# Patient Record
Sex: Female | Born: 1935 | Race: White | Hispanic: No | Smoking: Never smoker
Health system: Southern US, Community
[De-identification: ages and names within clinical notes are randomized; demographics above are authoritative.]

## PROBLEM LIST (undated history)

## (undated) DIAGNOSIS — Z972 Presence of dental prosthetic device (complete) (partial): Secondary | ICD-10-CM

## (undated) DIAGNOSIS — C801 Malignant (primary) neoplasm, unspecified: Secondary | ICD-10-CM

## (undated) DIAGNOSIS — K219 Gastro-esophageal reflux disease without esophagitis: Secondary | ICD-10-CM

## (undated) DIAGNOSIS — I639 Cerebral infarction, unspecified: Secondary | ICD-10-CM

## (undated) DIAGNOSIS — E119 Type 2 diabetes mellitus without complications: Secondary | ICD-10-CM

---

## 2014-08-29 LAB — COMPREHENSIVE METABOLIC PANEL
ALK PHOS: 150 U/L — AB
Albumin: 3.4 g/dL (ref 3.4–5.0)
Anion Gap: 9 (ref 7–16)
BUN: 20 mg/dL — ABNORMAL HIGH (ref 7–18)
Bilirubin,Total: 0.4 mg/dL (ref 0.2–1.0)
CALCIUM: 9 mg/dL (ref 8.5–10.1)
Chloride: 96 mmol/L — ABNORMAL LOW (ref 98–107)
Co2: 29 mmol/L (ref 21–32)
Creatinine: 1.22 mg/dL (ref 0.60–1.30)
EGFR (Non-African Amer.): 45 — ABNORMAL LOW
GFR CALC AF AMER: 55 — AB
Glucose: 281 mg/dL — ABNORMAL HIGH (ref 65–99)
Osmolality: 281 (ref 275–301)
Potassium: 3.5 mmol/L (ref 3.5–5.1)
SGOT(AST): 21 U/L (ref 15–37)
SGPT (ALT): 22 U/L
SODIUM: 134 mmol/L — AB (ref 136–145)
Total Protein: 7.8 g/dL (ref 6.4–8.2)

## 2014-08-29 LAB — CBC WITH DIFFERENTIAL/PLATELET
Basophil #: 0 10*3/uL (ref 0.0–0.1)
Basophil %: 0.4 %
EOS ABS: 0 10*3/uL (ref 0.0–0.7)
Eosinophil %: 0.5 %
HCT: 44.8 % (ref 35.0–47.0)
HGB: 14.7 g/dL (ref 12.0–16.0)
LYMPHS PCT: 11.7 %
Lymphocyte #: 0.9 10*3/uL — ABNORMAL LOW (ref 1.0–3.6)
MCH: 29.1 pg (ref 26.0–34.0)
MCHC: 32.8 g/dL (ref 32.0–36.0)
MCV: 89 fL (ref 80–100)
MONOS PCT: 3.9 %
Monocyte #: 0.3 x10 3/mm (ref 0.2–0.9)
NEUTROS ABS: 6.8 10*3/uL — AB (ref 1.4–6.5)
NEUTROS PCT: 83.5 %
PLATELETS: 230 10*3/uL (ref 150–440)
RBC: 5.05 10*6/uL (ref 3.80–5.20)
RDW: 14.8 % — AB (ref 11.5–14.5)
WBC: 8.1 10*3/uL (ref 3.6–11.0)

## 2014-08-29 LAB — URINALYSIS, COMPLETE
BACTERIA: NONE SEEN
Bilirubin,UR: NEGATIVE
Blood: NEGATIVE
Leukocyte Esterase: NEGATIVE
Nitrite: NEGATIVE
Ph: 7 (ref 4.5–8.0)
RBC,UR: 3 /HPF (ref 0–5)
SQUAMOUS EPITHELIAL: NONE SEEN
Specific Gravity: 1.012 (ref 1.003–1.030)

## 2014-08-29 LAB — LIPASE, BLOOD: Lipase: 202 U/L (ref 73–393)

## 2014-08-29 LAB — TROPONIN I

## 2014-08-30 ENCOUNTER — Inpatient Hospital Stay: Payer: Self-pay | Admitting: Internal Medicine

## 2014-08-30 LAB — CBC WITH DIFFERENTIAL/PLATELET
Basophil #: 0 10*3/uL (ref 0.0–0.1)
Basophil %: 0.7 %
Eosinophil #: 0.1 10*3/uL (ref 0.0–0.7)
Eosinophil %: 0.9 %
HCT: 41.2 % (ref 35.0–47.0)
HGB: 13.7 g/dL (ref 12.0–16.0)
LYMPHS ABS: 1.7 10*3/uL (ref 1.0–3.6)
Lymphocyte %: 23.5 %
MCH: 29.3 pg (ref 26.0–34.0)
MCHC: 33.3 g/dL (ref 32.0–36.0)
MCV: 88 fL (ref 80–100)
Monocyte #: 0.6 x10 3/mm (ref 0.2–0.9)
Monocyte %: 8.7 %
NEUTROS ABS: 4.9 10*3/uL (ref 1.4–6.5)
Neutrophil %: 66.2 %
PLATELETS: 270 10*3/uL (ref 150–440)
RBC: 4.67 10*6/uL (ref 3.80–5.20)
RDW: 15 % — AB (ref 11.5–14.5)
WBC: 7.5 10*3/uL (ref 3.6–11.0)

## 2014-08-30 LAB — BASIC METABOLIC PANEL
Anion Gap: 10 (ref 7–16)
BUN: 19 mg/dL — AB (ref 7–18)
CALCIUM: 8.3 mg/dL — AB (ref 8.5–10.1)
CO2: 27 mmol/L (ref 21–32)
Chloride: 101 mmol/L (ref 98–107)
Creatinine: 1.25 mg/dL (ref 0.60–1.30)
EGFR (African American): 53 — ABNORMAL LOW
EGFR (Non-African Amer.): 44 — ABNORMAL LOW
Glucose: 232 mg/dL — ABNORMAL HIGH (ref 65–99)
OSMOLALITY: 285 (ref 275–301)
POTASSIUM: 3.5 mmol/L (ref 3.5–5.1)
SODIUM: 138 mmol/L (ref 136–145)

## 2014-09-04 LAB — CULTURE, BLOOD (SINGLE)

## 2015-03-13 NOTE — H&P (Signed)
PATIENT NAME:  Carrie Craig, Carrie Craig MR#:  [id number] DATE OF BIRTH:  [birth year]  DATE OF ADMISSION:  08/29/2014  REFERRING PHYSICIAN: Dr. Jacqualine Code.   FAMILY PHYSICIAN: Unknown.   REASON FOR ADMISSION: Altered mental status.   HISTORY OF PRESENT ILLNESS: The patient is an 81 year old female who presents after being dropped off by family for acute altered mental status. No family is available at this time and the patient is confused and unable to give a history. She apparently has a history of hypertension and diabetes, and was in her usual state of health until this morning. She was brought for nausea, vomiting, and confusion. In the Emergency Room, the patient is acutely confused, but in no acute distress. No vomiting noted. Initial head CT and labs were nondiagnostic. She is now admitted for further evaluation.   PAST MEDICAL HISTORY: 1.  Benign hypertension.  2.  Type 2 diabetes.   MEDICATIONS: Unknown.   ALLERGIES: Unknown.   SOCIAL HISTORY: Unknown.   FAMILY HISTORY: Unknown.   REVIEW OF SYSTEMS: Unable to obtain.   PHYSICAL EXAMINATION: GENERAL: The patient is elderly, confused, but in no acute distress.  VITAL SIGNS: Currently remarkable for a blood pressure of 179/88, heart rate 92, respiratory rate of 24, temperature of 98, saturations 96% on room air.  HEENT: Normocephalic, atraumatic. Pupils equally round and reactive to light and accommodation. Extraocular movements are intact. Sclerae are anicteric. Conjunctivae are clear. Oropharynx is clear.  NECK: Supple without JVD. No adenopathy or thyromegaly is noted.  LUNGS: Clear to auscultation and percussion without wheezes, rales or rhonchi. No dullness. Respiratory effort is normal.  CARDIAC: Regular rate and rhythm with normal S1, S2. No significant rubs, murmurs or gallops. PMI is nondisplaced. Chest wall is nontender.  ABDOMEN: Soft, nontender, with normoactive bowel sounds. No organomegaly or masses were appreciated. No hernias or  bruits were noted.  EXTREMITIES: Without clubbing, cyanosis or edema. Pulses were 2+ bilaterally.  SKIN: Warm and dry without rash or lesions.  NEUROLOGIC: Cranial nerves II through XII grossly intact. Deep tendon reflexes were symmetric. Motor and sensory exam nonfocal.  PSYCHIATRIC: The patient was alert, but disoriented to person, place, and time.   LABORATORY DATA: Glucose 281 with a BUN of 20, creatinine 1.22 with a sodium of 134, potassium 3.5 and a lipase of 202. White count 8.1 with a hemoglobin of 14.7. Plain films of the abdomen were unremarkable. CT scan of the head revealed no acute abnormality.   ASSESSMENT: 1.  Acute mental status changes, worrisome for transient ischemic attack versus stroke.  2.  Benign hypertension.  3.  Type 2 diabetes.  4.  Dehydration.   PLAN: The patient will be observed on telemetry with aspirin and Lovenox. Will obtain neurology consult and MRI of the brain. Neuro checks q. 4 hours. We will follow her sugars closely. We will send off a urine and a urine culture. Begin empiric IV antibiotics with IV fluids. Follow up routine labs in the morning. Further treatment and evaluation will depend upon the patient's progress.   TOTAL TIME SPENT ON THIS PATIENT: 45 minutes.    ____________________________ Leonie Douglas Doy Hutching, MD jds:at D: 08/29/2014 18:43:08 ET T: 08/29/2014 19:02:17 ET JOB#: [id number]  cc: Leonie Douglas. Doy Hutching, MD, <Dictator> Nigel Ericsson Lennice Sites MD ELECTRONICALLY SIGNED 08/30/2014 10:28

## 2015-03-13 NOTE — Discharge Summary (Signed)
PATIENT NAME:  Carrie Craig, Carrie Craig MR#:  [id number] DATE OF BIRTH:  Nov 05, 1932  DATE OF ADMISSION:  08/30/2014 DATE OF DISCHARGE:  08/31/2014  PRIMARY CARE PHYSICIAN: None local.  DISCHARGE DIAGNOSES: 1. Altered mental status possibly medication induced.  2. Dehydration.  3. Hypertension.  4. Diabetes 2.   CONDITION: Stable.   CODE STATUS: Full Code.   HOME MEDICATIONS: Please refer to the medication reconciliation list. Tramadol was discontinued.   DIET: Low-sodium, low-fat, low-cholesterol, ADA diet.   ACTIVITY: As tolerated.   FOLLOW-UP CARE: Follow up with PCP within 1 to 2 weeks.   REASON FOR ADMISSION: Altered mental status.   HOSPITAL COURSE: The patient is an 81 year old female with a history of hypertension, diabetes 2, who was sent to the ED due to confusion and altered mental status. The patient was in her usual state of health. In addition, the patient had nausea and vomiting. The patient's CAT scan of the head was nondiagnostic. For detailed history and physical examination, please refer to the admission note dictated by Dr. Doy Hutching.  On admission date, the patient's glucose was 281, BUN 20, creatinine 1.22, sodium 134, potassium 3.5. WBC 8.1, hemoglobin 14.7. CAT scan of head: No acute abnormality. The patient was admitted for altered mental status worrisome for TIA versus a stroke. After admission, the patient has been treated with aspirin and a statin. We repeated an MRI, which was negative for CVA.   The patient's mental status has much improved. Today she is alert, awake, oriented, in no acute distress. Physical examinations did not show any intracranial deficit. The patient said she is taking Tramadol. I checked the patient's medication list. In addition to tramadol, the patient also is taking citalopram. The combination of the 2 medications can cause altered mental status, confusion, nausea and vomiting. The patient's altered mental status is highly possibly due to  medication, so I advised the patient to avoid tramadol.   Diabetes. The patient's diabetes has been treated with sliding scale and Levemir. The patient needs to resume glipizide that after discharge. She needs to follow up with PCP to adjust the diabetes medication to control the blood sugar.   The patient has no complaints. Vital signs are stable. She is clinically stable and will be discharged to home today.   I discussed the patient's discharge plan with the patient, nurse and case manager.   TIME SPENT: About 35 minutes.    ____________________________ Demetrios Loll, MD qc:JT D: 08/31/2014 11:01:49 ET T: 08/31/2014 11:46:43 ET JOB#: [id number]  cc: Demetrios Loll, MD, <Dictator> Demetrios Loll MD ELECTRONICALLY SIGNED 08/31/2014 13:18

## 2015-07-22 DIAGNOSIS — E1165 Type 2 diabetes mellitus with hyperglycemia: Secondary | ICD-10-CM | POA: Diagnosis not present

## 2015-07-28 DIAGNOSIS — E876 Hypokalemia: Secondary | ICD-10-CM | POA: Diagnosis not present

## 2015-07-28 DIAGNOSIS — R410 Disorientation, unspecified: Secondary | ICD-10-CM | POA: Diagnosis not present

## 2015-07-28 DIAGNOSIS — R404 Transient alteration of awareness: Secondary | ICD-10-CM | POA: Diagnosis not present

## 2015-07-28 DIAGNOSIS — R402361 Coma scale, best motor response, obeys commands, in the field [EMT or ambulance]: Secondary | ICD-10-CM | POA: Diagnosis not present

## 2015-07-28 DIAGNOSIS — R402141 Coma scale, eyes open, spontaneous, in the field [EMT or ambulance]: Secondary | ICD-10-CM | POA: Diagnosis not present

## 2015-07-28 DIAGNOSIS — G9340 Encephalopathy, unspecified: Secondary | ICD-10-CM | POA: Diagnosis not present

## 2015-07-28 DIAGNOSIS — R739 Hyperglycemia, unspecified: Secondary | ICD-10-CM | POA: Diagnosis not present

## 2015-07-28 DIAGNOSIS — R402241 Coma scale, best verbal response, confused conversation, in the field [EMT or ambulance]: Secondary | ICD-10-CM | POA: Diagnosis not present

## 2015-07-28 DIAGNOSIS — E1165 Type 2 diabetes mellitus with hyperglycemia: Secondary | ICD-10-CM | POA: Diagnosis not present

## 2015-07-29 DIAGNOSIS — N184 Chronic kidney disease, stage 4 (severe): Secondary | ICD-10-CM | POA: Diagnosis not present

## 2015-07-29 DIAGNOSIS — F0390 Unspecified dementia without behavioral disturbance: Secondary | ICD-10-CM | POA: Diagnosis not present

## 2015-07-29 DIAGNOSIS — E1165 Type 2 diabetes mellitus with hyperglycemia: Secondary | ICD-10-CM | POA: Diagnosis not present

## 2015-07-29 DIAGNOSIS — M1990 Unspecified osteoarthritis, unspecified site: Secondary | ICD-10-CM | POA: Diagnosis not present

## 2015-07-29 DIAGNOSIS — I340 Nonrheumatic mitral (valve) insufficiency: Secondary | ICD-10-CM | POA: Diagnosis not present

## 2015-07-29 DIAGNOSIS — R509 Fever, unspecified: Secondary | ICD-10-CM | POA: Diagnosis not present

## 2015-07-29 DIAGNOSIS — R404 Transient alteration of awareness: Secondary | ICD-10-CM | POA: Diagnosis not present

## 2015-07-29 DIAGNOSIS — I679 Cerebrovascular disease, unspecified: Secondary | ICD-10-CM | POA: Diagnosis not present

## 2015-07-29 DIAGNOSIS — R54 Age-related physical debility: Secondary | ICD-10-CM | POA: Diagnosis not present

## 2015-07-29 DIAGNOSIS — R7989 Other specified abnormal findings of blood chemistry: Secondary | ICD-10-CM | POA: Diagnosis not present

## 2015-07-29 DIAGNOSIS — I6990 Unspecified sequelae of unspecified cerebrovascular disease: Secondary | ICD-10-CM | POA: Diagnosis not present

## 2015-07-29 DIAGNOSIS — R1310 Dysphagia, unspecified: Secondary | ICD-10-CM | POA: Diagnosis not present

## 2015-07-29 DIAGNOSIS — R4182 Altered mental status, unspecified: Secondary | ICD-10-CM | POA: Diagnosis not present

## 2015-07-29 DIAGNOSIS — R279 Unspecified lack of coordination: Secondary | ICD-10-CM | POA: Diagnosis not present

## 2015-07-29 DIAGNOSIS — E782 Mixed hyperlipidemia: Secondary | ICD-10-CM | POA: Diagnosis not present

## 2015-07-29 DIAGNOSIS — R0602 Shortness of breath: Secondary | ICD-10-CM | POA: Diagnosis not present

## 2015-07-29 DIAGNOSIS — M62838 Other muscle spasm: Secondary | ICD-10-CM | POA: Diagnosis not present

## 2015-07-29 DIAGNOSIS — R410 Disorientation, unspecified: Secondary | ICD-10-CM | POA: Diagnosis not present

## 2015-07-29 DIAGNOSIS — R1312 Dysphagia, oropharyngeal phase: Secondary | ICD-10-CM | POA: Diagnosis not present

## 2015-07-29 DIAGNOSIS — K219 Gastro-esophageal reflux disease without esophagitis: Secondary | ICD-10-CM | POA: Diagnosis not present

## 2015-07-29 DIAGNOSIS — Z8673 Personal history of transient ischemic attack (TIA), and cerebral infarction without residual deficits: Secondary | ICD-10-CM | POA: Diagnosis not present

## 2015-07-29 DIAGNOSIS — G9340 Encephalopathy, unspecified: Secondary | ICD-10-CM | POA: Diagnosis not present

## 2015-07-29 DIAGNOSIS — E876 Hypokalemia: Secondary | ICD-10-CM | POA: Diagnosis not present

## 2015-07-29 DIAGNOSIS — F339 Major depressive disorder, recurrent, unspecified: Secondary | ICD-10-CM | POA: Diagnosis not present

## 2015-07-29 DIAGNOSIS — I639 Cerebral infarction, unspecified: Secondary | ICD-10-CM | POA: Diagnosis not present

## 2015-07-29 DIAGNOSIS — I10 Essential (primary) hypertension: Secondary | ICD-10-CM | POA: Diagnosis not present

## 2015-07-29 DIAGNOSIS — N179 Acute kidney failure, unspecified: Secondary | ICD-10-CM | POA: Diagnosis not present

## 2015-07-29 DIAGNOSIS — M6281 Muscle weakness (generalized): Secondary | ICD-10-CM | POA: Diagnosis not present

## 2015-07-29 DIAGNOSIS — Z7401 Bed confinement status: Secondary | ICD-10-CM | POA: Diagnosis not present

## 2015-07-29 DIAGNOSIS — H547 Unspecified visual loss: Secondary | ICD-10-CM | POA: Diagnosis not present

## 2015-07-29 DIAGNOSIS — R41841 Cognitive communication deficit: Secondary | ICD-10-CM | POA: Diagnosis not present

## 2015-07-29 DIAGNOSIS — F418 Other specified anxiety disorders: Secondary | ICD-10-CM | POA: Diagnosis not present

## 2015-07-29 DIAGNOSIS — G459 Transient cerebral ischemic attack, unspecified: Secondary | ICD-10-CM | POA: Diagnosis not present

## 2015-07-29 DIAGNOSIS — E119 Type 2 diabetes mellitus without complications: Secondary | ICD-10-CM | POA: Diagnosis not present

## 2015-07-29 DIAGNOSIS — R739 Hyperglycemia, unspecified: Secondary | ICD-10-CM | POA: Diagnosis not present

## 2015-07-30 DIAGNOSIS — Z8673 Personal history of transient ischemic attack (TIA), and cerebral infarction without residual deficits: Secondary | ICD-10-CM | POA: Diagnosis not present

## 2015-07-30 DIAGNOSIS — G9340 Encephalopathy, unspecified: Secondary | ICD-10-CM | POA: Diagnosis not present

## 2015-07-30 DIAGNOSIS — R739 Hyperglycemia, unspecified: Secondary | ICD-10-CM | POA: Diagnosis not present

## 2015-07-30 DIAGNOSIS — R4182 Altered mental status, unspecified: Secondary | ICD-10-CM | POA: Diagnosis not present

## 2015-07-30 DIAGNOSIS — E1165 Type 2 diabetes mellitus with hyperglycemia: Secondary | ICD-10-CM | POA: Diagnosis not present

## 2015-07-30 DIAGNOSIS — R410 Disorientation, unspecified: Secondary | ICD-10-CM | POA: Diagnosis not present

## 2015-07-31 DIAGNOSIS — R410 Disorientation, unspecified: Secondary | ICD-10-CM | POA: Diagnosis not present

## 2015-07-31 DIAGNOSIS — G9340 Encephalopathy, unspecified: Secondary | ICD-10-CM | POA: Diagnosis not present

## 2015-07-31 DIAGNOSIS — R739 Hyperglycemia, unspecified: Secondary | ICD-10-CM | POA: Diagnosis not present

## 2015-07-31 DIAGNOSIS — E1165 Type 2 diabetes mellitus with hyperglycemia: Secondary | ICD-10-CM | POA: Diagnosis not present

## 2015-07-31 DIAGNOSIS — Z8673 Personal history of transient ischemic attack (TIA), and cerebral infarction without residual deficits: Secondary | ICD-10-CM | POA: Diagnosis not present

## 2015-08-01 DIAGNOSIS — Z8673 Personal history of transient ischemic attack (TIA), and cerebral infarction without residual deficits: Secondary | ICD-10-CM | POA: Diagnosis not present

## 2015-08-01 DIAGNOSIS — G9340 Encephalopathy, unspecified: Secondary | ICD-10-CM | POA: Diagnosis not present

## 2015-08-01 DIAGNOSIS — E1165 Type 2 diabetes mellitus with hyperglycemia: Secondary | ICD-10-CM | POA: Diagnosis not present

## 2015-08-01 DIAGNOSIS — R739 Hyperglycemia, unspecified: Secondary | ICD-10-CM | POA: Diagnosis not present

## 2015-08-01 DIAGNOSIS — R410 Disorientation, unspecified: Secondary | ICD-10-CM | POA: Diagnosis not present

## 2015-08-02 DIAGNOSIS — G9340 Encephalopathy, unspecified: Secondary | ICD-10-CM | POA: Diagnosis not present

## 2015-08-02 DIAGNOSIS — R4182 Altered mental status, unspecified: Secondary | ICD-10-CM | POA: Diagnosis not present

## 2015-08-02 DIAGNOSIS — G459 Transient cerebral ischemic attack, unspecified: Secondary | ICD-10-CM | POA: Diagnosis not present

## 2015-08-02 DIAGNOSIS — R410 Disorientation, unspecified: Secondary | ICD-10-CM | POA: Diagnosis not present

## 2015-08-03 DIAGNOSIS — I340 Nonrheumatic mitral (valve) insufficiency: Secondary | ICD-10-CM | POA: Diagnosis not present

## 2015-08-03 DIAGNOSIS — R1310 Dysphagia, unspecified: Secondary | ICD-10-CM | POA: Diagnosis not present

## 2015-08-05 DIAGNOSIS — R0602 Shortness of breath: Secondary | ICD-10-CM | POA: Diagnosis not present

## 2015-08-06 DIAGNOSIS — F0390 Unspecified dementia without behavioral disturbance: Secondary | ICD-10-CM | POA: Diagnosis not present

## 2015-08-06 DIAGNOSIS — E1165 Type 2 diabetes mellitus with hyperglycemia: Secondary | ICD-10-CM | POA: Diagnosis not present

## 2015-08-06 DIAGNOSIS — R630 Anorexia: Secondary | ICD-10-CM | POA: Diagnosis not present

## 2015-08-06 DIAGNOSIS — E782 Mixed hyperlipidemia: Secondary | ICD-10-CM | POA: Diagnosis not present

## 2015-08-06 DIAGNOSIS — R1312 Dysphagia, oropharyngeal phase: Secondary | ICD-10-CM | POA: Diagnosis not present

## 2015-08-06 DIAGNOSIS — I6990 Unspecified sequelae of unspecified cerebrovascular disease: Secondary | ICD-10-CM | POA: Diagnosis not present

## 2015-08-06 DIAGNOSIS — R279 Unspecified lack of coordination: Secondary | ICD-10-CM | POA: Diagnosis not present

## 2015-08-06 DIAGNOSIS — E119 Type 2 diabetes mellitus without complications: Secondary | ICD-10-CM | POA: Diagnosis not present

## 2015-08-06 DIAGNOSIS — E876 Hypokalemia: Secondary | ICD-10-CM | POA: Diagnosis not present

## 2015-08-06 DIAGNOSIS — I10 Essential (primary) hypertension: Secondary | ICD-10-CM | POA: Diagnosis not present

## 2015-08-06 DIAGNOSIS — R7989 Other specified abnormal findings of blood chemistry: Secondary | ICD-10-CM | POA: Diagnosis not present

## 2015-08-06 DIAGNOSIS — N184 Chronic kidney disease, stage 4 (severe): Secondary | ICD-10-CM | POA: Diagnosis not present

## 2015-08-06 DIAGNOSIS — M6281 Muscle weakness (generalized): Secondary | ICD-10-CM | POA: Diagnosis not present

## 2015-08-06 DIAGNOSIS — R54 Age-related physical debility: Secondary | ICD-10-CM | POA: Diagnosis not present

## 2015-08-06 DIAGNOSIS — F39 Unspecified mood [affective] disorder: Secondary | ICD-10-CM | POA: Diagnosis not present

## 2015-08-06 DIAGNOSIS — Z7401 Bed confinement status: Secondary | ICD-10-CM | POA: Diagnosis not present

## 2015-08-06 DIAGNOSIS — Z5181 Encounter for therapeutic drug level monitoring: Secondary | ICD-10-CM | POA: Diagnosis not present

## 2015-08-06 DIAGNOSIS — F339 Major depressive disorder, recurrent, unspecified: Secondary | ICD-10-CM | POA: Diagnosis not present

## 2015-08-06 DIAGNOSIS — K219 Gastro-esophageal reflux disease without esophagitis: Secondary | ICD-10-CM | POA: Diagnosis not present

## 2015-08-06 DIAGNOSIS — G9340 Encephalopathy, unspecified: Secondary | ICD-10-CM | POA: Diagnosis not present

## 2015-08-06 DIAGNOSIS — M62838 Other muscle spasm: Secondary | ICD-10-CM | POA: Diagnosis not present

## 2015-08-06 DIAGNOSIS — N179 Acute kidney failure, unspecified: Secondary | ICD-10-CM | POA: Diagnosis not present

## 2015-08-06 DIAGNOSIS — F419 Anxiety disorder, unspecified: Secondary | ICD-10-CM | POA: Diagnosis not present

## 2015-08-06 DIAGNOSIS — R41841 Cognitive communication deficit: Secondary | ICD-10-CM | POA: Diagnosis not present

## 2015-08-06 DIAGNOSIS — F418 Other specified anxiety disorders: Secondary | ICD-10-CM | POA: Diagnosis not present

## 2015-08-06 DIAGNOSIS — I679 Cerebrovascular disease, unspecified: Secondary | ICD-10-CM | POA: Diagnosis not present

## 2015-08-06 DIAGNOSIS — F320 Major depressive disorder, single episode, mild: Secondary | ICD-10-CM | POA: Diagnosis not present

## 2015-08-20 DIAGNOSIS — N184 Chronic kidney disease, stage 4 (severe): Secondary | ICD-10-CM | POA: Diagnosis not present

## 2015-08-20 DIAGNOSIS — F418 Other specified anxiety disorders: Secondary | ICD-10-CM | POA: Diagnosis not present

## 2015-08-20 DIAGNOSIS — K219 Gastro-esophageal reflux disease without esophagitis: Secondary | ICD-10-CM | POA: Diagnosis not present

## 2015-08-20 DIAGNOSIS — I6990 Unspecified sequelae of unspecified cerebrovascular disease: Secondary | ICD-10-CM | POA: Diagnosis not present

## 2015-08-20 DIAGNOSIS — I10 Essential (primary) hypertension: Secondary | ICD-10-CM | POA: Diagnosis not present

## 2015-08-20 DIAGNOSIS — E119 Type 2 diabetes mellitus without complications: Secondary | ICD-10-CM | POA: Diagnosis not present

## 2015-08-27 DIAGNOSIS — N179 Acute kidney failure, unspecified: Secondary | ICD-10-CM | POA: Diagnosis not present

## 2015-08-27 DIAGNOSIS — F0390 Unspecified dementia without behavioral disturbance: Secondary | ICD-10-CM | POA: Diagnosis not present

## 2015-08-27 DIAGNOSIS — R630 Anorexia: Secondary | ICD-10-CM | POA: Diagnosis not present

## 2015-09-03 DIAGNOSIS — I10 Essential (primary) hypertension: Secondary | ICD-10-CM | POA: Diagnosis not present

## 2015-09-03 DIAGNOSIS — I6990 Unspecified sequelae of unspecified cerebrovascular disease: Secondary | ICD-10-CM | POA: Diagnosis not present

## 2015-09-03 DIAGNOSIS — F418 Other specified anxiety disorders: Secondary | ICD-10-CM | POA: Diagnosis not present

## 2015-09-11 DIAGNOSIS — Z9181 History of falling: Secondary | ICD-10-CM | POA: Diagnosis not present

## 2015-09-11 DIAGNOSIS — I6930 Unspecified sequelae of cerebral infarction: Secondary | ICD-10-CM | POA: Diagnosis not present

## 2015-09-11 DIAGNOSIS — Z7901 Long term (current) use of anticoagulants: Secondary | ICD-10-CM | POA: Diagnosis not present

## 2015-09-11 DIAGNOSIS — M6281 Muscle weakness (generalized): Secondary | ICD-10-CM | POA: Diagnosis not present

## 2015-09-11 DIAGNOSIS — E1165 Type 2 diabetes mellitus with hyperglycemia: Secondary | ICD-10-CM | POA: Diagnosis not present

## 2015-09-11 DIAGNOSIS — I10 Essential (primary) hypertension: Secondary | ICD-10-CM | POA: Diagnosis not present

## 2015-09-11 DIAGNOSIS — R2689 Other abnormalities of gait and mobility: Secondary | ICD-10-CM | POA: Diagnosis not present

## 2015-09-11 DIAGNOSIS — Z794 Long term (current) use of insulin: Secondary | ICD-10-CM | POA: Diagnosis not present

## 2015-09-11 DIAGNOSIS — F339 Major depressive disorder, recurrent, unspecified: Secondary | ICD-10-CM | POA: Diagnosis not present

## 2015-09-13 DIAGNOSIS — F339 Major depressive disorder, recurrent, unspecified: Secondary | ICD-10-CM | POA: Diagnosis not present

## 2015-09-13 DIAGNOSIS — I10 Essential (primary) hypertension: Secondary | ICD-10-CM | POA: Diagnosis not present

## 2015-09-13 DIAGNOSIS — R2689 Other abnormalities of gait and mobility: Secondary | ICD-10-CM | POA: Diagnosis not present

## 2015-09-13 DIAGNOSIS — Z7901 Long term (current) use of anticoagulants: Secondary | ICD-10-CM | POA: Diagnosis not present

## 2015-09-13 DIAGNOSIS — M6281 Muscle weakness (generalized): Secondary | ICD-10-CM | POA: Diagnosis not present

## 2015-09-13 DIAGNOSIS — Z9181 History of falling: Secondary | ICD-10-CM | POA: Diagnosis not present

## 2015-09-13 DIAGNOSIS — E1165 Type 2 diabetes mellitus with hyperglycemia: Secondary | ICD-10-CM | POA: Diagnosis not present

## 2015-09-13 DIAGNOSIS — I6930 Unspecified sequelae of cerebral infarction: Secondary | ICD-10-CM | POA: Diagnosis not present

## 2015-09-13 DIAGNOSIS — Z794 Long term (current) use of insulin: Secondary | ICD-10-CM | POA: Diagnosis not present

## 2015-09-15 DIAGNOSIS — I6930 Unspecified sequelae of cerebral infarction: Secondary | ICD-10-CM | POA: Diagnosis not present

## 2015-09-15 DIAGNOSIS — Z7901 Long term (current) use of anticoagulants: Secondary | ICD-10-CM | POA: Diagnosis not present

## 2015-09-15 DIAGNOSIS — Z9181 History of falling: Secondary | ICD-10-CM | POA: Diagnosis not present

## 2015-09-15 DIAGNOSIS — I10 Essential (primary) hypertension: Secondary | ICD-10-CM | POA: Diagnosis not present

## 2015-09-15 DIAGNOSIS — R2689 Other abnormalities of gait and mobility: Secondary | ICD-10-CM | POA: Diagnosis not present

## 2015-09-15 DIAGNOSIS — M6281 Muscle weakness (generalized): Secondary | ICD-10-CM | POA: Diagnosis not present

## 2015-09-15 DIAGNOSIS — E1165 Type 2 diabetes mellitus with hyperglycemia: Secondary | ICD-10-CM | POA: Diagnosis not present

## 2015-09-15 DIAGNOSIS — F339 Major depressive disorder, recurrent, unspecified: Secondary | ICD-10-CM | POA: Diagnosis not present

## 2015-09-15 DIAGNOSIS — Z794 Long term (current) use of insulin: Secondary | ICD-10-CM | POA: Diagnosis not present

## 2015-09-16 DIAGNOSIS — R2689 Other abnormalities of gait and mobility: Secondary | ICD-10-CM | POA: Diagnosis not present

## 2015-09-16 DIAGNOSIS — Z7901 Long term (current) use of anticoagulants: Secondary | ICD-10-CM | POA: Diagnosis not present

## 2015-09-16 DIAGNOSIS — M6281 Muscle weakness (generalized): Secondary | ICD-10-CM | POA: Diagnosis not present

## 2015-09-16 DIAGNOSIS — E1165 Type 2 diabetes mellitus with hyperglycemia: Secondary | ICD-10-CM | POA: Diagnosis not present

## 2015-09-16 DIAGNOSIS — I10 Essential (primary) hypertension: Secondary | ICD-10-CM | POA: Diagnosis not present

## 2015-09-16 DIAGNOSIS — Z794 Long term (current) use of insulin: Secondary | ICD-10-CM | POA: Diagnosis not present

## 2015-09-16 DIAGNOSIS — I6930 Unspecified sequelae of cerebral infarction: Secondary | ICD-10-CM | POA: Diagnosis not present

## 2015-09-16 DIAGNOSIS — Z9181 History of falling: Secondary | ICD-10-CM | POA: Diagnosis not present

## 2015-09-16 DIAGNOSIS — F339 Major depressive disorder, recurrent, unspecified: Secondary | ICD-10-CM | POA: Diagnosis not present

## 2015-09-20 DIAGNOSIS — R2689 Other abnormalities of gait and mobility: Secondary | ICD-10-CM | POA: Diagnosis not present

## 2015-09-20 DIAGNOSIS — M6281 Muscle weakness (generalized): Secondary | ICD-10-CM | POA: Diagnosis not present

## 2015-09-20 DIAGNOSIS — Z9181 History of falling: Secondary | ICD-10-CM | POA: Diagnosis not present

## 2015-09-20 DIAGNOSIS — Z7901 Long term (current) use of anticoagulants: Secondary | ICD-10-CM | POA: Diagnosis not present

## 2015-09-20 DIAGNOSIS — E1165 Type 2 diabetes mellitus with hyperglycemia: Secondary | ICD-10-CM | POA: Diagnosis not present

## 2015-09-20 DIAGNOSIS — I10 Essential (primary) hypertension: Secondary | ICD-10-CM | POA: Diagnosis not present

## 2015-09-20 DIAGNOSIS — I6930 Unspecified sequelae of cerebral infarction: Secondary | ICD-10-CM | POA: Diagnosis not present

## 2015-09-20 DIAGNOSIS — Z794 Long term (current) use of insulin: Secondary | ICD-10-CM | POA: Diagnosis not present

## 2015-09-20 DIAGNOSIS — F339 Major depressive disorder, recurrent, unspecified: Secondary | ICD-10-CM | POA: Diagnosis not present

## 2015-09-21 DIAGNOSIS — Z7901 Long term (current) use of anticoagulants: Secondary | ICD-10-CM | POA: Diagnosis not present

## 2015-09-21 DIAGNOSIS — Z794 Long term (current) use of insulin: Secondary | ICD-10-CM | POA: Diagnosis not present

## 2015-09-21 DIAGNOSIS — Z9181 History of falling: Secondary | ICD-10-CM | POA: Diagnosis not present

## 2015-09-21 DIAGNOSIS — R2689 Other abnormalities of gait and mobility: Secondary | ICD-10-CM | POA: Diagnosis not present

## 2015-09-21 DIAGNOSIS — M6281 Muscle weakness (generalized): Secondary | ICD-10-CM | POA: Diagnosis not present

## 2015-09-21 DIAGNOSIS — E1165 Type 2 diabetes mellitus with hyperglycemia: Secondary | ICD-10-CM | POA: Diagnosis not present

## 2015-09-21 DIAGNOSIS — I10 Essential (primary) hypertension: Secondary | ICD-10-CM | POA: Diagnosis not present

## 2015-09-21 DIAGNOSIS — I6930 Unspecified sequelae of cerebral infarction: Secondary | ICD-10-CM | POA: Diagnosis not present

## 2015-09-21 DIAGNOSIS — F339 Major depressive disorder, recurrent, unspecified: Secondary | ICD-10-CM | POA: Diagnosis not present

## 2015-09-22 ENCOUNTER — Encounter: Payer: Self-pay | Admitting: Emergency Medicine

## 2015-09-22 ENCOUNTER — Emergency Department: Payer: Commercial Managed Care - HMO

## 2015-09-22 ENCOUNTER — Emergency Department
Admission: EM | Admit: 2015-09-22 | Discharge: 2015-09-22 | Disposition: A | Discharge status: Home / Self Care | Payer: Commercial Managed Care - HMO | Attending: Emergency Medicine | Admitting: Emergency Medicine

## 2015-09-22 DIAGNOSIS — W01198A Fall on same level from slipping, tripping and stumbling with subsequent striking against other object, initial encounter: Secondary | ICD-10-CM | POA: Insufficient documentation

## 2015-09-22 DIAGNOSIS — S42492A Other displaced fracture of lower end of left humerus, initial encounter for closed fracture: Secondary | ICD-10-CM | POA: Diagnosis not present

## 2015-09-22 DIAGNOSIS — Y9289 Other specified places as the place of occurrence of the external cause: Secondary | ICD-10-CM | POA: Insufficient documentation

## 2015-09-22 DIAGNOSIS — S42402A Unspecified fracture of lower end of left humerus, initial encounter for closed fracture: Secondary | ICD-10-CM

## 2015-09-22 DIAGNOSIS — Y998 Other external cause status: Secondary | ICD-10-CM | POA: Diagnosis not present

## 2015-09-22 DIAGNOSIS — S62606A Fracture of unspecified phalanx of right little finger, initial encounter for closed fracture: Secondary | ICD-10-CM | POA: Diagnosis not present

## 2015-09-22 DIAGNOSIS — S5002XA Contusion of left elbow, initial encounter: Secondary | ICD-10-CM | POA: Insufficient documentation

## 2015-09-22 DIAGNOSIS — S60051A Contusion of right little finger without damage to nail, initial encounter: Secondary | ICD-10-CM | POA: Insufficient documentation

## 2015-09-22 DIAGNOSIS — Y9389 Activity, other specified: Secondary | ICD-10-CM | POA: Insufficient documentation

## 2015-09-22 DIAGNOSIS — E119 Type 2 diabetes mellitus without complications: Secondary | ICD-10-CM | POA: Insufficient documentation

## 2015-09-22 DIAGNOSIS — S42441A Displaced fracture (avulsion) of medial epicondyle of right humerus, initial encounter for closed fracture: Secondary | ICD-10-CM | POA: Diagnosis not present

## 2015-09-22 DIAGNOSIS — S62616A Displaced fracture of proximal phalanx of right little finger, initial encounter for closed fracture: Secondary | ICD-10-CM | POA: Diagnosis not present

## 2015-09-22 DIAGNOSIS — S42422A Displaced comminuted supracondylar fracture without intercondylar fracture of left humerus, initial encounter for closed fracture: Secondary | ICD-10-CM | POA: Diagnosis not present

## 2015-09-22 DIAGNOSIS — S59902A Unspecified injury of left elbow, initial encounter: Secondary | ICD-10-CM | POA: Diagnosis present

## 2015-09-22 HISTORY — DX: Type 2 diabetes mellitus without complications: E11.9

## 2015-09-22 HISTORY — DX: Cerebral infarction, unspecified: I63.9

## 2015-09-22 LAB — BASIC METABOLIC PANEL
ANION GAP: 12 (ref 5–15)
BUN: 16 mg/dL (ref 6–20)
CALCIUM: 8.2 mg/dL — AB (ref 8.9–10.3)
CO2: 24 mmol/L (ref 22–32)
Chloride: 102 mmol/L (ref 101–111)
Creatinine, Ser: 1.27 mg/dL — ABNORMAL HIGH (ref 0.44–1.00)
GFR calc Af Amer: 45 mL/min — ABNORMAL LOW (ref >60)
GFR, EST NON AFRICAN AMERICAN: 38 mL/min — AB (ref >60)
GLUCOSE: 224 mg/dL — AB (ref 65–99)
POTASSIUM: 3.6 mmol/L (ref 3.5–5.1)
SODIUM: 138 mmol/L (ref 135–145)

## 2015-09-22 LAB — CBC WITH DIFFERENTIAL/PLATELET
BASOS ABS: 0.1 10*3/uL (ref 0–0.1)
Basophils Relative: 0 %
EOS ABS: 0.1 10*3/uL (ref 0–0.7)
EOS PCT: 1 %
HCT: 30.7 % — ABNORMAL LOW (ref 35.0–47.0)
Hemoglobin: 10.6 g/dL — ABNORMAL LOW (ref 12.0–16.0)
Lymphocytes Relative: 6 %
Lymphs Abs: 0.9 10*3/uL — ABNORMAL LOW (ref 1.0–3.6)
MCH: 29.2 pg (ref 26.0–34.0)
MCHC: 34.6 g/dL (ref 32.0–36.0)
MCV: 84.2 fL (ref 80.0–100.0)
MONO ABS: 0.5 10*3/uL (ref 0.2–0.9)
Monocytes Relative: 4 %
Neutro Abs: 12.3 10*3/uL — ABNORMAL HIGH (ref 1.4–6.5)
Neutrophils Relative %: 89 %
PLATELETS: 450 10*3/uL — AB (ref 150–440)
RBC: 3.65 MIL/uL — AB (ref 3.80–5.20)
RDW: 15.6 % — AB (ref 11.5–14.5)
WBC: 13.9 10*3/uL — AB (ref 3.6–11.0)

## 2015-09-22 MED ORDER — HYDROMORPHONE HCL 1 MG/ML IJ SOLN
0.5000 mg | Freq: Once | INTRAMUSCULAR | Status: AC
  Administered 2015-09-22: 0.5 mg via INTRAVENOUS
  Filled 2015-09-22: qty 1

## 2015-09-22 MED ORDER — OXYCODONE-ACETAMINOPHEN 5-325 MG PO TABS: 1.0000 | ORAL_TABLET | Freq: Four times a day (QID) | ORAL | Status: DC | PRN

## 2015-09-22 MED ORDER — ONDANSETRON HCL 4 MG/2ML IJ SOLN
4.0000 mg | Freq: Once | INTRAMUSCULAR | Status: AC
  Administered 2015-09-22: 4 mg via INTRAVENOUS
  Filled 2015-09-22: qty 2
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
## 2015-09-22 NOTE — Progress Notes (Signed)
Discussed case with Randel Pigg, PA in ER.   Patient sustained fall onto left upper extremity sustaining injuries to the left small finger and elbow.  Mr. Tamala Julian reports the injuries are closed and the patient is neurovascularly intact.  I have reviewed the plain radiographs and the CT scan.  Patient has a comminuted fracture of the distal humerus involving the medial condyle. The elbow is not dislocated.  I instructed Mr. Tamala Julian to place a posterior splint on the left elbow with a sling and splint the small finger which has a displaced proximal phalanx fracture.  Patient should follow up with me in the office within 1 week.
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
## 2015-09-23 DIAGNOSIS — F339 Major depressive disorder, recurrent, unspecified: Secondary | ICD-10-CM | POA: Diagnosis not present

## 2015-09-23 DIAGNOSIS — Z9181 History of falling: Secondary | ICD-10-CM | POA: Diagnosis not present

## 2015-09-23 DIAGNOSIS — M6281 Muscle weakness (generalized): Secondary | ICD-10-CM | POA: Diagnosis not present

## 2015-09-23 DIAGNOSIS — Z7901 Long term (current) use of anticoagulants: Secondary | ICD-10-CM | POA: Diagnosis not present

## 2015-09-23 DIAGNOSIS — I6930 Unspecified sequelae of cerebral infarction: Secondary | ICD-10-CM | POA: Diagnosis not present

## 2015-09-23 DIAGNOSIS — E1165 Type 2 diabetes mellitus with hyperglycemia: Secondary | ICD-10-CM | POA: Diagnosis not present

## 2015-09-23 DIAGNOSIS — S62616A Displaced fracture of proximal phalanx of right little finger, initial encounter for closed fracture: Secondary | ICD-10-CM | POA: Diagnosis not present

## 2015-09-23 DIAGNOSIS — S42402A Unspecified fracture of lower end of left humerus, initial encounter for closed fracture: Secondary | ICD-10-CM | POA: Diagnosis not present

## 2015-09-23 DIAGNOSIS — I10 Essential (primary) hypertension: Secondary | ICD-10-CM | POA: Diagnosis not present

## 2015-09-23 DIAGNOSIS — Z794 Long term (current) use of insulin: Secondary | ICD-10-CM | POA: Diagnosis not present

## 2015-09-23 DIAGNOSIS — R2689 Other abnormalities of gait and mobility: Secondary | ICD-10-CM | POA: Diagnosis not present

## 2015-09-24 DIAGNOSIS — E1165 Type 2 diabetes mellitus with hyperglycemia: Secondary | ICD-10-CM | POA: Diagnosis not present

## 2015-09-24 DIAGNOSIS — R2689 Other abnormalities of gait and mobility: Secondary | ICD-10-CM | POA: Diagnosis not present

## 2015-09-24 DIAGNOSIS — Z9181 History of falling: Secondary | ICD-10-CM | POA: Diagnosis not present

## 2015-09-24 DIAGNOSIS — M6281 Muscle weakness (generalized): Secondary | ICD-10-CM | POA: Diagnosis not present

## 2015-09-24 DIAGNOSIS — I6930 Unspecified sequelae of cerebral infarction: Secondary | ICD-10-CM | POA: Diagnosis not present

## 2015-09-24 DIAGNOSIS — Z7901 Long term (current) use of anticoagulants: Secondary | ICD-10-CM | POA: Diagnosis not present

## 2015-09-24 DIAGNOSIS — Z794 Long term (current) use of insulin: Secondary | ICD-10-CM | POA: Diagnosis not present

## 2015-09-24 DIAGNOSIS — I10 Essential (primary) hypertension: Secondary | ICD-10-CM | POA: Diagnosis not present

## 2015-09-24 DIAGNOSIS — F339 Major depressive disorder, recurrent, unspecified: Secondary | ICD-10-CM | POA: Diagnosis not present

## 2015-09-28 DIAGNOSIS — R2689 Other abnormalities of gait and mobility: Secondary | ICD-10-CM | POA: Diagnosis not present

## 2015-09-28 DIAGNOSIS — E1165 Type 2 diabetes mellitus with hyperglycemia: Secondary | ICD-10-CM | POA: Diagnosis not present

## 2015-09-28 DIAGNOSIS — Z794 Long term (current) use of insulin: Secondary | ICD-10-CM | POA: Diagnosis not present

## 2015-09-28 DIAGNOSIS — Z9181 History of falling: Secondary | ICD-10-CM | POA: Diagnosis not present

## 2015-09-28 DIAGNOSIS — Z7901 Long term (current) use of anticoagulants: Secondary | ICD-10-CM | POA: Diagnosis not present

## 2015-09-28 DIAGNOSIS — I6930 Unspecified sequelae of cerebral infarction: Secondary | ICD-10-CM | POA: Diagnosis not present

## 2015-09-28 DIAGNOSIS — M6281 Muscle weakness (generalized): Secondary | ICD-10-CM | POA: Diagnosis not present

## 2015-09-28 DIAGNOSIS — I10 Essential (primary) hypertension: Secondary | ICD-10-CM | POA: Diagnosis not present

## 2015-09-28 DIAGNOSIS — F339 Major depressive disorder, recurrent, unspecified: Secondary | ICD-10-CM | POA: Diagnosis not present

## 2015-09-29 DIAGNOSIS — E1165 Type 2 diabetes mellitus with hyperglycemia: Secondary | ICD-10-CM | POA: Diagnosis not present

## 2015-09-29 DIAGNOSIS — I6930 Unspecified sequelae of cerebral infarction: Secondary | ICD-10-CM | POA: Diagnosis not present

## 2015-09-29 DIAGNOSIS — Z7901 Long term (current) use of anticoagulants: Secondary | ICD-10-CM | POA: Diagnosis not present

## 2015-09-29 DIAGNOSIS — Z794 Long term (current) use of insulin: Secondary | ICD-10-CM | POA: Diagnosis not present

## 2015-09-29 DIAGNOSIS — I10 Essential (primary) hypertension: Secondary | ICD-10-CM | POA: Diagnosis not present

## 2015-09-29 DIAGNOSIS — Z9181 History of falling: Secondary | ICD-10-CM | POA: Diagnosis not present

## 2015-09-29 DIAGNOSIS — R2689 Other abnormalities of gait and mobility: Secondary | ICD-10-CM | POA: Diagnosis not present

## 2015-09-29 DIAGNOSIS — F339 Major depressive disorder, recurrent, unspecified: Secondary | ICD-10-CM | POA: Diagnosis not present

## 2015-09-29 DIAGNOSIS — M6281 Muscle weakness (generalized): Secondary | ICD-10-CM | POA: Diagnosis not present

## 2015-09-30 DIAGNOSIS — I10 Essential (primary) hypertension: Secondary | ICD-10-CM | POA: Diagnosis not present

## 2015-09-30 DIAGNOSIS — Z794 Long term (current) use of insulin: Secondary | ICD-10-CM | POA: Diagnosis not present

## 2015-09-30 DIAGNOSIS — F339 Major depressive disorder, recurrent, unspecified: Secondary | ICD-10-CM | POA: Diagnosis not present

## 2015-09-30 DIAGNOSIS — Z7901 Long term (current) use of anticoagulants: Secondary | ICD-10-CM | POA: Diagnosis not present

## 2015-09-30 DIAGNOSIS — I6930 Unspecified sequelae of cerebral infarction: Secondary | ICD-10-CM | POA: Diagnosis not present

## 2015-09-30 DIAGNOSIS — R2689 Other abnormalities of gait and mobility: Secondary | ICD-10-CM | POA: Diagnosis not present

## 2015-09-30 DIAGNOSIS — E1165 Type 2 diabetes mellitus with hyperglycemia: Secondary | ICD-10-CM | POA: Diagnosis not present

## 2015-09-30 DIAGNOSIS — Z9181 History of falling: Secondary | ICD-10-CM | POA: Diagnosis not present

## 2015-09-30 DIAGNOSIS — M6281 Muscle weakness (generalized): Secondary | ICD-10-CM | POA: Diagnosis not present

## 2015-10-01 DIAGNOSIS — E1165 Type 2 diabetes mellitus with hyperglycemia: Secondary | ICD-10-CM | POA: Diagnosis not present

## 2015-10-01 DIAGNOSIS — Z794 Long term (current) use of insulin: Secondary | ICD-10-CM | POA: Diagnosis not present

## 2015-10-01 DIAGNOSIS — M6281 Muscle weakness (generalized): Secondary | ICD-10-CM | POA: Diagnosis not present

## 2015-10-01 DIAGNOSIS — R2689 Other abnormalities of gait and mobility: Secondary | ICD-10-CM | POA: Diagnosis not present

## 2015-10-01 DIAGNOSIS — I10 Essential (primary) hypertension: Secondary | ICD-10-CM | POA: Diagnosis not present

## 2015-10-01 DIAGNOSIS — F339 Major depressive disorder, recurrent, unspecified: Secondary | ICD-10-CM | POA: Diagnosis not present

## 2015-10-01 DIAGNOSIS — Z9181 History of falling: Secondary | ICD-10-CM | POA: Diagnosis not present

## 2015-10-01 DIAGNOSIS — I6930 Unspecified sequelae of cerebral infarction: Secondary | ICD-10-CM | POA: Diagnosis not present

## 2015-10-01 DIAGNOSIS — Z7901 Long term (current) use of anticoagulants: Secondary | ICD-10-CM | POA: Diagnosis not present

## 2015-10-04 DIAGNOSIS — Z9181 History of falling: Secondary | ICD-10-CM | POA: Diagnosis not present

## 2015-10-04 DIAGNOSIS — I10 Essential (primary) hypertension: Secondary | ICD-10-CM | POA: Diagnosis not present

## 2015-10-04 DIAGNOSIS — Z7901 Long term (current) use of anticoagulants: Secondary | ICD-10-CM | POA: Diagnosis not present

## 2015-10-04 DIAGNOSIS — Z794 Long term (current) use of insulin: Secondary | ICD-10-CM | POA: Diagnosis not present

## 2015-10-04 DIAGNOSIS — R2689 Other abnormalities of gait and mobility: Secondary | ICD-10-CM | POA: Diagnosis not present

## 2015-10-04 DIAGNOSIS — F339 Major depressive disorder, recurrent, unspecified: Secondary | ICD-10-CM | POA: Diagnosis not present

## 2015-10-04 DIAGNOSIS — I6930 Unspecified sequelae of cerebral infarction: Secondary | ICD-10-CM | POA: Diagnosis not present

## 2015-10-04 DIAGNOSIS — M6281 Muscle weakness (generalized): Secondary | ICD-10-CM | POA: Diagnosis not present

## 2015-10-04 DIAGNOSIS — E1165 Type 2 diabetes mellitus with hyperglycemia: Secondary | ICD-10-CM | POA: Diagnosis not present

## 2015-10-06 DIAGNOSIS — F339 Major depressive disorder, recurrent, unspecified: Secondary | ICD-10-CM | POA: Diagnosis not present

## 2015-10-06 DIAGNOSIS — I6930 Unspecified sequelae of cerebral infarction: Secondary | ICD-10-CM | POA: Diagnosis not present

## 2015-10-06 DIAGNOSIS — E1165 Type 2 diabetes mellitus with hyperglycemia: Secondary | ICD-10-CM | POA: Diagnosis not present

## 2015-10-06 DIAGNOSIS — Z9181 History of falling: Secondary | ICD-10-CM | POA: Diagnosis not present

## 2015-10-06 DIAGNOSIS — Z7901 Long term (current) use of anticoagulants: Secondary | ICD-10-CM | POA: Diagnosis not present

## 2015-10-06 DIAGNOSIS — R2689 Other abnormalities of gait and mobility: Secondary | ICD-10-CM | POA: Diagnosis not present

## 2015-10-06 DIAGNOSIS — Z794 Long term (current) use of insulin: Secondary | ICD-10-CM | POA: Diagnosis not present

## 2015-10-06 DIAGNOSIS — I10 Essential (primary) hypertension: Secondary | ICD-10-CM | POA: Diagnosis not present

## 2015-10-06 DIAGNOSIS — M6281 Muscle weakness (generalized): Secondary | ICD-10-CM | POA: Diagnosis not present

## 2015-10-08 DIAGNOSIS — Z794 Long term (current) use of insulin: Secondary | ICD-10-CM | POA: Diagnosis not present

## 2015-10-08 DIAGNOSIS — M6281 Muscle weakness (generalized): Secondary | ICD-10-CM | POA: Diagnosis not present

## 2015-10-08 DIAGNOSIS — I10 Essential (primary) hypertension: Secondary | ICD-10-CM | POA: Diagnosis not present

## 2015-10-08 DIAGNOSIS — R2689 Other abnormalities of gait and mobility: Secondary | ICD-10-CM | POA: Diagnosis not present

## 2015-10-08 DIAGNOSIS — Z9181 History of falling: Secondary | ICD-10-CM | POA: Diagnosis not present

## 2015-10-08 DIAGNOSIS — E1165 Type 2 diabetes mellitus with hyperglycemia: Secondary | ICD-10-CM | POA: Diagnosis not present

## 2015-10-08 DIAGNOSIS — I6930 Unspecified sequelae of cerebral infarction: Secondary | ICD-10-CM | POA: Diagnosis not present

## 2015-10-08 DIAGNOSIS — Z7901 Long term (current) use of anticoagulants: Secondary | ICD-10-CM | POA: Diagnosis not present

## 2015-10-08 DIAGNOSIS — F339 Major depressive disorder, recurrent, unspecified: Secondary | ICD-10-CM | POA: Diagnosis not present

## 2015-10-11 DIAGNOSIS — Z794 Long term (current) use of insulin: Secondary | ICD-10-CM | POA: Diagnosis not present

## 2015-10-11 DIAGNOSIS — F339 Major depressive disorder, recurrent, unspecified: Secondary | ICD-10-CM | POA: Diagnosis not present

## 2015-10-11 DIAGNOSIS — Z9181 History of falling: Secondary | ICD-10-CM | POA: Diagnosis not present

## 2015-10-11 DIAGNOSIS — M6281 Muscle weakness (generalized): Secondary | ICD-10-CM | POA: Diagnosis not present

## 2015-10-11 DIAGNOSIS — I6930 Unspecified sequelae of cerebral infarction: Secondary | ICD-10-CM | POA: Diagnosis not present

## 2015-10-11 DIAGNOSIS — I10 Essential (primary) hypertension: Secondary | ICD-10-CM | POA: Diagnosis not present

## 2015-10-11 DIAGNOSIS — Z7901 Long term (current) use of anticoagulants: Secondary | ICD-10-CM | POA: Diagnosis not present

## 2015-10-11 DIAGNOSIS — E1165 Type 2 diabetes mellitus with hyperglycemia: Secondary | ICD-10-CM | POA: Diagnosis not present

## 2015-10-11 DIAGNOSIS — R2689 Other abnormalities of gait and mobility: Secondary | ICD-10-CM | POA: Diagnosis not present

## 2015-10-12 DIAGNOSIS — R2689 Other abnormalities of gait and mobility: Secondary | ICD-10-CM | POA: Diagnosis not present

## 2015-10-12 DIAGNOSIS — I6930 Unspecified sequelae of cerebral infarction: Secondary | ICD-10-CM | POA: Diagnosis not present

## 2015-10-12 DIAGNOSIS — Z794 Long term (current) use of insulin: Secondary | ICD-10-CM | POA: Diagnosis not present

## 2015-10-12 DIAGNOSIS — I10 Essential (primary) hypertension: Secondary | ICD-10-CM | POA: Diagnosis not present

## 2015-10-12 DIAGNOSIS — Z9181 History of falling: Secondary | ICD-10-CM | POA: Diagnosis not present

## 2015-10-12 DIAGNOSIS — E1165 Type 2 diabetes mellitus with hyperglycemia: Secondary | ICD-10-CM | POA: Diagnosis not present

## 2015-10-12 DIAGNOSIS — Z7901 Long term (current) use of anticoagulants: Secondary | ICD-10-CM | POA: Diagnosis not present

## 2015-10-12 DIAGNOSIS — M6281 Muscle weakness (generalized): Secondary | ICD-10-CM | POA: Diagnosis not present

## 2015-10-12 DIAGNOSIS — F339 Major depressive disorder, recurrent, unspecified: Secondary | ICD-10-CM | POA: Diagnosis not present

## 2015-10-20 ENCOUNTER — Emergency Department
Admission: EM | Admit: 2015-10-20 | Discharge: 2015-10-20 | Disposition: A | Discharge status: Home / Self Care | Payer: Commercial Managed Care - HMO | Attending: Emergency Medicine | Admitting: Emergency Medicine

## 2015-10-20 ENCOUNTER — Emergency Department: Payer: Commercial Managed Care - HMO

## 2015-10-20 ENCOUNTER — Encounter: Payer: Self-pay | Admitting: Emergency Medicine

## 2015-10-20 DIAGNOSIS — I10 Essential (primary) hypertension: Secondary | ICD-10-CM | POA: Diagnosis not present

## 2015-10-20 DIAGNOSIS — F339 Major depressive disorder, recurrent, unspecified: Secondary | ICD-10-CM | POA: Diagnosis not present

## 2015-10-20 DIAGNOSIS — S0990XA Unspecified injury of head, initial encounter: Secondary | ICD-10-CM | POA: Diagnosis not present

## 2015-10-20 DIAGNOSIS — R2689 Other abnormalities of gait and mobility: Secondary | ICD-10-CM | POA: Diagnosis not present

## 2015-10-20 DIAGNOSIS — S0081XA Abrasion of other part of head, initial encounter: Secondary | ICD-10-CM | POA: Diagnosis not present

## 2015-10-20 DIAGNOSIS — Z7901 Long term (current) use of anticoagulants: Secondary | ICD-10-CM | POA: Diagnosis not present

## 2015-10-20 DIAGNOSIS — Y998 Other external cause status: Secondary | ICD-10-CM | POA: Insufficient documentation

## 2015-10-20 DIAGNOSIS — M6281 Muscle weakness (generalized): Secondary | ICD-10-CM | POA: Diagnosis not present

## 2015-10-20 DIAGNOSIS — Y92481 Parking lot as the place of occurrence of the external cause: Secondary | ICD-10-CM | POA: Insufficient documentation

## 2015-10-20 DIAGNOSIS — W01198A Fall on same level from slipping, tripping and stumbling with subsequent striking against other object, initial encounter: Secondary | ICD-10-CM | POA: Insufficient documentation

## 2015-10-20 DIAGNOSIS — Z794 Long term (current) use of insulin: Secondary | ICD-10-CM | POA: Diagnosis not present

## 2015-10-20 DIAGNOSIS — S0003XA Contusion of scalp, initial encounter: Secondary | ICD-10-CM | POA: Insufficient documentation

## 2015-10-20 DIAGNOSIS — I6930 Unspecified sequelae of cerebral infarction: Secondary | ICD-10-CM | POA: Diagnosis not present

## 2015-10-20 DIAGNOSIS — E119 Type 2 diabetes mellitus without complications: Secondary | ICD-10-CM | POA: Diagnosis not present

## 2015-10-20 DIAGNOSIS — E1165 Type 2 diabetes mellitus with hyperglycemia: Secondary | ICD-10-CM | POA: Diagnosis not present

## 2015-10-20 DIAGNOSIS — W19XXXA Unspecified fall, initial encounter: Secondary | ICD-10-CM | POA: Diagnosis not present

## 2015-10-20 DIAGNOSIS — Z9181 History of falling: Secondary | ICD-10-CM | POA: Diagnosis not present

## 2015-10-20 DIAGNOSIS — Y9389 Activity, other specified: Secondary | ICD-10-CM | POA: Insufficient documentation

## 2015-10-20 MED ORDER — ACETAMINOPHEN 325 MG PO TABS
ORAL_TABLET | ORAL | Status: AC
  Administered 2015-10-20: 650 mg via ORAL
  Filled 2015-10-20: qty 2

## 2015-10-20 MED ORDER — ACETAMINOPHEN 325 MG PO TABS
650.0000 mg | ORAL_TABLET | Freq: Once | ORAL | Status: AC
  Administered 2015-10-20: 650 mg via ORAL
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
## 2015-10-20 NOTE — ED Notes (Signed)
Patient transported to CT 
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
## 2015-10-20 NOTE — Discharge Instructions (Signed)
Head Injury, Adult You have a head injury. Headaches and throwing up (vomiting) are common after a head injury. It should be easy to wake up from sleeping. Sometimes you must stay in the hospital. Most problems happen within the first 24 hours. Side effects may occur up to 7-10 days after the injury.  WHAT ARE THE TYPES OF HEAD INJURIES? Head injuries can be as minor as a bump. Some head injuries can be more severe. More severe head injuries include:  A jarring injury to the brain (concussion).  A bruise of the brain (contusion). This mean there is bleeding in the brain that can cause swelling.  A cracked skull (skull fracture).  Bleeding in the brain that collects, clots, and forms a bump (hematoma). WHEN SHOULD I GET HELP RIGHT AWAY?   You are confused or sleepy.  You cannot be woken up.  You feel sick to your stomach (nauseous) or keep throwing up (vomiting).  Your dizziness or unsteadiness is getting worse.  You have very bad, lasting headaches that are not helped by medicine. Take medicines only as told by your doctor.  You cannot use your arms or legs like normal.  You cannot walk.  You notice changes in the black spots in the center of the colored part of your eye (pupil).  You have clear or bloody fluid coming from your nose or ears.  You have trouble seeing. During the next 24 hours after the injury, you must stay with someone who can watch you. This person should get help right away (call 911 in the [place] if you start to shake and are not able to control it (have seizures), you pass out, or you are unable to wake up. HOW CAN I PREVENT A HEAD INJURY IN THE FUTURE?  Wear seat belts.  Wear a helmet while bike riding and playing sports like football.  Stay away from dangerous activities around the house. WHEN CAN I RETURN TO NORMAL ACTIVITIES AND ATHLETICS? See your doctor before doing these activities. You should not do normal activities or play contact sports until 1  week after the following symptoms have stopped:  Headache that does not go away.  Dizziness.  Poor attention.  Confusion.  Memory problems.  Sickness to your stomach or throwing up.  Tiredness.  Fussiness.  Bothered by bright lights or loud noises.  Anxiousness or depression.  Restless sleep. MAKE SURE YOU:   Understand these instructions.  Will watch your condition.  Will get help right away if you are not doing well or get worse.   This information is not intended to replace advice given to you by your health care provider. Make sure you discuss any questions you have with your health care provider.   Document Released: 10/19/2008 Document Revised: 11/27/2014 Document Reviewed: 07/14/2013 Elsevier Interactive Patient Education 2016 [place].  Facial or Scalp Contusion A facial or scalp contusion is a deep bruise on the face or head. Injuries to the face and head generally cause a lot of swelling, especially around the eyes. Contusions are the result of an injury that caused bleeding under the skin. The contusion may turn blue, purple, or yellow. Minor injuries will give you a painless contusion, but more severe contusions may stay painful and swollen for a few weeks.  CAUSES  A facial or scalp contusion is caused by a blunt injury or trauma to the face or head area.  SIGNS AND SYMPTOMS   Swelling of the injured area.   Discoloration  of the injured area.   Tenderness, soreness, or pain in the injured area.  DIAGNOSIS  The diagnosis can be made by taking a medical history and doing a physical exam. An X-ray exam, CT scan, or MRI may be needed to determine if there are any associated injuries, such as broken bones (fractures). TREATMENT  Often, the best treatment for a facial or scalp contusion is applying cold compresses to the injured area. Over-the-counter medicines may also be recommended for pain control.  HOME CARE INSTRUCTIONS   Only take  over-the-counter or prescription medicines as directed by your health care provider.   Apply ice to the injured area.   Put ice in a plastic bag.   Place a towel between your skin and the bag.   Leave the ice on for 20 minutes, 2-3 times a day.  SEEK MEDICAL CARE IF:  You have bite problems.   You have pain with chewing.   You are concerned about facial defects. SEEK IMMEDIATE MEDICAL CARE IF:  You have severe pain or a headache that is not relieved by medicine.   You have unusual sleepiness, confusion, or personality changes.   You throw up (vomit).   You have a persistent nosebleed.   You have double vision or blurred vision.   You have fluid drainage from your nose or ear.   You have difficulty walking or using your arms or legs.  MAKE SURE YOU:   Understand these instructions.  Will watch your condition.  Will get help right away if you are not doing well or get worse.   This information is not intended to replace advice given to you by your health care provider. Make sure you discuss any questions you have with your health care provider.   Document Released: 12/14/2004 Document Revised: 11/27/2014 Document Reviewed: 06/19/2013 Elsevier Interactive Patient Education [employer].

## 2015-10-21 DIAGNOSIS — I6930 Unspecified sequelae of cerebral infarction: Secondary | ICD-10-CM | POA: Diagnosis not present

## 2015-10-21 DIAGNOSIS — F339 Major depressive disorder, recurrent, unspecified: Secondary | ICD-10-CM | POA: Diagnosis not present

## 2015-10-21 DIAGNOSIS — Z9181 History of falling: Secondary | ICD-10-CM | POA: Diagnosis not present

## 2015-10-21 DIAGNOSIS — I10 Essential (primary) hypertension: Secondary | ICD-10-CM | POA: Diagnosis not present

## 2015-10-21 DIAGNOSIS — Z794 Long term (current) use of insulin: Secondary | ICD-10-CM | POA: Diagnosis not present

## 2015-10-21 DIAGNOSIS — E1165 Type 2 diabetes mellitus with hyperglycemia: Secondary | ICD-10-CM | POA: Diagnosis not present

## 2015-10-21 DIAGNOSIS — M6281 Muscle weakness (generalized): Secondary | ICD-10-CM | POA: Diagnosis not present

## 2015-10-21 DIAGNOSIS — Z7901 Long term (current) use of anticoagulants: Secondary | ICD-10-CM | POA: Diagnosis not present

## 2015-10-21 DIAGNOSIS — R2689 Other abnormalities of gait and mobility: Secondary | ICD-10-CM | POA: Diagnosis not present

## 2015-10-22 DIAGNOSIS — E1165 Type 2 diabetes mellitus with hyperglycemia: Secondary | ICD-10-CM | POA: Diagnosis not present

## 2015-10-22 DIAGNOSIS — I6930 Unspecified sequelae of cerebral infarction: Secondary | ICD-10-CM | POA: Diagnosis not present

## 2015-10-22 DIAGNOSIS — R2689 Other abnormalities of gait and mobility: Secondary | ICD-10-CM | POA: Diagnosis not present

## 2015-10-22 DIAGNOSIS — I10 Essential (primary) hypertension: Secondary | ICD-10-CM | POA: Diagnosis not present

## 2015-10-22 DIAGNOSIS — E119 Type 2 diabetes mellitus without complications: Secondary | ICD-10-CM | POA: Diagnosis not present

## 2015-10-22 DIAGNOSIS — M6281 Muscle weakness (generalized): Secondary | ICD-10-CM | POA: Diagnosis not present

## 2015-10-22 DIAGNOSIS — S42402S Unspecified fracture of lower end of left humerus, sequela: Secondary | ICD-10-CM | POA: Diagnosis not present

## 2015-10-22 DIAGNOSIS — Z9181 History of falling: Secondary | ICD-10-CM | POA: Diagnosis not present

## 2015-10-22 DIAGNOSIS — Z7901 Long term (current) use of anticoagulants: Secondary | ICD-10-CM | POA: Diagnosis not present

## 2015-10-22 DIAGNOSIS — Z794 Long term (current) use of insulin: Secondary | ICD-10-CM | POA: Diagnosis not present

## 2015-10-22 DIAGNOSIS — Z8673 Personal history of transient ischemic attack (TIA), and cerebral infarction without residual deficits: Secondary | ICD-10-CM | POA: Diagnosis not present

## 2015-10-22 DIAGNOSIS — F339 Major depressive disorder, recurrent, unspecified: Secondary | ICD-10-CM | POA: Diagnosis not present

## 2015-10-22 DIAGNOSIS — E7800 Pure hypercholesterolemia, unspecified: Secondary | ICD-10-CM | POA: Diagnosis not present

## 2015-10-25 DIAGNOSIS — I10 Essential (primary) hypertension: Secondary | ICD-10-CM | POA: Diagnosis not present

## 2015-10-25 DIAGNOSIS — Z8673 Personal history of transient ischemic attack (TIA), and cerebral infarction without residual deficits: Secondary | ICD-10-CM | POA: Diagnosis not present

## 2015-10-25 DIAGNOSIS — E119 Type 2 diabetes mellitus without complications: Secondary | ICD-10-CM | POA: Diagnosis not present

## 2015-10-25 DIAGNOSIS — E7800 Pure hypercholesterolemia, unspecified: Secondary | ICD-10-CM | POA: Diagnosis not present

## 2015-10-25 DIAGNOSIS — Z794 Long term (current) use of insulin: Secondary | ICD-10-CM | POA: Diagnosis not present

## 2015-10-26 DIAGNOSIS — E1165 Type 2 diabetes mellitus with hyperglycemia: Secondary | ICD-10-CM | POA: Diagnosis not present

## 2015-10-26 DIAGNOSIS — Z794 Long term (current) use of insulin: Secondary | ICD-10-CM | POA: Diagnosis not present

## 2015-10-26 DIAGNOSIS — M6281 Muscle weakness (generalized): Secondary | ICD-10-CM | POA: Diagnosis not present

## 2015-10-26 DIAGNOSIS — I10 Essential (primary) hypertension: Secondary | ICD-10-CM | POA: Diagnosis not present

## 2015-10-26 DIAGNOSIS — F339 Major depressive disorder, recurrent, unspecified: Secondary | ICD-10-CM | POA: Diagnosis not present

## 2015-10-26 DIAGNOSIS — Z7901 Long term (current) use of anticoagulants: Secondary | ICD-10-CM | POA: Diagnosis not present

## 2015-10-26 DIAGNOSIS — R2689 Other abnormalities of gait and mobility: Secondary | ICD-10-CM | POA: Diagnosis not present

## 2015-10-26 DIAGNOSIS — Z9181 History of falling: Secondary | ICD-10-CM | POA: Diagnosis not present

## 2015-10-26 DIAGNOSIS — I6930 Unspecified sequelae of cerebral infarction: Secondary | ICD-10-CM | POA: Diagnosis not present

## 2015-10-28 DIAGNOSIS — I10 Essential (primary) hypertension: Secondary | ICD-10-CM | POA: Diagnosis not present

## 2015-10-28 DIAGNOSIS — F339 Major depressive disorder, recurrent, unspecified: Secondary | ICD-10-CM | POA: Diagnosis not present

## 2015-10-28 DIAGNOSIS — M6281 Muscle weakness (generalized): Secondary | ICD-10-CM | POA: Diagnosis not present

## 2015-10-28 DIAGNOSIS — Z7901 Long term (current) use of anticoagulants: Secondary | ICD-10-CM | POA: Diagnosis not present

## 2015-10-28 DIAGNOSIS — Z9181 History of falling: Secondary | ICD-10-CM | POA: Diagnosis not present

## 2015-10-28 DIAGNOSIS — E1165 Type 2 diabetes mellitus with hyperglycemia: Secondary | ICD-10-CM | POA: Diagnosis not present

## 2015-10-28 DIAGNOSIS — I6930 Unspecified sequelae of cerebral infarction: Secondary | ICD-10-CM | POA: Diagnosis not present

## 2015-10-28 DIAGNOSIS — Z794 Long term (current) use of insulin: Secondary | ICD-10-CM | POA: Diagnosis not present

## 2015-10-28 DIAGNOSIS — R2689 Other abnormalities of gait and mobility: Secondary | ICD-10-CM | POA: Diagnosis not present

## 2015-11-03 DIAGNOSIS — I6930 Unspecified sequelae of cerebral infarction: Secondary | ICD-10-CM | POA: Diagnosis not present

## 2015-11-03 DIAGNOSIS — R2689 Other abnormalities of gait and mobility: Secondary | ICD-10-CM | POA: Diagnosis not present

## 2015-11-03 DIAGNOSIS — E119 Type 2 diabetes mellitus without complications: Secondary | ICD-10-CM | POA: Diagnosis not present

## 2015-11-03 DIAGNOSIS — S42402S Unspecified fracture of lower end of left humerus, sequela: Secondary | ICD-10-CM | POA: Diagnosis not present

## 2015-11-03 DIAGNOSIS — I10 Essential (primary) hypertension: Secondary | ICD-10-CM | POA: Diagnosis not present

## 2015-11-03 DIAGNOSIS — M6281 Muscle weakness (generalized): Secondary | ICD-10-CM | POA: Diagnosis not present

## 2015-11-03 DIAGNOSIS — Z9181 History of falling: Secondary | ICD-10-CM | POA: Diagnosis not present

## 2015-11-03 DIAGNOSIS — Z7901 Long term (current) use of anticoagulants: Secondary | ICD-10-CM | POA: Diagnosis not present

## 2015-11-03 DIAGNOSIS — Z794 Long term (current) use of insulin: Secondary | ICD-10-CM | POA: Diagnosis not present

## 2015-11-03 DIAGNOSIS — F339 Major depressive disorder, recurrent, unspecified: Secondary | ICD-10-CM | POA: Diagnosis not present

## 2015-11-03 DIAGNOSIS — E1165 Type 2 diabetes mellitus with hyperglycemia: Secondary | ICD-10-CM | POA: Diagnosis not present

## 2015-11-05 DIAGNOSIS — Z794 Long term (current) use of insulin: Secondary | ICD-10-CM | POA: Diagnosis not present

## 2015-11-05 DIAGNOSIS — Z9181 History of falling: Secondary | ICD-10-CM | POA: Diagnosis not present

## 2015-11-05 DIAGNOSIS — R2689 Other abnormalities of gait and mobility: Secondary | ICD-10-CM | POA: Diagnosis not present

## 2015-11-05 DIAGNOSIS — E1165 Type 2 diabetes mellitus with hyperglycemia: Secondary | ICD-10-CM | POA: Diagnosis not present

## 2015-11-05 DIAGNOSIS — M6281 Muscle weakness (generalized): Secondary | ICD-10-CM | POA: Diagnosis not present

## 2015-11-05 DIAGNOSIS — I6930 Unspecified sequelae of cerebral infarction: Secondary | ICD-10-CM | POA: Diagnosis not present

## 2015-11-05 DIAGNOSIS — F339 Major depressive disorder, recurrent, unspecified: Secondary | ICD-10-CM | POA: Diagnosis not present

## 2015-11-05 DIAGNOSIS — I10 Essential (primary) hypertension: Secondary | ICD-10-CM | POA: Diagnosis not present

## 2015-11-05 DIAGNOSIS — Z7901 Long term (current) use of anticoagulants: Secondary | ICD-10-CM | POA: Diagnosis not present

## 2015-11-08 DIAGNOSIS — Z9181 History of falling: Secondary | ICD-10-CM | POA: Diagnosis not present

## 2015-11-08 DIAGNOSIS — I6930 Unspecified sequelae of cerebral infarction: Secondary | ICD-10-CM | POA: Diagnosis not present

## 2015-11-08 DIAGNOSIS — Z794 Long term (current) use of insulin: Secondary | ICD-10-CM | POA: Diagnosis not present

## 2015-11-08 DIAGNOSIS — S62616D Displaced fracture of proximal phalanx of right little finger, subsequent encounter for fracture with routine healing: Secondary | ICD-10-CM | POA: Diagnosis not present

## 2015-11-08 DIAGNOSIS — I10 Essential (primary) hypertension: Secondary | ICD-10-CM | POA: Diagnosis not present

## 2015-11-08 DIAGNOSIS — E1165 Type 2 diabetes mellitus with hyperglycemia: Secondary | ICD-10-CM | POA: Diagnosis not present

## 2015-11-08 DIAGNOSIS — R2689 Other abnormalities of gait and mobility: Secondary | ICD-10-CM | POA: Diagnosis not present

## 2015-11-08 DIAGNOSIS — F339 Major depressive disorder, recurrent, unspecified: Secondary | ICD-10-CM | POA: Diagnosis not present

## 2015-11-08 DIAGNOSIS — Z7901 Long term (current) use of anticoagulants: Secondary | ICD-10-CM | POA: Diagnosis not present

## 2015-11-08 DIAGNOSIS — S42462D Displaced fracture of medial condyle of left humerus, subsequent encounter for fracture with routine healing: Secondary | ICD-10-CM | POA: Diagnosis not present

## 2015-11-08 DIAGNOSIS — M6281 Muscle weakness (generalized): Secondary | ICD-10-CM | POA: Diagnosis not present

## 2015-11-18 DIAGNOSIS — E876 Hypokalemia: Secondary | ICD-10-CM | POA: Diagnosis not present

## 2015-11-18 DIAGNOSIS — Z794 Long term (current) use of insulin: Secondary | ICD-10-CM | POA: Diagnosis not present

## 2015-11-18 DIAGNOSIS — Z8673 Personal history of transient ischemic attack (TIA), and cerebral infarction without residual deficits: Secondary | ICD-10-CM | POA: Diagnosis not present

## 2015-11-18 DIAGNOSIS — E7800 Pure hypercholesterolemia, unspecified: Secondary | ICD-10-CM | POA: Diagnosis not present

## 2015-11-18 DIAGNOSIS — I10 Essential (primary) hypertension: Secondary | ICD-10-CM | POA: Diagnosis not present

## 2015-11-18 DIAGNOSIS — E119 Type 2 diabetes mellitus without complications: Secondary | ICD-10-CM | POA: Diagnosis not present

## 2015-11-23 ENCOUNTER — Emergency Department: Payer: Commercial Managed Care - HMO

## 2015-11-23 ENCOUNTER — Inpatient Hospital Stay
Admission: EM | Admit: 2015-11-23 | Discharge: 2015-11-25 | DRG: 552 | Disposition: A | Discharge status: Skilled Nursing Facility | Payer: Commercial Managed Care - HMO | Attending: Internal Medicine | Admitting: Internal Medicine

## 2015-11-23 DIAGNOSIS — Z7902 Long term (current) use of antithrombotics/antiplatelets: Secondary | ICD-10-CM | POA: Diagnosis not present

## 2015-11-23 DIAGNOSIS — Y92009 Unspecified place in unspecified non-institutional (private) residence as the place of occurrence of the external cause: Secondary | ICD-10-CM | POA: Diagnosis not present

## 2015-11-23 DIAGNOSIS — S42401D Unspecified fracture of lower end of right humerus, subsequent encounter for fracture with routine healing: Secondary | ICD-10-CM

## 2015-11-23 DIAGNOSIS — R0789 Other chest pain: Secondary | ICD-10-CM | POA: Diagnosis not present

## 2015-11-23 DIAGNOSIS — F329 Major depressive disorder, single episode, unspecified: Secondary | ICD-10-CM | POA: Diagnosis present

## 2015-11-23 DIAGNOSIS — R296 Repeated falls: Secondary | ICD-10-CM | POA: Diagnosis present

## 2015-11-23 DIAGNOSIS — S62609D Fracture of unspecified phalanx of unspecified finger, subsequent encounter for fracture with routine healing: Secondary | ICD-10-CM | POA: Diagnosis not present

## 2015-11-23 DIAGNOSIS — I2510 Atherosclerotic heart disease of native coronary artery without angina pectoris: Secondary | ICD-10-CM | POA: Diagnosis present

## 2015-11-23 DIAGNOSIS — E1122 Type 2 diabetes mellitus with diabetic chronic kidney disease: Secondary | ICD-10-CM | POA: Diagnosis not present

## 2015-11-23 DIAGNOSIS — Z794 Long term (current) use of insulin: Secondary | ICD-10-CM

## 2015-11-23 DIAGNOSIS — N3000 Acute cystitis without hematuria: Secondary | ICD-10-CM | POA: Diagnosis present

## 2015-11-23 DIAGNOSIS — N183 Chronic kidney disease, stage 3 (moderate): Secondary | ICD-10-CM | POA: Diagnosis present

## 2015-11-23 DIAGNOSIS — E876 Hypokalemia: Secondary | ICD-10-CM | POA: Diagnosis not present

## 2015-11-23 DIAGNOSIS — S22079A Unspecified fracture of T9-T10 vertebra, initial encounter for closed fracture: Secondary | ICD-10-CM | POA: Diagnosis not present

## 2015-11-23 DIAGNOSIS — Z8673 Personal history of transient ischemic attack (TIA), and cerebral infarction without residual deficits: Secondary | ICD-10-CM

## 2015-11-23 DIAGNOSIS — Z9181 History of falling: Secondary | ICD-10-CM

## 2015-11-23 DIAGNOSIS — W1830XA Fall on same level, unspecified, initial encounter: Secondary | ICD-10-CM | POA: Diagnosis present

## 2015-11-23 DIAGNOSIS — S3991XA Unspecified injury of abdomen, initial encounter: Secondary | ICD-10-CM | POA: Diagnosis not present

## 2015-11-23 DIAGNOSIS — S22059A Unspecified fracture of T5-T6 vertebra, initial encounter for closed fracture: Secondary | ICD-10-CM | POA: Diagnosis not present

## 2015-11-23 DIAGNOSIS — R4182 Altered mental status, unspecified: Secondary | ICD-10-CM | POA: Diagnosis not present

## 2015-11-23 DIAGNOSIS — S299XXA Unspecified injury of thorax, initial encounter: Secondary | ICD-10-CM | POA: Diagnosis not present

## 2015-11-23 DIAGNOSIS — R52 Pain, unspecified: Secondary | ICD-10-CM

## 2015-11-23 DIAGNOSIS — S22000A Wedge compression fracture of unspecified thoracic vertebra, initial encounter for closed fracture: Secondary | ICD-10-CM

## 2015-11-23 DIAGNOSIS — M549 Dorsalgia, unspecified: Secondary | ICD-10-CM | POA: Diagnosis not present

## 2015-11-23 DIAGNOSIS — W19XXXA Unspecified fall, initial encounter: Secondary | ICD-10-CM | POA: Diagnosis not present

## 2015-11-23 DIAGNOSIS — I129 Hypertensive chronic kidney disease with stage 1 through stage 4 chronic kidney disease, or unspecified chronic kidney disease: Secondary | ICD-10-CM | POA: Diagnosis present

## 2015-11-23 DIAGNOSIS — S22000D Wedge compression fracture of unspecified thoracic vertebra, subsequent encounter for fracture with routine healing: Secondary | ICD-10-CM | POA: Diagnosis not present

## 2015-11-23 DIAGNOSIS — E119 Type 2 diabetes mellitus without complications: Secondary | ICD-10-CM | POA: Diagnosis not present

## 2015-11-23 DIAGNOSIS — Z7982 Long term (current) use of aspirin: Secondary | ICD-10-CM | POA: Diagnosis not present

## 2015-11-23 DIAGNOSIS — F0390 Unspecified dementia without behavioral disturbance: Secondary | ICD-10-CM | POA: Diagnosis not present

## 2015-11-23 DIAGNOSIS — S22070A Wedge compression fracture of T9-T10 vertebra, initial encounter for closed fracture: Secondary | ICD-10-CM | POA: Diagnosis not present

## 2015-11-23 DIAGNOSIS — N390 Urinary tract infection, site not specified: Secondary | ICD-10-CM

## 2015-11-23 DIAGNOSIS — S0990XA Unspecified injury of head, initial encounter: Secondary | ICD-10-CM | POA: Diagnosis not present

## 2015-11-23 DIAGNOSIS — S22069D Unspecified fracture of T7-T8 vertebra, subsequent encounter for fracture with routine healing: Secondary | ICD-10-CM

## 2015-11-23 DIAGNOSIS — M4850XA Collapsed vertebra, not elsewhere classified, site unspecified, initial encounter for fracture: Secondary | ICD-10-CM | POA: Diagnosis not present

## 2015-11-23 LAB — CBC WITH DIFFERENTIAL/PLATELET
Basophils Absolute: 0.1 10*3/uL (ref 0–0.1)
Basophils Relative: 1 %
EOS ABS: 0.2 10*3/uL (ref 0–0.7)
Eosinophils Relative: 3 %
HEMATOCRIT: 32.6 % — AB (ref 35.0–47.0)
HEMOGLOBIN: 10.8 g/dL — AB (ref 12.0–16.0)
LYMPHS ABS: 1.2 10*3/uL (ref 1.0–3.6)
LYMPHS PCT: 12 %
MCH: 25.5 pg — AB (ref 26.0–34.0)
MCHC: 33.3 g/dL (ref 32.0–36.0)
MCV: 76.8 fL — AB (ref 80.0–100.0)
MONOS PCT: 5 %
Monocytes Absolute: 0.5 10*3/uL (ref 0.2–0.9)
NEUTROS ABS: 7.7 10*3/uL — AB (ref 1.4–6.5)
NEUTROS PCT: 79 %
Platelets: 617 10*3/uL — ABNORMAL HIGH (ref 150–440)
RBC: 4.24 MIL/uL (ref 3.80–5.20)
RDW: 17 % — ABNORMAL HIGH (ref 11.5–14.5)
WBC: 9.7 10*3/uL (ref 3.6–11.0)

## 2015-11-23 LAB — COMPREHENSIVE METABOLIC PANEL
ALK PHOS: 105 U/L (ref 38–126)
ALT: 12 U/L — ABNORMAL LOW (ref 14–54)
ANION GAP: 13 (ref 5–15)
AST: 16 U/L (ref 15–41)
Albumin: 2.7 g/dL — ABNORMAL LOW (ref 3.5–5.0)
BUN: 16 mg/dL (ref 6–20)
CALCIUM: 8.2 mg/dL — AB (ref 8.9–10.3)
CO2: 22 mmol/L (ref 22–32)
Chloride: 102 mmol/L (ref 101–111)
Creatinine, Ser: 1.22 mg/dL — ABNORMAL HIGH (ref 0.44–1.00)
GFR calc non Af Amer: 40 mL/min — ABNORMAL LOW (ref >60)
GFR, EST AFRICAN AMERICAN: 47 mL/min — AB (ref >60)
Glucose, Bld: 133 mg/dL — ABNORMAL HIGH (ref 65–99)
Potassium: 2.6 mmol/L — CL (ref 3.5–5.1)
Sodium: 137 mmol/L (ref 135–145)
Total Bilirubin: 0.7 mg/dL (ref 0.3–1.2)
Total Protein: 6.7 g/dL (ref 6.5–8.1)

## 2015-11-23 LAB — TROPONIN I

## 2015-11-23 LAB — LACTIC ACID, PLASMA
LACTIC ACID, VENOUS: 0.8 mmol/L (ref 0.5–2.0)
Lactic Acid, Venous: 0.9 mmol/L (ref 0.5–2.0)

## 2015-11-23 LAB — BRAIN NATRIURETIC PEPTIDE: B Natriuretic Peptide: 561 pg/mL — ABNORMAL HIGH (ref 0.0–100.0)

## 2015-11-23 MED ORDER — MORPHINE SULFATE (PF) 4 MG/ML IV SOLN
4.0000 mg | Freq: Once | INTRAVENOUS | Status: AC
  Administered 2015-11-23: 4 mg via INTRAVENOUS
  Filled 2015-11-23: qty 1

## 2015-11-23 MED ORDER — MORPHINE SULFATE (PF) 4 MG/ML IV SOLN
4.0000 mg | Freq: Once | INTRAVENOUS | Status: AC
  Administered 2015-11-24: 4 mg via INTRAVENOUS
  Filled 2015-11-23: qty 1

## 2015-11-23 MED ORDER — ONDANSETRON HCL 4 MG/2ML IJ SOLN
4.0000 mg | Freq: Once | INTRAMUSCULAR | Status: AC
  Administered 2015-11-23: 4 mg via INTRAVENOUS
  Filled 2015-11-23: qty 2

## 2015-11-23 NOTE — ED Notes (Signed)
Patient transported to MRI 

## 2015-11-23 NOTE — ED Provider Notes (Signed)
[facility] Emergency Department Provider Note  ____________________________________________  Time seen: Approximately 4:30 PM  I have reviewed the triage vital signs and the nursing notes.   HISTORY  Chief Complaint Weakness and Back Pain    HPI Carrie Craig is a 82 y.o. female Patient has a history of frequent falls she fell once broke fingers on her left hand and she fell again and broke her right elbow since then she's fallen out of bed 3 times. Complaining of pain in her ribs and back. Most of the back pain is in the low back. It hurts to palpate her low back and also for her to try to sit. Patient reports her legs are too weak to really walk well. Patient denies any incontinence patient also denies any numbness in the perineum.back pain and weakness and been going on for a week. Patient's been in bed for last 2 days.   Past Medical History  Diagnosis Date  . Diabetes mellitus without complication ([place])   . Stroke [facility])     There are no active problems to display for this patient.   No past surgical history on file.  Current Outpatient Rx  Name  Route  Sig  Dispense  Refill  . aspirin EC 81 MG tablet   Oral   Take 81 mg by mouth daily.         Marland Kitchen atorvastatin (LIPITOR) 40 MG tablet   Oral   Take 40 mg by mouth at bedtime.         . citalopram (CELEXA) 20 MG tablet   Oral   Take 20 mg by mouth daily.         . clopidogrel (PLAVIX) 75 MG tablet   Oral   Take 75 mg by mouth daily.         . cyclobenzaprine (FLEXERIL) 5 MG tablet   Oral   Take 5 mg by mouth 3 (three) times daily as needed for muscle spasms.         Marland Kitchen docusate sodium (COLACE) 100 MG capsule   Oral   Take 100 mg by mouth 2 (two) times daily.         . insulin glargine (LANTUS) 100 UNIT/ML injection   Subcutaneous   Inject 10 Units into the skin at bedtime.         . insulin lispro (HUMALOG) 100 UNIT/ML injection   Subcutaneous   Inject 6 Units into the  skin 3 (three) times daily with meals.         . metoprolol tartrate (LOPRESSOR) 25 MG tablet   Oral   Take 25 mg by mouth 2 (two) times daily.         . Omega-3 Fatty Acids (FISH OIL) 1000 MG CAPS   Oral   Take 1,000 mg by mouth 2 (two) times daily.         . pantoprazole (PROTONIX) 40 MG tablet   Oral   Take 40 mg by mouth daily.         . traMADol (ULTRAM) 50 MG tablet   Oral   Take 50 mg by mouth every 6 (six) hours as needed for moderate pain.         Marland Kitchen oxyCODONE-acetaminophen (ROXICET) 5-325 MG tablet   Oral   Take 1 tablet by mouth every 6 (six) hours as needed. Patient not taking: Reported on 11/23/2015   20 tablet   0     Allergies Review of patient's allergies  indicates no known allergies.  No family history on file.  Social History Social History  Substance Use Topics  . Smoking status: Never Smoker   . Smokeless tobacco: Never Used  . Alcohol Use: No    Review of Systems Constitutional: No fever/chills Eyes: No visual changes. ENT: No sore throat. Cardiovascular: Denies chest pain. Respiratory: Denies shortness of breath. Gastrointestinal: No abdominal pain.  No nausea, no vomiting.  No diarrhea.  No constipation. Genitourinary: Negative for dysuria. Musculoskeletal:see history of present illness Skin: Negative for rash. Neurological: Negative for headaches, focal weakness or numbness.  10-point ROS otherwise negative.  ____________________________________________   PHYSICAL EXAM:  VITAL SIGNS: ED Triage Vitals  Enc Vitals Group     BP --      Pulse --      Resp --      Temp --      Temp src --      SpO2 --      Weight --      Height --      Head Cir --      Peak Flow --      Pain Score --      Pain Loc --      Pain Edu? --      Excl. in [place]? --     Constitutional: Alert and oriented. Well appearing and in no acute distress. Eyes: Conjunctivae are normal. PERRL. EOMI. Head: Atraumatic. Nose: No  congestion/rhinnorhea. Mouth/Throat: Mucous membranes are moist.  Oropharynx non-erythematous. Neck: No stridor.  No cervical spine tenderness to palpation. Cardiovascular: Normal rate, regular rhythm. Grossly normal heart sounds.  Good peripheral circulation. Respiratory: Normal respiratory effort.  No retractions. Lungs CTAB. Chest: There is some tenderness in the lower thoracic spine and also tenderness in the ribs bilaterally to palpation. Gastrointestinal: Soft and nontender. No distention. No abdominal bruits. No CVA tenderness. Musculoskeletal: No lower extremity tenderness nor edema.  No joint effusions. Back: Diffusely tender to palpation of the L-spine Neurologic:  Normal speech and language. No gross focal neurologic deficits are appreciated. No gait instability. Skin:  Skin is warm, dry and intact. No rash noted. Psychiatric: Mood and affect are normal. Speech and behavior are normal.  ____________________________________________   LABS (all labs ordered are listed, but only abnormal results are displayed)  Labs Reviewed  COMPREHENSIVE METABOLIC PANEL - Abnormal; Notable for the following:    Potassium 2.6 (*)    Glucose, Bld 133 (*)    Creatinine, Ser 1.22 (*)    Calcium 8.2 (*)    Albumin 2.7 (*)    ALT 12 (*)    GFR calc non Af Amer 40 (*)    GFR calc Af Amer 47 (*)    All other components within normal limits  BRAIN NATRIURETIC PEPTIDE - Abnormal; Notable for the following:    B Natriuretic Peptide 561.0 (*)    All other components within normal limits  CBC WITH DIFFERENTIAL/PLATELET - Abnormal; Notable for the following:    Hemoglobin 10.8 (*)    HCT 32.6 (*)    MCV 76.8 (*)    MCH 25.5 (*)    RDW 17.0 (*)    Platelets 617 (*)    Neutro Abs 7.7 (*)    All other components within normal limits  TROPONIN I  LACTIC ACID, PLASMA  LACTIC ACID, PLASMA  URINALYSIS COMPLETEWITH MICROSCOPIC ([facility] ONLY)   ____________________________________________  EKG  EKG  read and interpreted by me shows normal sinus rhythm at a  rate of 68 normal axis nonspecific ST-T wave changes diffusely computer is reading prolonged QT interval with a QTC of 588 ____________________________________________  RADIOLOGY  CT shows compression fractures multiple. 2 may be acute. Per radiology  MRI shows only 1 acute compression fracture with some nonsignificant retropulsion and edema. Her radiology ____________________________________________   PROCEDURES    ____________________________________________   INITIAL IMPRESSION / ASSESSMENT AND PLAN / ED COURSE  Pertinent labs & imaging results that were available during my care of the patient were reviewed by me and considered in my medical decision making (see chart for details).   ____________________________________________   FINAL CLINICAL IMPRESSION(S) / ED DIAGNOSES  Final diagnoses:  Pain  Thoracic compression fracture, closed, initial encounter [facility])      Nena Polio, MD 11/23/15 2355

## 2015-11-23 NOTE — ED Notes (Signed)
Pt here via EMS from home  Pt verbalizes 8/10 back and left side pain  Left elbow is in splint due to recent fx  Pt's brother states  "Ya'll better keep her until you find something wrong with her."

## 2015-11-24 DIAGNOSIS — Z7982 Long term (current) use of aspirin: Secondary | ICD-10-CM | POA: Diagnosis not present

## 2015-11-24 DIAGNOSIS — E1122 Type 2 diabetes mellitus with diabetic chronic kidney disease: Secondary | ICD-10-CM | POA: Diagnosis present

## 2015-11-24 DIAGNOSIS — N183 Chronic kidney disease, stage 3 (moderate): Secondary | ICD-10-CM | POA: Diagnosis present

## 2015-11-24 DIAGNOSIS — S62609D Fracture of unspecified phalanx of unspecified finger, subsequent encounter for fracture with routine healing: Secondary | ICD-10-CM | POA: Diagnosis not present

## 2015-11-24 DIAGNOSIS — Y92009 Unspecified place in unspecified non-institutional (private) residence as the place of occurrence of the external cause: Secondary | ICD-10-CM | POA: Diagnosis not present

## 2015-11-24 DIAGNOSIS — R52 Pain, unspecified: Secondary | ICD-10-CM | POA: Diagnosis present

## 2015-11-24 DIAGNOSIS — W1830XA Fall on same level, unspecified, initial encounter: Secondary | ICD-10-CM | POA: Diagnosis present

## 2015-11-24 DIAGNOSIS — I129 Hypertensive chronic kidney disease with stage 1 through stage 4 chronic kidney disease, or unspecified chronic kidney disease: Secondary | ICD-10-CM | POA: Diagnosis present

## 2015-11-24 DIAGNOSIS — N3000 Acute cystitis without hematuria: Secondary | ICD-10-CM | POA: Diagnosis present

## 2015-11-24 DIAGNOSIS — I2510 Atherosclerotic heart disease of native coronary artery without angina pectoris: Secondary | ICD-10-CM | POA: Diagnosis present

## 2015-11-24 DIAGNOSIS — S22069D Unspecified fracture of T7-T8 vertebra, subsequent encounter for fracture with routine healing: Secondary | ICD-10-CM | POA: Diagnosis not present

## 2015-11-24 DIAGNOSIS — F329 Major depressive disorder, single episode, unspecified: Secondary | ICD-10-CM | POA: Diagnosis present

## 2015-11-24 DIAGNOSIS — F0390 Unspecified dementia without behavioral disturbance: Secondary | ICD-10-CM | POA: Diagnosis present

## 2015-11-24 DIAGNOSIS — Z794 Long term (current) use of insulin: Secondary | ICD-10-CM | POA: Diagnosis not present

## 2015-11-24 DIAGNOSIS — R296 Repeated falls: Secondary | ICD-10-CM | POA: Diagnosis present

## 2015-11-24 DIAGNOSIS — Z8673 Personal history of transient ischemic attack (TIA), and cerebral infarction without residual deficits: Secondary | ICD-10-CM | POA: Diagnosis not present

## 2015-11-24 DIAGNOSIS — E876 Hypokalemia: Secondary | ICD-10-CM | POA: Diagnosis present

## 2015-11-24 DIAGNOSIS — S22079A Unspecified fracture of T9-T10 vertebra, initial encounter for closed fracture: Secondary | ICD-10-CM | POA: Diagnosis present

## 2015-11-24 DIAGNOSIS — Z9181 History of falling: Secondary | ICD-10-CM | POA: Diagnosis not present

## 2015-11-24 DIAGNOSIS — S42401D Unspecified fracture of lower end of right humerus, subsequent encounter for fracture with routine healing: Secondary | ICD-10-CM | POA: Diagnosis not present

## 2015-11-24 DIAGNOSIS — Z7902 Long term (current) use of antithrombotics/antiplatelets: Secondary | ICD-10-CM | POA: Diagnosis not present

## 2015-11-24 LAB — URINALYSIS COMPLETE WITH MICROSCOPIC (ARMC ONLY)
Bilirubin Urine: NEGATIVE
Glucose, UA: NEGATIVE mg/dL
Hgb urine dipstick: NEGATIVE
Leukocytes, UA: NEGATIVE
Nitrite: NEGATIVE
Protein, ur: >500 mg/dL — AB
Specific Gravity, Urine: 1.02 (ref 1.005–1.030)
pH: 5 (ref 5.0–8.0)

## 2015-11-24 LAB — GLUCOSE, CAPILLARY
Glucose-Capillary: 103 mg/dL — ABNORMAL HIGH (ref 65–99)
Glucose-Capillary: 103 mg/dL — ABNORMAL HIGH (ref 65–99)
Glucose-Capillary: 142 mg/dL — ABNORMAL HIGH (ref 65–99)
Glucose-Capillary: 143 mg/dL — ABNORMAL HIGH (ref 65–99)
Glucose-Capillary: 144 mg/dL — ABNORMAL HIGH (ref 65–99)

## 2015-11-24 LAB — BASIC METABOLIC PANEL
Anion gap: 6 (ref 5–15)
BUN: 19 mg/dL (ref 6–20)
CALCIUM: 8.3 mg/dL — AB (ref 8.9–10.3)
CO2: 25 mmol/L (ref 22–32)
CREATININE: 1.14 mg/dL — AB (ref 0.44–1.00)
Chloride: 108 mmol/L (ref 101–111)
GFR calc non Af Amer: 44 mL/min — ABNORMAL LOW (ref >60)
GFR, EST AFRICAN AMERICAN: 51 mL/min — AB (ref >60)
Glucose, Bld: 157 mg/dL — ABNORMAL HIGH (ref 65–99)
Potassium: 4.3 mmol/L (ref 3.5–5.1)
SODIUM: 139 mmol/L (ref 135–145)

## 2015-11-24 MED ORDER — DEXTROSE 5 % IV SOLN
1.0000 g | INTRAVENOUS | Status: DC
  Administered 2015-11-24 – 2015-11-25 (×2): 1 g via INTRAVENOUS
  Filled 2015-11-24 (×3): qty 10

## 2015-11-24 MED ORDER — ASPIRIN EC 81 MG PO TBEC
81.0000 mg | DELAYED_RELEASE_TABLET | Freq: Every day | ORAL | Status: DC
  Administered 2015-11-24 – 2015-11-25 (×2): 81 mg via ORAL
  Filled 2015-11-24 (×2): qty 1

## 2015-11-24 MED ORDER — POTASSIUM CHLORIDE IN NACL 40-0.9 MEQ/L-% IV SOLN
INTRAVENOUS | Status: DC
  Administered 2015-11-24 (×2): 125 mL/h via INTRAVENOUS
  Filled 2015-11-24 (×3): qty 1000

## 2015-11-24 MED ORDER — ONDANSETRON HCL 4 MG PO TABS: 4.0000 mg | ORAL_TABLET | Freq: Four times a day (QID) | ORAL | Status: DC | PRN

## 2015-11-24 MED ORDER — ENOXAPARIN SODIUM 40 MG/0.4ML ~~LOC~~ SOLN
40.0000 mg | SUBCUTANEOUS | Status: DC
  Administered 2015-11-24: 40 mg via SUBCUTANEOUS
  Filled 2015-11-24: qty 0.4

## 2015-11-24 MED ORDER — ATORVASTATIN CALCIUM 20 MG PO TABS
40.0000 mg | ORAL_TABLET | Freq: Every day | ORAL | Status: DC
  Administered 2015-11-24: 40 mg via ORAL
  Filled 2015-11-24: qty 2

## 2015-11-24 MED ORDER — SODIUM CHLORIDE 0.9 % IV SOLN
INTRAVENOUS | Status: DC
  Administered 2015-11-24 – 2015-11-25 (×2): via INTRAVENOUS

## 2015-11-24 MED ORDER — ACETAMINOPHEN 650 MG RE SUPP: 650.0000 mg | Freq: Four times a day (QID) | RECTAL | Status: DC | PRN

## 2015-11-24 MED ORDER — TRAMADOL HCL 50 MG PO TABS
50.0000 mg | ORAL_TABLET | Freq: Four times a day (QID) | ORAL | Status: DC | PRN
Start: 2015-11-24 — End: 2015-11-25
  Administered 2015-11-24 – 2015-11-25 (×3): 50 mg via ORAL
  Filled 2015-11-24 (×3): qty 1

## 2015-11-24 MED ORDER — CLOPIDOGREL BISULFATE 75 MG PO TABS
75.0000 mg | ORAL_TABLET | Freq: Every day | ORAL | Status: DC
  Administered 2015-11-24 – 2015-11-25 (×2): 75 mg via ORAL
  Filled 2015-11-24 (×2): qty 1

## 2015-11-24 MED ORDER — POTASSIUM CHLORIDE CRYS ER 20 MEQ PO TBCR
40.0000 meq | EXTENDED_RELEASE_TABLET | Freq: Once | ORAL | Status: AC
  Filled 2015-11-24: qty 2

## 2015-11-24 MED ORDER — DOCUSATE SODIUM 100 MG PO CAPS
100.0000 mg | ORAL_CAPSULE | Freq: Two times a day (BID) | ORAL | Status: DC
  Administered 2015-11-24 – 2015-11-25 (×4): 100 mg via ORAL
  Filled 2015-11-24 (×4): qty 1

## 2015-11-24 MED ORDER — MORPHINE SULFATE (PF) 2 MG/ML IV SOLN
2.0000 mg | INTRAVENOUS | Status: DC | PRN
  Administered 2015-11-25: 2 mg via INTRAVENOUS
  Filled 2015-11-24: qty 1

## 2015-11-24 MED ORDER — PANTOPRAZOLE SODIUM 40 MG PO TBEC
40.0000 mg | DELAYED_RELEASE_TABLET | Freq: Every day | ORAL | Status: DC
  Administered 2015-11-24 – 2015-11-25 (×2): 40 mg via ORAL
  Filled 2015-11-24 (×2): qty 1

## 2015-11-24 MED ORDER — ONDANSETRON HCL 4 MG/2ML IJ SOLN: 4.0000 mg | Freq: Four times a day (QID) | INTRAMUSCULAR | Status: DC | PRN

## 2015-11-24 MED ORDER — METOPROLOL TARTRATE 25 MG PO TABS
25.0000 mg | ORAL_TABLET | Freq: Two times a day (BID) | ORAL | Status: DC
  Administered 2015-11-24 – 2015-11-25 (×4): 25 mg via ORAL
  Filled 2015-11-24 (×5): qty 1

## 2015-11-24 MED ORDER — CYCLOBENZAPRINE HCL 10 MG PO TABS
5.0000 mg | ORAL_TABLET | Freq: Three times a day (TID) | ORAL | Status: DC | PRN
  Administered 2015-11-25: 5 mg via ORAL
  Filled 2015-11-24: qty 1

## 2015-11-24 MED ORDER — HEPARIN SODIUM (PORCINE) 5000 UNIT/ML IJ SOLN
5000.0000 [IU] | Freq: Three times a day (TID) | INTRAMUSCULAR | Status: DC
  Administered 2015-11-24 (×2): 5000 [IU] via SUBCUTANEOUS
  Filled 2015-11-24 (×2): qty 1

## 2015-11-24 MED ORDER — OMEGA-3-ACID ETHYL ESTERS 1 G PO CAPS
1.0000 g | ORAL_CAPSULE | Freq: Every day | ORAL | Status: DC
  Administered 2015-11-24 – 2015-11-25 (×2): 1 g via ORAL
  Filled 2015-11-24 (×2): qty 1

## 2015-11-24 MED ORDER — ENSURE ENLIVE PO LIQD
237.0000 mL | Freq: Three times a day (TID) | ORAL | Status: DC
  Administered 2015-11-24 – 2015-11-25 (×3): 237 mL via ORAL

## 2015-11-24 MED ORDER — INSULIN GLARGINE 100 UNIT/ML ~~LOC~~ SOLN
6.0000 [IU] | Freq: Every day | SUBCUTANEOUS | Status: DC
  Administered 2015-11-24: 6 [IU] via SUBCUTANEOUS
  Filled 2015-11-24 (×2): qty 0.06

## 2015-11-24 MED ORDER — MAGNESIUM SULFATE 2 GM/50ML IV SOLN
2.0000 g | Freq: Once | INTRAVENOUS | Status: AC
  Administered 2015-11-24: 2 g via INTRAVENOUS
  Filled 2015-11-24: qty 50

## 2015-11-24 MED ORDER — INFLUENZA VAC SPLIT QUAD 0.5 ML IM SUSY
0.5000 mL | PREFILLED_SYRINGE | INTRAMUSCULAR | Status: DC
  Filled 2015-11-24: qty 0.5

## 2015-11-24 MED ORDER — INSULIN ASPART 100 UNIT/ML ~~LOC~~ SOLN
0.0000 [IU] | Freq: Three times a day (TID) | SUBCUTANEOUS | Status: DC
  Administered 2015-11-24 – 2015-11-25 (×5): 1 [IU] via SUBCUTANEOUS
  Filled 2015-11-24 (×5): qty 1

## 2015-11-24 MED ORDER — ACETAMINOPHEN 325 MG PO TABS
650.0000 mg | ORAL_TABLET | Freq: Four times a day (QID) | ORAL | Status: DC | PRN
  Administered 2015-11-24 (×2): 650 mg via ORAL
  Filled 2015-11-24 (×2): qty 2

## 2015-11-24 MED ORDER — CITALOPRAM HYDROBROMIDE 20 MG PO TABS
20.0000 mg | ORAL_TABLET | Freq: Every day | ORAL | Status: DC
  Administered 2015-11-24 – 2015-11-25 (×2): 20 mg via ORAL
  Filled 2015-11-24 (×2): qty 1

## 2015-11-24 MED ORDER — POTASSIUM CHLORIDE CRYS ER 20 MEQ PO TBCR
40.0000 meq | EXTENDED_RELEASE_TABLET | Freq: Once | ORAL | Status: AC
  Administered 2015-11-24: 40 meq via ORAL
  Filled 2015-11-24: qty 2

## 2015-11-24 NOTE — Progress Notes (Signed)
Prime doc paged about pts. Urine results. Waiting on return call.

## 2015-11-24 NOTE — Progress Notes (Signed)
[place] [facility] [place] NAME: Carrie Craig    MR#:  [id number]  DATE OF BIRTH:  Jan 18, 1932  SUBJECTIVE:  CHIEF COMPLAINT:   Chief Complaint  Patient presents with  . Weakness  . Back Pain   - Continue admitted with falls and also back pain. Pleasantly confused due to her history of dementia. -Complains of generalized weakness  REVIEW OF SYSTEMS:  Review of Systems  Constitutional: Negative for fever and chills.  Respiratory: Negative for cough, shortness of breath and wheezing.   Cardiovascular: Negative for chest pain and palpitations.  Gastrointestinal: Negative for nausea, vomiting, abdominal pain, diarrhea and constipation.  Genitourinary: Negative for dysuria.  Musculoskeletal: Positive for back pain.  Neurological: Positive for weakness. Negative for dizziness, seizures and headaches.    DRUG ALLERGIES:  No Known Allergies  VITALS:  Blood pressure 148/54, pulse 70, temperature 97.9 F (36.6 C), temperature source Oral, resp. rate 18, height 5\' 3"  (1.6 m), weight 64.184 kg (141 lb 8 oz), SpO2 97 %.  PHYSICAL EXAMINATION:  Physical Exam  GENERAL:  82 y.o.-year-old patient lying in the bed with no acute distress.  EYES: Pupils equal, round, reactive to light and accommodation. No scleral icterus. Extraocular muscles intact.  HEENT: Head atraumatic, normocephalic. Oropharynx and nasopharynx clear.  NECK:  Supple, no jugular venous distention. No thyroid enlargement, no tenderness.  LUNGS: Normal breath sounds bilaterally, no wheezing, rales,rhonchi or crepitation. No use of accessory muscles of respiration.  CARDIOVASCULAR: S1, S2 normal. No murmurs, rubs, or gallops.  ABDOMEN: Soft, nontender, nondistended. Bowel sounds present. No organomegaly or mass.  EXTREMITIES: No pedal edema, cyanosis, or clubbing.  Tenderness mid back in the midline- on the spinal region NEUROLOGIC: Cranial nerves II through XII are intact. Muscle  strength 5/5 in all extremities. Sensation intact. Gait not checked.  PSYCHIATRIC: The patient is alert and oriented x 3.  SKIN: No obvious rash, lesion, or ulcer.    LABORATORY PANEL:   CBC  Recent Labs Lab 11/23/15 1617  WBC 9.7  HGB 10.8*  HCT 32.6*  PLT 617*   ------------------------------------------------------------------------------------------------------------------  Chemistries   Recent Labs Lab 11/23/15 1617 11/24/15 1506  NA 137 139  K 2.6* 4.3  CL 102 108  CO2 22 25  GLUCOSE 133* 157*  BUN 16 19  CREATININE 1.22* 1.14*  CALCIUM 8.2* 8.3*  AST 16  --   ALT 12*  --   ALKPHOS 105  --   BILITOT 0.7  --    ------------------------------------------------------------------------------------------------------------------  Cardiac Enzymes  Recent Labs Lab 11/23/15 1617  TROPONINI <0.03   ------------------------------------------------------------------------------------------------------------------  RADIOLOGY:  Ct Abdomen Pelvis Wo Contrast  11/23/2015  CLINICAL DATA:  Trauma. Patient fell straight contour back last week. She hit her head with no loss of consciousness. She is complaining of left chest wall pain. EXAM: CT CHEST, ABDOMEN AND PELVIS WITHOUT CONTRAST TECHNIQUE: Multidetector CT imaging of the chest, abdomen and pelvis was performed following the standard protocol without IV contrast. COMPARISON:  None. FINDINGS: CT CHEST Neck base and axilla: No discrete mass or adenopathy. Enlarged heterogeneous thyroid gland. Mediastinum and hila: Heart borderline enlarged. There is a trace amount of pericardial fluid versus pericardial thickening. Mild coronary artery calcifications. Great vessels normal in caliber. No mediastinal or hilar masses or pathologically enlarged lymph nodes. No mediastinal hematoma. Lungs and pleura: Small, right greater than left, pleural effusions. Mild dependent subsegmental atelectasis. Mild interstitial thickening is noted  most evident upper lobes in the apices.  No evidence of pneumonia. No mass or suspicious nodule. No pneumothorax. CT ABDOMEN AND PELVIS Hepatobiliary: No liver mass or focal lesion. No evidence of a liver contusion or laceration. Gallbladder is moderately distended but otherwise unremarkable. No bile duct dilation. Spleen: No mass.  No evidence of a contusion or laceration. Pancreas: No mass or inflammatory change. No evidence of a laceration. Adrenal glands:  No masses. Kidneys, ureters, bladder: Areas of renal cortical thinning consistent with scarring. No convincing renal mass. No stones. No hydronephrosis. Ureters normal in course and in caliber. Bladder is unremarkable. Uterus and adnexa:  Unremarkable. Lymph nodes:  No adenopathy. Ascites:  None. Gastrointestinal: No evidence of bowel obstruction. No bowel inflammation. No evidence of a bowel wall hematoma or the mesenteric hematoma. Abdominal wall: Mild diffuse nonspecific subcutaneous edema. No focal contusion. MUSCULOSKELETAL There is a severe compression fracture of T8 with a mild to moderate compression fracture of T10 the and a mild compression fracture of T8. A Schmorl's node depresses the upper endplate of L1. No other vertebral fractures. There are old left-sided rib fractures with fractures of the lateral seventh and eighth ribs and of the posterior twelfth rib. There is no convincing acute rib fracture. There are old fractures of the right symphysis pubis and superior pubic ramus. Bones are diffusely demineralized. IMPRESSION: 1. No evidence of acute traumatic injury to the chest, abdomen or pelvis. 2. No evidence of an acute rib fracture. Vertebral fractures are noted which are likely also chronic. Acute fracture of T6 and/or T10 is possible. The severe T8 compression fracture appears chronic. 3. Trace amount pericardial fluid, small pleural effusions, some subcutaneous edema and mild interstitial lung thickening. Findings suggest mild chronic  congestive heart failure. Electronically Signed   By: Lajean Manes M.D.   On: 11/23/2015 17:09   Ct Head Wo Contrast  11/23/2015  CLINICAL DATA:  Head injury EXAM: CT HEAD WITHOUT CONTRAST TECHNIQUE: Contiguous axial images were obtained from the base of the skull through the vertex without intravenous contrast. COMPARISON:  10/20/2015 FINDINGS: Global atrophy appropriate to age. Chronic ischemic changes in the periventricular white matter. No mass effect, midline shift, or acute hemorrhage. Mastoid air cells are clear. Intact cranium. IMPRESSION: No acute intracranial pathology. Electronically Signed   By: Marybelle Killings M.D.   On: 11/23/2015 16:56   Ct Chest Wo Contrast  11/23/2015  CLINICAL DATA:  Trauma. Patient fell straight contour back last week. She hit her head with no loss of consciousness. She is complaining of left chest wall pain. EXAM: CT CHEST, ABDOMEN AND PELVIS WITHOUT CONTRAST TECHNIQUE: Multidetector CT imaging of the chest, abdomen and pelvis was performed following the standard protocol without IV contrast. COMPARISON:  None. FINDINGS: CT CHEST Neck base and axilla: No discrete mass or adenopathy. Enlarged heterogeneous thyroid gland. Mediastinum and hila: Heart borderline enlarged. There is a trace amount of pericardial fluid versus pericardial thickening. Mild coronary artery calcifications. Great vessels normal in caliber. No mediastinal or hilar masses or pathologically enlarged lymph nodes. No mediastinal hematoma. Lungs and pleura: Small, right greater than left, pleural effusions. Mild dependent subsegmental atelectasis. Mild interstitial thickening is noted most evident upper lobes in the apices. No evidence of pneumonia. No mass or suspicious nodule. No pneumothorax. CT ABDOMEN AND PELVIS Hepatobiliary: No liver mass or focal lesion. No evidence of a liver contusion or laceration. Gallbladder is moderately distended but otherwise unremarkable. No bile duct dilation. Spleen: No mass.   No evidence of a contusion or laceration. Pancreas: No mass  or inflammatory change. No evidence of a laceration. Adrenal glands:  No masses. Kidneys, ureters, bladder: Areas of renal cortical thinning consistent with scarring. No convincing renal mass. No stones. No hydronephrosis. Ureters normal in course and in caliber. Bladder is unremarkable. Uterus and adnexa:  Unremarkable. Lymph nodes:  No adenopathy. Ascites:  None. Gastrointestinal: No evidence of bowel obstruction. No bowel inflammation. No evidence of a bowel wall hematoma or the mesenteric hematoma. Abdominal wall: Mild diffuse nonspecific subcutaneous edema. No focal contusion. MUSCULOSKELETAL There is a severe compression fracture of T8 with a mild to moderate compression fracture of T10 the and a mild compression fracture of T8. A Schmorl's node depresses the upper endplate of L1. No other vertebral fractures. There are old left-sided rib fractures with fractures of the lateral seventh and eighth ribs and of the posterior twelfth rib. There is no convincing acute rib fracture. There are old fractures of the right symphysis pubis and superior pubic ramus. Bones are diffusely demineralized. IMPRESSION: 1. No evidence of acute traumatic injury to the chest, abdomen or pelvis. 2. No evidence of an acute rib fracture. Vertebral fractures are noted which are likely also chronic. Acute fracture of T6 and/or T10 is possible. The severe T8 compression fracture appears chronic. 3. Trace amount pericardial fluid, small pleural effusions, some subcutaneous edema and mild interstitial lung thickening. Findings suggest mild chronic congestive heart failure. Electronically Signed   By: Lajean Manes M.D.   On: 11/23/2015 17:09   Mr Thoracic Spine Wo Contrast  11/23/2015  CLINICAL DATA:  Initial evaluation for acute back pain status post recent fall. EXAM: MRI THORACIC SPINE WITHOUT CONTRAST TECHNIQUE: Multiplanar, multisequence MR imaging of the thoracic spine was  performed. No intravenous contrast was administered. COMPARISON:  Prior CT from earlier the same day. FINDINGS: Vertebral bodies are normally aligned with preservation of the normal thoracic kyphosis. No listhesis or malalignment. Severe compression deformity of T8 is chronic in nature without associated marrow edema. Associated 4 mm of bony retropulsion with resultant mild canal stenosis. There is an acute compression fracture through the inferior endplate of QA348G. Mild concavity of the inferior endplate of QA348G with associated 20-30% of central height loss. Trace 2 mm bony retropulsion without significant canal stenosis. No other fracture identified. Mild superior endplate height loss at T6 is also chronic. Vertebral body heights otherwise maintained. Signal intensity within the vertebral body bone marrow is otherwise normal. Signal intensity within the thoracic spinal cord is normal. There is surrounding soft tissue edema within the paraspinous soft tissues adjacent to the T10 compression fracture. No other acute paraspinous soft tissue abnormality. Small moderate layering bilateral pleural effusions. Small central disc protrusion at C7-T1 without stenosis. Tiny disc protrusion at T4-5 without stenosis. Tiny central disc protrusion at T7-8 without stenosis. Bilateral facet arthrosis present at T8-9 with ligamentum flavum thickening, which along with the mildly retropulsed fracture contributes to mild canal stenosis. Small central disc protrusion at T12-L1 without stenosis. IMPRESSION: 1. Acute compression fracture through the inferior endplate of QA348G with associated 20% of height loss and minimal 3 mm bony retropulsion. No stenosis. 2. Chronic compression fracture of T8 with associated 4 mm retropulsion and mild canal stenosis. 3. Small central disc protrusions at C7-T1, T4-5, T7-8, and T12-L1 without stenosis. 4. Small to moderate layering bilateral pleural effusions. Electronically Signed   By: Jeannine Boga M.D.   On: 11/23/2015 21:57    EKG:   Orders placed or performed during the hospital encounter of 11/23/15  .  ED EKG  . ED EKG  . EKG 12-Lead  . EKG 12-Lead  . EKG 12-Lead  . EKG 12-Lead    ASSESSMENT AND PLAN:   82 year old elderly female with past medical history significant for hypertension, coronary artery disease, diabetes mellitus, dementia presents to the hospital secondary to weakness and also multiple falls recently. Patient also has been complaining of significant mid back pain.  #1 T10 compression fracture- secondary to falls. -Orthopedics consult for consideration of kyphoplasty. -MRI thoracic spine also showed some old compression fractures as well. -Physical therapy consult and likely rehabilitation placement  #2 Acute cystitis-follow-up urine cultures. -Continue Rocephin for now  #3 CAD-stable at this time. Continue aspirin, Plavix, statin  #4 HTN-continue metoprolol.  #5 DM-on Lantus and sliding scale insulin  #6 Dementia-pleasantly confused. Seems to be at baseline  #7 Hypokalemia-replaced. Follow up in a.m.  #8 DVT prophylaxis-on Lovenox    All the records are reviewed and case discussed with Care Management/Social Workerr. Management plans discussed with the patient, family and they are in agreement.  CODE STATUS: Full Code  TOTAL TIME TAKING CARE OF THIS PATIENT: 38 minutes.   POSSIBLE D/C IN 2 DAYS, DEPENDING ON CLINICAL CONDITION.   Gladstone Lighter M.D on 11/24/2015 at 4:30 PM  Between 7am to 6pm - Pager - [phone]  After 6pm go to [email or web address] - password [email or web address]  [place] [facility]  Office  [phone]  CC: Primary care physician; Rusty Aus., MD

## 2015-11-24 NOTE — Progress Notes (Signed)
   11/24/15 1112  Clinical Encounter Type  Visited With Patient  Visit Type Initial  Referral From Nurse  Consult/Referral To [place] visited with patient and provided prayer and pastoral care.   Chaplain Shriyans Kuenzi [phone]

## 2015-11-24 NOTE — Progress Notes (Signed)
Initial Nutrition Assessment   INTERVENTION:   Meals and Snacks: Cater to patient preferences Medical Food Supplement Therapy: recommend Ensure Enlive po TID, each supplement provides 350 kcal and 20 grams of protein   NUTRITION DIAGNOSIS:   Inadequate oral intake related to acute illness as evidenced by meal completion < 50%.  GOAL:   Patient will meet greater than or equal to 90% of their needs   MONITOR:    (Energy Intake, Anthropometrics, Electrolyte and Renal Profile, Digestive System)  REASON FOR ASSESSMENT:   Consult Poor PO  ASSESSMENT:   Pt admitted with back pain secondary to compression fracture. Pt also with h/o dementia, confused at times on interview.  Past Medical History  Diagnosis Date  . Diabetes mellitus without complication ([place])   . Stroke [facility])     Diet Order:  Diet heart healthy/carb modified Room service appropriate?: Yes; Fluid consistency:: Thin    Current Nutrition: Pt reports she missed lunch today but ate all of her breakfast. Per Nsg staff pt refused breakfast this am and ate only bites of yogurt for lunch before pushing her tray away. RD asked pt if she would like something to eat, to which pt declined.  Food/Nutrition-Related History: Pt reports she has a good appetite and eats 3 meals per day. Pt reports not trying Ensure before but having family members who were suggesting them to her PTA.   Scheduled Medications:  . aspirin EC  81 mg Oral Daily  . atorvastatin  40 mg Oral QHS  . cefTRIAXone (ROCEPHIN)  IV  1 g Intravenous Q24H  . citalopram  20 mg Oral Daily  . clopidogrel  75 mg Oral Daily  . docusate sodium  100 mg Oral BID  . enoxaparin (LOVENOX) injection  40 mg Subcutaneous Q24H  . feeding supplement (ENSURE ENLIVE)  237 mL Oral TID WC  . [START ON 11/25/2015] Influenza vac split quadrivalent PF  0.5 mL Intramuscular Tomorrow-1000  . insulin aspart  0-9 Units Subcutaneous TID WC  . insulin glargine  6 Units Subcutaneous QHS   . metoprolol tartrate  25 mg Oral BID  . omega-3 acid ethyl esters  1 g Oral Daily  . pantoprazole  40 mg Oral QAC breakfast    Continuous Medications:  . 0.9 % NaCl with KCl 40 mEq / L 125 mL/hr (11/24/15 1351)     Electrolyte/Renal Profile and Glucose Profile:   Recent Labs Lab 11/23/15 1617 11/24/15 1506  NA 137 139  K 2.6* 4.3  CL 102 108  CO2 22 25  BUN 16 19  CREATININE 1.22* 1.14*  CALCIUM 8.2* 8.3*  GLUCOSE 133* 157*   Protein Profile:  Recent Labs Lab 11/23/15 1617  ALBUMIN 2.7*    Gastrointestinal Profile: Last BM:  11/23/2015  Nutrition-Focused Physical Exam Findings: Nutrition-Focused physical exam completed. Findings are no fat depletion, no muscle depletion, and no edema, however RD unable to clarify scapular, lower extremities on visit today.    Weight Change: Pt reports weight of 166lbs in June when her sister passed. Pt reports recent weight of 143lbs (11% weight loss), Per [facility] encounters pt weight 151lbs in November, however 20lbs lower in the beginning of November   Height:   Ht Readings from Last 1 Encounters:  11/23/15 5\' 3"  (1.6 m)    Weight:   Wt Readings from Last 1 Encounters:  11/24/15 141 lb 8 oz (64.184 kg)   Wt Readings from Last 10 Encounters:  11/24/15 141 lb 8 oz (64.184 kg)  10/20/15 151 lb 10.8 oz (68.8 kg)  09/22/15 131 lb (59.421 kg)    BMI:  Body mass index is 25.07 kg/(m^2).  Estimated Nutritional Needs:   Kcal:  BEE: 1074kcals, TEE: (IF 1.1-1.3)(AF 1.2) 1418-1675kcal  Protein:  70-83g protein (1.1-1.3g/kg)  Fluid:  1600-1915mL of fluid (25-80mL/kg)  EDUCATION NEEDS:   Education needs no appropriate at this time  [place], RD, LDN Pager [phone] Weekend/On-Call Pager [phone]

## 2015-11-24 NOTE — NC FL2 (Signed)
[place] LEVEL OF CARE SCREENING TOOL     IDENTIFICATION  Patient Name: Carrie Craig Birthdate: 1932-10-01 Sex: female Admission Date (Current Location): 11/23/2015  [place] and [place] Number:  Engineering geologist and Address:  [facility], [address], [place], [place] [address]      Provider Number: [id number]  Attending Physician Name and Address:  Gladstone Lighter, MD  Relative Name and Phone Number:       Current Level of Care: Hospital Recommended Level of Care: [place] Prior Approval Number:    Date Approved/Denied:   PASRR Number:  ([id number])  Discharge Plan: SNF    Current Diagnoses: Patient Active Problem List   Diagnosis Date Noted  . Intractable pain 11/24/2015    Orientation RESPIRATION BLADDER Height & Weight    Self, Time, Situation, Place  O2 (Nasal Cannula 2 (L/min) ) Continent 5\' 3"  (160 cm) 141 lbs.  BEHAVIORAL SYMPTOMS/MOOD NEUROLOGICAL BOWEL NUTRITION STATUS   (None)  (None) Continent Diet (heart healthy/carb modified )  AMBULATORY STATUS COMMUNICATION OF NEEDS Skin   Extensive Assist Verbally Normal                       Personal Care Assistance Level of Assistance  Bathing, Feeding, Dressing Bathing Assistance: Limited assistance Feeding assistance: Independent Dressing Assistance: Limited assistance     Functional Limitations Info  Sight, Hearing, Speech Sight Info: Adequate Hearing Info: Adequate Speech Info: Adequate    SPECIAL CARE FACTORS FREQUENCY  PT (By licensed PT)     PT Frequency:  (5)              Contractures      Additional Factors Info  Allergies, Code Status, Insulin Sliding Scale Code Status Info:  (Full Code) Allergies Info:  (No Known Allergies )   Insulin Sliding Scale Info:  (insulin aspart (novoLOG) injection 0-9 Units- 3x daily with meals)       Current Medications (11/24/2015):  This is the current hospital active medication  list Current Facility-Administered Medications  Medication Dose Route Frequency Provider Last Rate Last Dose  . 0.9 %  sodium chloride infusion   Intravenous Continuous Gladstone Lighter, MD      . acetaminophen (TYLENOL) tablet 650 mg  650 mg Oral Q6H PRN Harrie Foreman, MD   650 mg at 11/24/15 1248   Or  . acetaminophen (TYLENOL) suppository 650 mg  650 mg Rectal Q6H PRN Harrie Foreman, MD      . aspirin EC tablet 81 mg  81 mg Oral Daily Harrie Foreman, MD   81 mg at 11/24/15 0844  . atorvastatin (LIPITOR) tablet 40 mg  40 mg Oral QHS Harrie Foreman, MD      . cefTRIAXone (ROCEPHIN) 1 g in dextrose 5 % 50 mL IVPB  1 g Intravenous Q24H Harrie Foreman, MD   1 g at 11/24/15 0844  . citalopram (CELEXA) tablet 20 mg  20 mg Oral Daily Harrie Foreman, MD   20 mg at 11/24/15 0844  . clopidogrel (PLAVIX) tablet 75 mg  75 mg Oral Daily Harrie Foreman, MD   75 mg at 11/24/15 0843  . cyclobenzaprine (FLEXERIL) tablet 5 mg  5 mg Oral TID PRN Harrie Foreman, MD      . docusate sodium (COLACE) capsule 100 mg  100 mg Oral BID Harrie Foreman, MD   100 mg at 11/24/15 0844  . enoxaparin (LOVENOX)  injection 40 mg  40 mg Subcutaneous Q24H Gladstone Lighter, MD      . feeding supplement (ENSURE ENLIVE) (ENSURE ENLIVE) liquid 237 mL  237 mL Oral TID WC Gladstone Lighter, MD      . Derrill Memo ON 11/25/2015] Influenza vac split quadrivalent PF (FLUARIX) injection 0.5 mL  0.5 mL Intramuscular Tomorrow-1000 Gladstone Lighter, MD      . insulin aspart (novoLOG) injection 0-9 Units  0-9 Units Subcutaneous TID WC Harrie Foreman, MD   1 Units at 11/24/15 1248  . insulin glargine (LANTUS) injection 6 Units  6 Units Subcutaneous QHS Harrie Foreman, MD      . metoprolol tartrate (LOPRESSOR) tablet 25 mg  25 mg Oral BID Harrie Foreman, MD   25 mg at 11/24/15 0844  . morphine 2 MG/ML injection 2 mg  2 mg Intravenous Q3H PRN Harrie Foreman, MD      . omega-3 acid ethyl esters (LOVAZA) capsule 1 g   1 g Oral Daily Harrie Foreman, MD   1 g at 11/24/15 0844  . ondansetron (ZOFRAN) tablet 4 mg  4 mg Oral Q6H PRN Harrie Foreman, MD       Or  . ondansetron [facility]) injection 4 mg  4 mg Intravenous Q6H PRN Harrie Foreman, MD      . pantoprazole (PROTONIX) EC tablet 40 mg  40 mg Oral QAC breakfast Harrie Foreman, MD   40 mg at 11/24/15 0844  . traMADol (ULTRAM) tablet 50 mg  50 mg Oral Q6H PRN Gladstone Lighter, MD         Discharge Medications: Please see discharge summary for a list of discharge medications.  Relevant Imaging Results:  Relevant Lab Results:   Additional Information  (SSN: [id number])  Lorenso Quarry [place], LCSW

## 2015-11-24 NOTE — Clinical Social Work Placement (Signed)
   CLINICAL SOCIAL WORK PLACEMENT  NOTE  Date:  11/24/2015  Patient Details  Name: Carrie Craig MRN: [id number] Date of Birth: [birth year]  Clinical Social Work is seeking post-discharge placement for this patient at the [place] level of care (*CSW will initial, date and re-position this form in  chart as items are completed):  Yes   Patient/family provided with [place] Work Department's list of facilities offering this level of care within the geographic area requested by the patient (or if unable, by the patient's family).  Yes   Patient/family informed of their freedom to choose among providers that offer the needed level of care, that participate in Medicare, Medicaid or managed care program needed by the patient, have an available bed and are willing to accept the patient.  Yes   Patient/family informed of [facility]'s ownership interest in [facility] and [facility], as well as of the fact that they are under no obligation to receive care at these facilities.  PASRR submitted to EDS on 11/24/15     PASRR number received on 11/24/15     Existing PASRR number confirmed on       FL2 transmitted to all facilities in geographic area requested by pt/family on 11/24/15     FL2 transmitted to all facilities within larger geographic area on       Patient informed that his/her managed care company has contracts with or will negotiate with certain facilities, including the following:            Patient/family informed of bed offers received.  Patient chooses bed at       Physician recommends and patient chooses bed at      Patient to be transferred to   on  .  Patient to be transferred to facility by       Patient family notified on   of transfer.  Name of family member notified:        PHYSICIAN       Additional Comment:    _______________________________________________ Darden Dates, LCSW 11/24/2015, 5:11 PM

## 2015-11-24 NOTE — Care Management Note (Signed)
Case Management Note  Patient Details  Name: Carrie Craig MRN: [id number] Date of Birth: [birth year]  Subjective/Objective:                 Patient presents from home with intractable pain.  Found to have Compression fractures.  Patient lives at home with her brother.  History of dementia, patient is confused at this time.  Contacted brother Mr. Jomarie Longs. Mr. Jomarie Longs states that they live together in his home.Marland Kitchen He is with her 24 hours a day, and provides her transportation.  Her medications are obtained from [facility] in [place].  PCP is Dr Sabra Heck.  Walker and cane in home.  Mr. Jomarie Longs states that the patient has frequent falls at home.  Per Mr. Jomarie Longs patient is open with [facility].  I called and spoke with [facility] she was open with SN, PT, and PT however was discharged on 11/08/15.  PT consult pending.  RNCM following for discharge.    Action/Plan:   Expected Discharge Date:                  Expected Discharge Plan:     In-House Referral:     Discharge planning Services     Post Acute Care Choice:    Choice offered to:     DME Arranged:    DME Agency:     HH Arranged:    [place] Agency:     Status of Service:     Medicare Important Message Given:    Date Medicare IM Given:    Medicare IM give by:    Date Additional Medicare IM Given:    Additional Medicare Important Message give by:     If discussed at [place] of Stay Meetings, dates discussed:    Additional Comments:  Beverly Sessions, RN 11/24/2015, 11:25 AM

## 2015-11-24 NOTE — Evaluation (Signed)
Physical Therapy Evaluation Patient Details Name: Carrie Craig MRN: [id number] DOB: 09/19/1932 Today's Date: 11/24/2015   History of Present Illness  The patient presents emergency department from her home complaining of pain in her back. The patient has a history of frequent falls at home. She has not been able to walk for the last few days due to pain. The patient also states that her lower extremities are weak. Otherwise she denies complaints. Notably, the patient may have some underlying dementia as well. In the emergency department initial physical exam revealed tenderness to palpation along her spine. A CT scan of her back revealed multiple fractures both acute and chronic. T-spine MRI showed acute T10 fracture with chronic T8 fractures. Head CT was negative for acute changes. The patient received pain medicine but remained into much pain to ambulate which prompted the emergency department staff to call for admission. Pt is AOx2 with orientation to person and time but disorientation to place. Reports, "I'm at [place]." Pt is aware that she was brought to the hospital due to a fall. Unclear if history from patient is accurate. Pt reports "about 8" falls in the last 12 months.   Clinical Impression  Pt is weak and painful with all mobility on this date. She requires assist for bed mobility, transfers, and ambulation. Pt unsteady on her feet requiring support to prevent posterior LOB. Pt with considerable increase in pain to 10/10 with all mobility. She is awaiting conversation with family and orthopedics regarding possible kyphoplasty. Pt will need SNF placement at discharge due to weakness and limited mobility. Pt will benefit from skilled PT services to address deficits in strength, balance, and mobility in order to return to full function at home.     Follow Up Recommendations SNF    Equipment Recommendations  None recommended by PT    Recommendations for Other Services       Precautions  / Restrictions Precautions Precautions: Fall Precaution Comments: LUE in posterior splint. Pt reports "I broke that elbow in June." Restrictions Weight Bearing Restrictions: No Other Position/Activity Restrictions: No instructions about LUE WB status. Pt reports she is allowed to use a rolling walker with her LUE      Mobility  Bed Mobility Overal bed mobility: Needs Assistance Bed Mobility: Supine to Sit;Sit to Supine     Supine to sit: Min assist Sit to supine: Mod assist   General bed mobility comments: Pt with significant increase in bed with bed mobility. Cues for rolling onto R side to avoid trunk flexion and then coming from sidelying to sitting. Once upright pt reports 10/10 pain  Transfers Overall transfer level: Needs assistance Equipment used: Rolling walker (2 wheeled) Transfers: Sit to/from Stand Sit to Stand: Min assist         General transfer comment: Pt requires cues for safe hand placment during transfer as well as anterior weight shifting  Ambulation/Gait Ambulation/Gait assistance: Min assist Ambulation Distance (Feet): 3 Feet Assistive device: Rolling walker (2 wheeled) Gait Pattern/deviations: Step-to pattern Gait velocity: Decreased Gait velocity interpretation: Below normal speed for age/gender General Gait Details: Pt takes 3 steps forward/backward. Pt is unsteady on her feet with posterior LOB upon returning to bed and uncontrolled descent onto bed. Increase in pain and unable to ambulate further. Pt very unsteady on her feet. SaO2 remains >90% on room air throughout  Stairs            Wheelchair Mobility    Modified Rankin (Stroke Patients Only)  Balance Overall balance assessment: Needs assistance   Sitting balance-Leahy Scale: Fair       Standing balance-Leahy Scale: Poor Standing balance comment: Pt unable to maintain wide stance static balance without minA+1 support                             Pertinent  Vitals/Pain Pain Assessment: 0-10 Pain Score: 0-No pain (Increased to 10/10 with bed mobilty/transfers) Pain Location: back Pain Descriptors / Indicators: Dull Pain Intervention(s): Monitored during session    Home Living Family/patient expects to be discharged to:: Private residence Living Arrangements: Other relatives (Brother) Available Help at Discharge: Family Type of Home: House Home Access: Level entry     Home Layout: Multi-level (Pt reports she has 3 steps to get up to kitchen) Home Equipment: Gilford Rile - 2 wheels;Cane - quad;Grab bars - tub/shower (no BSC, shower chair, wheelchair)      Prior Function Level of Independence: Independent with assistive device(s)         Comments: Assist with IADLs. Pt reports she uses rolling walker at home     Hand Dominance   Dominant Hand: Right    Extremity/Trunk Assessment   Upper Extremity Assessment: Difficult to assess due to impaired cognition (Pt refuses MMT UE due to back pain)           Lower Extremity Assessment: Generalized weakness         Communication   Communication: No difficulties  Cognition Arousal/Alertness: Awake/alert Behavior During Therapy: WFL for tasks assessed/performed Overall Cognitive Status: No family/caregiver present to determine baseline cognitive functioning (History of dementia. AOx2 at time of evaluation)       Memory: Decreased short-term memory              General Comments      Exercises General Exercises - Lower Extremity Ankle Circles/Pumps: Strengthening;Both;10 reps;Supine Quad Sets: Strengthening;Both;10 reps;Supine Gluteal Sets: Strengthening;Both;10 reps;Supine Hip ABduction/ADduction: Strengthening;Both;10 reps;Supine Straight Leg Raises: Strengthening;Both;10 reps;Supine      Assessment/Plan    PT Assessment Patient needs continued PT services  PT Diagnosis Difficulty walking;Abnormality of gait;Generalized weakness;Acute pain   PT Problem List  Decreased strength;Decreased activity tolerance;Decreased balance;Decreased mobility;Decreased cognition;Decreased knowledge of use of DME;Decreased safety awareness;Decreased knowledge of precautions;Pain  PT Treatment Interventions DME instruction;Gait training;Stair training;Therapeutic activities;Therapeutic exercise;Balance training;Neuromuscular re-education;Cognitive remediation;Patient/family education   PT Goals (Current goals can be found in the Care Plan section) Acute Rehab PT Goals Patient Stated Goal: Return to prior level of function PT Goal Formulation: With patient Time For Goal Achievement: 12/08/15 Potential to Achieve Goals: Good    Frequency 7X/week   Barriers to discharge        Co-evaluation               End of Session Equipment Utilized During Treatment: Gait belt;Oxygen Activity Tolerance: Patient limited by pain Patient left: in bed;with call bell/phone within reach;with bed alarm set;with SCD's reapplied Nurse Communication: Mobility status         Time: [id number] PT Time Calculation (min) (ACUTE ONLY): 30 min   Charges:   PT Evaluation $PT Eval High Complexity: 1 Procedure PT Treatments $Therapeutic Exercise: 8-22 mins   PT G Codes:       Lyndel Safe Huprich PT, DPT   Huprich,Jason 11/24/2015, 3:45 PM

## 2015-11-24 NOTE — Consult Note (Signed)
Symptomatic T 10 compression fracture,   Will need to talk to family about possible kyphoplasty with patient's confusion.

## 2015-11-24 NOTE — Clinical Social Work Note (Signed)
Clinical Social Work Assessment  Patient Details  Name: Carrie Craig MRN: [id number] Date of Birth: September 16, 1932  Date of referral:  11/24/15               Reason for consult:  Facility Placement                Permission sought to share information with:  Family Supports Permission granted to share information::  Yes, Verbal Permission Granted  Name::     Pt's brother, Carrie Craig   Housing/Transportation Living arrangements for the past 2 months:  Single Family Home Source of Information:  Other (Comment Required) (Brother) Patient Interpreter Needed:  None Criminal Activity/Legal Involvement Pertinent to Current Situation/Hospitalization:  No - Comment as needed Significant Relationships:  Other Family Members Lives with:  Relatives Do you feel safe going back to the place where you live?  Yes Need for family participation in patient care:  Yes (Comment)   Care giving concerns:  Pt lives with brother.   Social Worker assessment / plan:  CSW spoke with pt's brother to address consult that pt was from a facility. CSW introduced herself and explained role of social work. Pt's brother confirmed that pt lives with him and not at a facility. CSW explained the process of discharging to a facility. At time of assessment PT consult was pending. PT recommended SNF. CSW initiated SNF search and will follow up with bed offers. Pt's brother is agreeable to what is recommended. CSW will continue to follow.   Employment status:  Retired Nurse, adult PT Recommendations:  [place] / Referral to community resources:  [place]  Patient/Family's Response to care:  Pt's brother was Patent attorney of CSW support.   Patient/Family's Understanding of and Emotional Response to Diagnosis, Current Treatment, and Prognosis:  Pt's brother is agreeable to the recommendations.   Emotional Assessment Appearance:  Other (Comment  Required Attitude/Demeanor/Rapport:  Unable to Assess Affect (typically observed):  Other Orientation:  Fluctuating Orientation (Suspected and/or reported [place]) Alcohol / Substance use:  Never Used Psych involvement (Current and /or in the community):  No (Comment)  Discharge Needs  Concerns to be addressed:  Adjustment to Illness Readmission within the last 30 days:  No Current discharge risk:  None Barriers to Discharge:  Continued Medical Work up   [employer], LCSW 11/24/2015, 5:07 PM

## 2015-11-24 NOTE — Progress Notes (Signed)
Dr. Marcille Blanco returned page and he's ordering pt. Antibiotics.

## 2015-11-24 NOTE — Progress Notes (Signed)
Will need to be off Plavix one week to have kyphoplasty, discussed with patient's family.

## 2015-11-24 NOTE — Progress Notes (Signed)
MD wants telemetry discontinued

## 2015-11-24 NOTE — H&P (Signed)
Carrie Craig is an 83 y.o. female.   Chief Complaint: Fall HPI: The patient presents emergency department from her nursing facility complaining of pain in her back. The patient has a history of falls and recently fell taking her elbow and fingers. She has not been able to walk for the last few days due to pain. The patient also states that her lower extremities are weak. Otherwise she denies complaints. Notably, the patient may have some underlying dementia as well. In the emergency department initial physical exam revealed tenderness to palpation along her spine. A CT scan of her back revealed multiple fractures both acute and chronic. The patient received pain medicine but remained into much pain to ambulate which prompted the emergency department staff to call for admission.  Past Medical History  Diagnosis Date  . Diabetes mellitus without complication (HCC)   . Stroke (HCC)     History reviewed. No pertinent past surgical history. Unknown-- patient cannot recall  History reviewed. No pertinent family history. the patient cannot contribute to this part of her history Social History:  reports that she has never smoked. She has never used smokeless tobacco. She reports that she does not drink alcohol. Her drug history is not on file.  Allergies: No Known Allergies  Medications Prior to Admission  Medication Sig Dispense Refill  . aspirin EC 81 MG tablet Take 81 mg by mouth daily.    . atorvastatin (LIPITOR) 40 MG tablet Take 40 mg by mouth at bedtime.    . citalopram (CELEXA) 20 MG tablet Take 20 mg by mouth daily.    . clopidogrel (PLAVIX) 75 MG tablet Take 75 mg by mouth daily.    . cyclobenzaprine (FLEXERIL) 5 MG tablet Take 5 mg by mouth 3 (three) times daily as needed for muscle spasms.    . docusate sodium (COLACE) 100 MG capsule Take 100 mg by mouth 2 (two) times daily.    . insulin glargine (LANTUS) 100 UNIT/ML injection Inject 10 Units into the skin at bedtime.    . insulin lispro  (HUMALOG) 100 UNIT/ML injection Inject 6 Units into the skin 3 (three) times daily with meals.    . metoprolol tartrate (LOPRESSOR) 25 MG tablet Take 25 mg by mouth 2 (two) times daily.    . Omega-3 Fatty Acids (FISH OIL) 1000 MG CAPS Take 1,000 mg by mouth 2 (two) times daily.    . pantoprazole (PROTONIX) 40 MG tablet Take 40 mg by mouth daily.    . traMADol (ULTRAM) 50 MG tablet Take 50 mg by mouth every 6 (six) hours as needed for moderate pain.    . oxyCODONE-acetaminophen (ROXICET) 5-325 MG tablet Take 1 tablet by mouth every 6 (six) hours as needed. (Patient not taking: Reported on 11/23/2015) 20 tablet 0    Results for orders placed or performed during the hospital encounter of 11/23/15 (from the past 48 hour(s))  Comprehensive metabolic panel     Status: Abnormal   Collection Time: 11/23/15  4:17 PM  Result Value Ref Range   Sodium 137 135 - 145 mmol/L   Potassium 2.6 (LL) 3.5 - 5.1 mmol/L    Comment: CRITICAL RESULT CALLED TO, READ BACK BY AND VERIFIED WITH  AMY TEAGUE AT 1722 11/23/15 SDR    Chloride 102 101 - 111 mmol/L   CO2 22 22 - 32 mmol/L   Glucose, Bld 133 (H) 65 - 99 mg/dL   BUN 16 6 - 20 mg/dL   Creatinine, Ser 1.22 (H) 0.44 -   1.00 mg/dL   Calcium 8.2 (L) 8.9 - 10.3 mg/dL   Total Protein 6.7 6.5 - 8.1 g/dL   Albumin 2.7 (L) 3.5 - 5.0 g/dL   AST 16 15 - 41 U/L   ALT 12 (L) 14 - 54 U/L   Alkaline Phosphatase 105 38 - 126 U/L   Total Bilirubin 0.7 0.3 - 1.2 mg/dL   GFR calc non Af Amer 40 (L) >60 mL/min   GFR calc Af Amer 47 (L) >60 mL/min    Comment: (NOTE) The eGFR has been calculated using the CKD EPI equation. This calculation has not been validated in all clinical situations. eGFR's persistently <60 mL/min signify possible Chronic Kidney Disease.    Anion gap 13 5 - 15  Brain natriuretic peptide     Status: Abnormal   Collection Time: 11/23/15  4:17 PM  Result Value Ref Range   B Natriuretic Peptide 561.0 (H) 0.0 - 100.0 pg/mL  Troponin I     Status: None    Collection Time: 11/23/15  4:17 PM  Result Value Ref Range   Troponin I <0.03 <0.031 ng/mL    Comment:        NO INDICATION OF MYOCARDIAL INJURY.   CBC with Differential     Status: Abnormal   Collection Time: 11/23/15  4:17 PM  Result Value Ref Range   WBC 9.7 3.6 - 11.0 K/uL   RBC 4.24 3.80 - 5.20 MIL/uL   Hemoglobin 10.8 (L) 12.0 - 16.0 g/dL   HCT 32.6 (L) 35.0 - 47.0 %   MCV 76.8 (L) 80.0 - 100.0 fL   MCH 25.5 (L) 26.0 - 34.0 pg   MCHC 33.3 32.0 - 36.0 g/dL   RDW 17.0 (H) 11.5 - 14.5 %   Platelets 617 (H) 150 - 440 K/uL   Neutrophils Relative % 79 %   Neutro Abs 7.7 (H) 1.4 - 6.5 K/uL   Lymphocytes Relative 12 %   Lymphs Abs 1.2 1.0 - 3.6 K/uL   Monocytes Relative 5 %   Monocytes Absolute 0.5 0.2 - 0.9 K/uL   Eosinophils Relative 3 %   Eosinophils Absolute 0.2 0 - 0.7 K/uL   Basophils Relative 1 %   Basophils Absolute 0.1 0 - 0.1 K/uL  Lactic acid, plasma     Status: None   Collection Time: 11/23/15  5:39 PM  Result Value Ref Range   Lactic Acid, Venous 0.8 0.5 - 2.0 mmol/L  Lactic acid, plasma     Status: None   Collection Time: 11/23/15  7:55 PM  Result Value Ref Range   Lactic Acid, Venous 0.9 0.5 - 2.0 mmol/L  Glucose, capillary     Status: Abnormal   Collection Time: 11/24/15  3:00 AM  Result Value Ref Range   Glucose-Capillary 103 (H) 65 - 99 mg/dL   Comment 1 Notify RN    Comment 2 Document in Chart    Comment 3 Call MD NNP PA CNM   Urinalysis complete, with microscopic     Status: Abnormal   Collection Time: 11/24/15  6:00 AM  Result Value Ref Range   Color, Urine YELLOW (A) YELLOW   APPearance CLOUDY (A) CLEAR   Glucose, UA NEGATIVE NEGATIVE mg/dL   Bilirubin Urine NEGATIVE NEGATIVE   Ketones, ur 1+ (A) NEGATIVE mg/dL   Specific Gravity, Urine 1.020 1.005 - 1.030   Hgb urine dipstick NEGATIVE NEGATIVE   pH 5.0 5.0 - 8.0   Protein, ur >500 (A) NEGATIVE mg/dL     Nitrite NEGATIVE NEGATIVE   Leukocytes, UA NEGATIVE NEGATIVE   RBC / HPF 0-5 0 - 5  RBC/hpf   WBC, UA 6-30 0 - 5 WBC/hpf   Bacteria, UA MANY (A) NONE SEEN   Squamous Epithelial / LPF 0-5 (A) NONE SEEN   Mucous PRESENT    Hyaline Casts, UA PRESENT    Amorphous Crystal PRESENT    Ct Abdomen Pelvis Wo Contrast  11/23/2015  CLINICAL DATA:  Trauma. Patient fell straight contour back last week. She hit her head with no loss of consciousness. She is complaining of left chest wall pain. EXAM: CT CHEST, ABDOMEN AND PELVIS WITHOUT CONTRAST TECHNIQUE: Multidetector CT imaging of the chest, abdomen and pelvis was performed following the standard protocol without IV contrast. COMPARISON:  None. FINDINGS: CT CHEST Neck base and axilla: No discrete mass or adenopathy. Enlarged heterogeneous thyroid gland. Mediastinum and hila: Heart borderline enlarged. There is a trace amount of pericardial fluid versus pericardial thickening. Mild coronary artery calcifications. Great vessels normal in caliber. No mediastinal or hilar masses or pathologically enlarged lymph nodes. No mediastinal hematoma. Lungs and pleura: Small, right greater than left, pleural effusions. Mild dependent subsegmental atelectasis. Mild interstitial thickening is noted most evident upper lobes in the apices. No evidence of pneumonia. No mass or suspicious nodule. No pneumothorax. CT ABDOMEN AND PELVIS Hepatobiliary: No liver mass or focal lesion. No evidence of a liver contusion or laceration. Gallbladder is moderately distended but otherwise unremarkable. No bile duct dilation. Spleen: No mass.  No evidence of a contusion or laceration. Pancreas: No mass or inflammatory change. No evidence of a laceration. Adrenal glands:  No masses. Kidneys, ureters, bladder: Areas of renal cortical thinning consistent with scarring. No convincing renal mass. No stones. No hydronephrosis. Ureters normal in course and in caliber. Bladder is unremarkable. Uterus and adnexa:  Unremarkable. Lymph nodes:  No adenopathy. Ascites:  None. Gastrointestinal: No  evidence of bowel obstruction. No bowel inflammation. No evidence of a bowel wall hematoma or the mesenteric hematoma. Abdominal wall: Mild diffuse nonspecific subcutaneous edema. No focal contusion. MUSCULOSKELETAL There is a severe compression fracture of T8 with a mild to moderate compression fracture of T10 the and a mild compression fracture of T8. A Schmorl's node depresses the upper endplate of L1. No other vertebral fractures. There are old left-sided rib fractures with fractures of the lateral seventh and eighth ribs and of the posterior twelfth rib. There is no convincing acute rib fracture. There are old fractures of the right symphysis pubis and superior pubic ramus. Bones are diffusely demineralized. IMPRESSION: 1. No evidence of acute traumatic injury to the chest, abdomen or pelvis. 2. No evidence of an acute rib fracture. Vertebral fractures are noted which are likely also chronic. Acute fracture of T6 and/or T10 is possible. The severe T8 compression fracture appears chronic. 3. Trace amount pericardial fluid, small pleural effusions, some subcutaneous edema and mild interstitial lung thickening. Findings suggest mild chronic congestive heart failure. Electronically Signed   By: Lajean Manes M.D.   On: 11/23/2015 17:09   Ct Head Wo Contrast  11/23/2015  CLINICAL DATA:  Head injury EXAM: CT HEAD WITHOUT CONTRAST TECHNIQUE: Contiguous axial images were obtained from the base of the skull through the vertex without intravenous contrast. COMPARISON:  10/20/2015 FINDINGS: Global atrophy appropriate to age. Chronic ischemic changes in the periventricular white matter. No mass effect, midline shift, or acute hemorrhage. Mastoid air cells are clear. Intact cranium. IMPRESSION: No acute intracranial pathology. Electronically Signed  By: Marybelle Killings M.D.   On: 11/23/2015 16:56   Ct Chest Wo Contrast  11/23/2015  CLINICAL DATA:  Trauma. Patient fell straight contour back last week. She hit her head with  no loss of consciousness. She is complaining of left chest wall pain. EXAM: CT CHEST, ABDOMEN AND PELVIS WITHOUT CONTRAST TECHNIQUE: Multidetector CT imaging of the chest, abdomen and pelvis was performed following the standard protocol without IV contrast. COMPARISON:  None. FINDINGS: CT CHEST Neck base and axilla: No discrete mass or adenopathy. Enlarged heterogeneous thyroid gland. Mediastinum and hila: Heart borderline enlarged. There is a trace amount of pericardial fluid versus pericardial thickening. Mild coronary artery calcifications. Great vessels normal in caliber. No mediastinal or hilar masses or pathologically enlarged lymph nodes. No mediastinal hematoma. Lungs and pleura: Small, right greater than left, pleural effusions. Mild dependent subsegmental atelectasis. Mild interstitial thickening is noted most evident upper lobes in the apices. No evidence of pneumonia. No mass or suspicious nodule. No pneumothorax. CT ABDOMEN AND PELVIS Hepatobiliary: No liver mass or focal lesion. No evidence of a liver contusion or laceration. Gallbladder is moderately distended but otherwise unremarkable. No bile duct dilation. Spleen: No mass.  No evidence of a contusion or laceration. Pancreas: No mass or inflammatory change. No evidence of a laceration. Adrenal glands:  No masses. Kidneys, ureters, bladder: Areas of renal cortical thinning consistent with scarring. No convincing renal mass. No stones. No hydronephrosis. Ureters normal in course and in caliber. Bladder is unremarkable. Uterus and adnexa:  Unremarkable. Lymph nodes:  No adenopathy. Ascites:  None. Gastrointestinal: No evidence of bowel obstruction. No bowel inflammation. No evidence of a bowel wall hematoma or the mesenteric hematoma. Abdominal wall: Mild diffuse nonspecific subcutaneous edema. No focal contusion. MUSCULOSKELETAL There is a severe compression fracture of T8 with a mild to moderate compression fracture of T10 the and a mild compression  fracture of T8. A Schmorl's node depresses the upper endplate of L1. No other vertebral fractures. There are old left-sided rib fractures with fractures of the lateral seventh and eighth ribs and of the posterior twelfth rib. There is no convincing acute rib fracture. There are old fractures of the right symphysis pubis and superior pubic ramus. Bones are diffusely demineralized. IMPRESSION: 1. No evidence of acute traumatic injury to the chest, abdomen or pelvis. 2. No evidence of an acute rib fracture. Vertebral fractures are noted which are likely also chronic. Acute fracture of T6 and/or T10 is possible. The severe T8 compression fracture appears chronic. 3. Trace amount pericardial fluid, small pleural effusions, some subcutaneous edema and mild interstitial lung thickening. Findings suggest mild chronic congestive heart failure. Electronically Signed   By: Lajean Manes M.D.   On: 11/23/2015 17:09   Mr Thoracic Spine Wo Contrast  11/23/2015  CLINICAL DATA:  Initial evaluation for acute back pain status post recent fall. EXAM: MRI THORACIC SPINE WITHOUT CONTRAST TECHNIQUE: Multiplanar, multisequence MR imaging of the thoracic spine was performed. No intravenous contrast was administered. COMPARISON:  Prior CT from earlier the same day. FINDINGS: Vertebral bodies are normally aligned with preservation of the normal thoracic kyphosis. No listhesis or malalignment. Severe compression deformity of T8 is chronic in nature without associated marrow edema. Associated 4 mm of bony retropulsion with resultant mild canal stenosis. There is an acute compression fracture through the inferior endplate of U04. Mild concavity of the inferior endplate of V40 with associated 20-30% of central height loss. Trace 2 mm bony retropulsion without significant canal stenosis. No other  fracture identified. Mild superior endplate height loss at T6 is also chronic. Vertebral body heights otherwise maintained. Signal intensity within  the vertebral body bone marrow is otherwise normal. Signal intensity within the thoracic spinal cord is normal. There is surrounding soft tissue edema within the paraspinous soft tissues adjacent to the T10 compression fracture. No other acute paraspinous soft tissue abnormality. Small moderate layering bilateral pleural effusions. Small central disc protrusion at C7-T1 without stenosis. Tiny disc protrusion at T4-5 without stenosis. Tiny central disc protrusion at T7-8 without stenosis. Bilateral facet arthrosis present at T8-9 with ligamentum flavum thickening, which along with the mildly retropulsed fracture contributes to mild canal stenosis. Small central disc protrusion at T12-L1 without stenosis. IMPRESSION: 1. Acute compression fracture through the inferior endplate of T10 with associated 20% of height loss and minimal 3 mm bony retropulsion. No stenosis. 2. Chronic compression fracture of T8 with associated 4 mm retropulsion and mild canal stenosis. 3. Small central disc protrusions at C7-T1, T4-5, T7-8, and T12-L1 without stenosis. 4. Small to moderate layering bilateral pleural effusions. Electronically Signed   By: Benjamin  McClintock M.D.   On: 11/23/2015 21:57    Review of Systems  Constitutional: Negative for fever and chills.  HENT: Negative for sore throat and tinnitus.   Eyes: Negative for blurred vision and redness.  Respiratory: Negative for cough and shortness of breath.   Cardiovascular: Negative for chest pain, palpitations, orthopnea and PND.  Gastrointestinal: Negative for nausea, vomiting, abdominal pain and diarrhea.  Genitourinary: Negative for dysuria, urgency and frequency.  Musculoskeletal: Negative for myalgias and joint pain.  Skin: Negative for rash.       No lesions  Neurological: Negative for speech change, focal weakness and weakness.  Endo/Heme/Allergies: Does not bruise/bleed easily.       No temperature intolerance  Psychiatric/Behavioral: Negative for  depression and suicidal ideas.    Blood pressure 143/49, pulse 57, temperature 96.3 F (35.7 C), temperature source Axillary, resp. rate 20, height 5' 3" (1.6 m), weight 64.184 kg (141 lb 8 oz), SpO2 99 %. Physical Exam  Nursing note and vitals reviewed. Constitutional: She is oriented to person, place, and time. She appears well-developed and well-nourished. No distress.  HENT:  Head: Normocephalic and atraumatic.  Mouth/Throat: Oropharynx is clear and moist.  Eyes: Conjunctivae and EOM are normal. Pupils are equal, round, and reactive to light. No scleral icterus.  Neck: Normal range of motion. Neck supple. No JVD present. No tracheal deviation present. No thyromegaly present.  Cardiovascular: Normal rate, regular rhythm and normal heart sounds.  Exam reveals no gallop and no friction rub.   No murmur heard. Respiratory: Effort normal and breath sounds normal.  GI: Soft. Bowel sounds are normal. She exhibits no distension. There is no tenderness.  Genitourinary:  Deferred  Musculoskeletal: Normal range of motion. She exhibits no edema.  Lymphadenopathy:    She has no cervical adenopathy.  Neurological: She is alert and oriented to person, place, and time. No cranial nerve deficit. She exhibits normal muscle tone.  Skin: Skin is warm and dry. No rash noted. No erythema.  Psychiatric: She has a normal mood and affect. Her behavior is normal. Judgment and thought content normal.     Assessment/Plan This is an 83-year-old female admitted for intractable pain due to compression fractures of her thoracic vertebrae. 1. Compression fractures: Do not appear pathologic; the patient has multiple mechanical falls that may be related to her dementia. She will require a safety evaluation prior to discharge home.   Check vitamin D level. The patient denies constitutional symptoms. Fractures do not appear to be malignant. 2. Intractable pain: IV narcotics as needed 3. Coronary artery disease: Stable.  Continue aspirin, Plavix and omega-3 fatty acid supplements 4. Hypertension: Continue metoprolol 5. Urinary tract infection: May be contributing to the patient's imbalance. I have given her 1 dose of ceftriaxone. 6. Diabetes mellitus type 2: Continue basal insulin; add sliding scale insulin to the patient's mealtime coverage 7. Depression/dementia: Continue Celexa 8. DVT prophylaxis: Heparin 9. GI prophylaxis: Pantoprazole per home regimen The patient is a full code. Time spent on admission orders and patient care approximately 35 minutes  Harrie Foreman 11/24/2015, 7:25 AM

## 2015-11-25 DIAGNOSIS — I6350 Cerebral infarction due to unspecified occlusion or stenosis of unspecified cerebral artery: Secondary | ICD-10-CM | POA: Diagnosis not present

## 2015-11-25 DIAGNOSIS — I10 Essential (primary) hypertension: Secondary | ICD-10-CM | POA: Diagnosis not present

## 2015-11-25 DIAGNOSIS — I2510 Atherosclerotic heart disease of native coronary artery without angina pectoris: Secondary | ICD-10-CM | POA: Diagnosis not present

## 2015-11-25 DIAGNOSIS — N390 Urinary tract infection, site not specified: Secondary | ICD-10-CM | POA: Diagnosis not present

## 2015-11-25 DIAGNOSIS — E784 Other hyperlipidemia: Secondary | ICD-10-CM | POA: Diagnosis not present

## 2015-11-25 DIAGNOSIS — N3000 Acute cystitis without hematuria: Secondary | ICD-10-CM | POA: Diagnosis not present

## 2015-11-25 DIAGNOSIS — E119 Type 2 diabetes mellitus without complications: Secondary | ICD-10-CM | POA: Diagnosis not present

## 2015-11-25 DIAGNOSIS — S5290XA Unspecified fracture of unspecified forearm, initial encounter for closed fracture: Secondary | ICD-10-CM | POA: Diagnosis not present

## 2015-11-25 DIAGNOSIS — S42402A Unspecified fracture of lower end of left humerus, initial encounter for closed fracture: Secondary | ICD-10-CM | POA: Diagnosis not present

## 2015-11-25 DIAGNOSIS — N189 Chronic kidney disease, unspecified: Secondary | ICD-10-CM | POA: Diagnosis not present

## 2015-11-25 DIAGNOSIS — F0390 Unspecified dementia without behavioral disturbance: Secondary | ICD-10-CM | POA: Diagnosis not present

## 2015-11-25 DIAGNOSIS — R4182 Altered mental status, unspecified: Secondary | ICD-10-CM | POA: Diagnosis not present

## 2015-11-25 DIAGNOSIS — E876 Hypokalemia: Secondary | ICD-10-CM | POA: Diagnosis not present

## 2015-11-25 DIAGNOSIS — S22000D Wedge compression fracture of unspecified thoracic vertebra, subsequent encounter for fracture with routine healing: Secondary | ICD-10-CM | POA: Diagnosis not present

## 2015-11-25 DIAGNOSIS — S22070D Wedge compression fracture of T9-T10 vertebra, subsequent encounter for fracture with routine healing: Secondary | ICD-10-CM | POA: Diagnosis not present

## 2015-11-25 DIAGNOSIS — S22000A Wedge compression fracture of unspecified thoracic vertebra, initial encounter for closed fracture: Secondary | ICD-10-CM

## 2015-11-25 DIAGNOSIS — Z9181 History of falling: Secondary | ICD-10-CM | POA: Diagnosis not present

## 2015-11-25 DIAGNOSIS — S22070A Wedge compression fracture of T9-T10 vertebra, initial encounter for closed fracture: Secondary | ICD-10-CM | POA: Diagnosis not present

## 2015-11-25 DIAGNOSIS — M4850XA Collapsed vertebra, not elsewhere classified, site unspecified, initial encounter for fracture: Secondary | ICD-10-CM | POA: Diagnosis not present

## 2015-11-25 LAB — BASIC METABOLIC PANEL
Anion gap: 10 (ref 5–15)
BUN: 16 mg/dL (ref 6–20)
CHLORIDE: 107 mmol/L (ref 101–111)
CO2: 22 mmol/L (ref 22–32)
CREATININE: 1.1 mg/dL — AB (ref 0.44–1.00)
Calcium: 8.5 mg/dL — ABNORMAL LOW (ref 8.9–10.3)
GFR, EST AFRICAN AMERICAN: 53 mL/min — AB (ref >60)
GFR, EST NON AFRICAN AMERICAN: 46 mL/min — AB (ref >60)
Glucose, Bld: 131 mg/dL — ABNORMAL HIGH (ref 65–99)
Potassium: 3.8 mmol/L (ref 3.5–5.1)
SODIUM: 139 mmol/L (ref 135–145)

## 2015-11-25 LAB — GLUCOSE, CAPILLARY
Glucose-Capillary: 129 mg/dL — ABNORMAL HIGH (ref 65–99)
Glucose-Capillary: 149 mg/dL — ABNORMAL HIGH (ref 65–99)
Glucose-Capillary: 154 mg/dL — ABNORMAL HIGH (ref 65–99)

## 2015-11-25 LAB — URINE CULTURE

## 2015-11-25 LAB — CALCITRIOL (1,25 DI-OH VIT D): Vit D, 1,25-Dihydroxy: 22.2 pg/mL (ref 19.9–79.3)

## 2015-11-25 MED ORDER — INSULIN GLARGINE 100 UNIT/ML ~~LOC~~ SOLN: 6.0000 [IU] | Freq: Every day | SUBCUTANEOUS | Status: DC

## 2015-11-25 MED ORDER — INSULIN ASPART 100 UNIT/ML ~~LOC~~ SOLN: 0.0000 [IU] | Freq: Three times a day (TID) | SUBCUTANEOUS | Status: DC

## 2015-11-25 MED ORDER — CEPHALEXIN 500 MG PO CAPS: 500.0000 mg | ORAL_CAPSULE | Freq: Three times a day (TID) | ORAL | Status: AC

## 2015-11-25 MED ORDER — METOPROLOL TARTRATE 25 MG PO TABS
25.0000 mg | ORAL_TABLET | Freq: Once | ORAL | Status: AC
  Administered 2015-11-25: 25 mg via ORAL

## 2015-11-25 NOTE — Progress Notes (Signed)
Patient due to void. Patient has not voided since pervious shift I/O. Patient states that she has to void but no relief. Bladder scanner shows 325 ml.  Prime Doc paged. Dr. Estanislado Pandy placing new orders giving

## 2015-11-25 NOTE — Discharge Summary (Addendum)
[facility] [place] NAME: Carrie Craig    MR#:  [id number]  DATE OF BIRTH:  [birth year]  DATE OF ADMISSION:  11/23/2015 ADMITTING PHYSICIAN: Harrie Foreman, MD  DATE OF DISCHARGE: 11/25/2015  PRIMARY CARE PHYSICIAN: Rusty Aus., MD    ADMISSION DIAGNOSIS:  Pain [R52] Thoracic compression fracture, closed, initial encounter ([place]) [S22.000A]  DISCHARGE DIAGNOSIS:  Active Problems:   UTI (urinary tract infection)   Thoracic compression fracture ([place])   SECONDARY DIAGNOSIS:   Past Medical History  Diagnosis Date  . Diabetes mellitus without complication ([place])   . Stroke [facility])     HOSPITAL COURSE:   82 year old elderly female with past medical history significant for hypertension, coronary artery disease, diabetes mellitus, dementia presents to the hospital secondary to weakness and also multiple falls recently. Patient also has been complaining of significant mid back pain.  #1 T10 compression fracture- secondary to falls: MRI of the thoracic spine also shows old compression fractures. She has been seen by orthopedics, Dr. Rudene Christians, who would consider her for kyphoplasty. She will need to be off of Plavix for 1 week prior to this procedure. This has been discontinued at the time of discharge but should be resumed after kyphoplasty. She has been seen by physical therapy and she will need skilled rehabilitation placement. She is discharged today to SNF.  #2 Acute cystitis- white blood cells 6-30 in admission UA. Urine culture is of mixed bacterial types. She was treated with Rocephin during the hospitalization. She will be discharged on Keflex.  #3 CAD- no chest pain during the admission. She continues on aspirin, beta blocker and statin. Plavix will be held until after kyphoplasty.   #4 HTN- blood pressure fairly well controlled. Continue metoprolol.  #5 DM- blood sugars have been low. I have decreased her  standing dose of Lantus to 6 units daily. We have not needed to give her mealtime doses of NovoLog. She should be continued on sliding scale insulin.   #6 Dementia-pleasantly confused. Seems to be at baseline  #7 Hypokalemia-replaced.   #8 chronic kidney disease stage III: Stable with creatinine of 1.1  #9 urinary retention: Patient has had's urinary retention possibly due to pain medication. Output should be monitored closely with bladder scan at least once a day.  DISCHARGE CONDITIONS:   Stable  CONSULTS OBTAINED:     Orthopedics Dr. Rudene Christians  DRUG ALLERGIES:  No Known Allergies  DISCHARGE MEDICATIONS:   Discharge Medication List as of 11/25/2015  6:07 PM    START taking these medications   Details  cephALEXin (KEFLEX) 500 MG capsule Take 1 capsule (500 mg total) by mouth 3 (three) times daily., Starting 11/25/2015, Until Tue 11/30/15, Print    insulin aspart (NOVOLOG) 100 UNIT/ML injection Inject 0-9 Units into the skin 3 (three) times daily with meals., Starting 11/25/2015, Until Discontinued, Normal      CONTINUE these medications which have CHANGED   Details  insulin glargine (LANTUS) 100 UNIT/ML injection Inject 0.06 mLs (6 Units total) into the skin at bedtime., Starting 11/25/2015, Until Discontinued, Normal      CONTINUE these medications which have NOT CHANGED   Details  aspirin EC 81 MG tablet Take 81 mg by mouth daily., Until Discontinued, Historical Med    atorvastatin (LIPITOR) 40 MG tablet Take 40 mg by mouth at bedtime., Until Discontinued, Historical Med    citalopram (CELEXA) 20 MG tablet Take 20 mg by mouth daily., Until Discontinued,  Historical Med    cyclobenzaprine (FLEXERIL) 5 MG tablet Take 5 mg by mouth 3 (three) times daily as needed for muscle spasms., Until Discontinued, Historical Med    docusate sodium (COLACE) 100 MG capsule Take 100 mg by mouth 2 (two) times daily., Until Discontinued, Historical Med    metoprolol tartrate (LOPRESSOR) 25 MG tablet  Take 25 mg by mouth 2 (two) times daily., Until Discontinued, Historical Med    Omega-3 Fatty Acids (FISH OIL) 1000 MG CAPS Take 1,000 mg by mouth 2 (two) times daily., Until Discontinued, Historical Med    pantoprazole (PROTONIX) 40 MG tablet Take 40 mg by mouth daily., Until Discontinued, Historical Med    traMADol (ULTRAM) 50 MG tablet Take 50 mg by mouth every 6 (six) hours as needed for moderate pain., Until Discontinued, Historical Med      STOP taking these medications     clopidogrel (PLAVIX) 75 MG tablet      insulin lispro (HUMALOG) 100 UNIT/ML injection      oxyCODONE-acetaminophen (ROXICET) 5-325 MG tablet          DISCHARGE INSTRUCTIONS:    DIET:  Cardiac diet, Diabetic diet and Low fat, Low cholesterol diet  DISCHARGE CONDITION:  Fair  ACTIVITY:  Activity as tolerated-per physical therapy recommendations  OXYGEN:  Home Oxygen: No.   Oxygen Delivery: room air  DISCHARGE LOCATION:  nursing home   If you experience worsening of your admission symptoms, develop shortness of breath, life threatening emergency, suicidal or homicidal thoughts you must seek medical attention immediately by calling 911 or calling your MD immediately  if symptoms less severe.  You Must read complete instructions/literature along with all the possible adverse reactions/side effects for all the Medicines you take and that have been prescribed to you. Take any new Medicines after you have completely understood and accpet all the possible adverse reactions/side effects.   Please note  You were cared for by a hospitalist during your hospital stay. If you have any questions about your discharge medications or the care you received while you were in the hospital after you are discharged, you can call the unit and asked to speak with the hospitalist on call if the hospitalist that took care of you is not available. Once you are discharged, your primary care physician will handle any further  medical issues. Please note that NO REFILLS for any discharge medications will be authorized once you are discharged, as it is imperative that you return to your primary care physician (or establish a relationship with a primary care physician if you do not have one) for your aftercare needs so that they can reassess your need for medications and monitor your lab values.    Today   CHIEF COMPLAINT:   Chief Complaint  Patient presents with  . Weakness  . Back Pain    HISTORY OF PRESENT ILLNESS:   The patient presents emergency department from her nursing facility complaining of pain in her back. The patient has a history of falls and recently fell taking her elbow and fingers. She has not been able to walk for the last few days due to pain. The patient also states that her lower extremities are weak. Otherwise she denies complaints. Notably, the patient may have some underlying dementia as well. In the emergency department initial physical exam revealed tenderness to palpation along her spine. A CT scan of her back revealed multiple fractures both acute and chronic. The patient received pain medicine but remained into much  pain to ambulate which prompted the emergency department staff to call for admission.  VITAL SIGNS:  Blood pressure 177/68, pulse 72, temperature 98.6 F (37 C), temperature source Oral, resp. rate 18, height 5\' 3"  (1.6 m), weight 67.042 kg (147 lb 12.8 oz), SpO2 100 %.  I/O:   No intake or output data in the 24 hours ending 11/26/15 1530  PHYSICAL EXAMINATION:  GENERAL:  82 y.o.-year-old patient lying in the bed with no acute distress.  LUNGS: Normal breath sounds bilaterally, no wheezing, rales,rhonchi or crepitation. No use of accessory muscles of respiration.  CARDIOVASCULAR: S1, S2 normal. No murmurs, rubs, or gallops.  ABDOMEN: Soft, non-tender, non-distended. Bowel sounds present. No organomegaly or mass.  EXTREMITIES: No pedal edema, cyanosis, or clubbing.   NEUROLOGIC: Cranial nerves II through XII are grossly intact. Moves all 4 extremities PSYCHIATRIC: The patient is sleepy but arousable to voice. She is conversant. She does not retain information. She is oriented to person only SKIN: No obvious rash, lesion, or ulcer.   DATA REVIEW:   CBC  Recent Labs Lab 11/23/15 1617  WBC 9.7  HGB 10.8*  HCT 32.6*  PLT 617*    Chemistries   Recent Labs Lab 11/23/15 1617  11/25/15 0503  NA 137  < > 139  K 2.6*  < > 3.8  CL 102  < > 107  CO2 22  < > 22  GLUCOSE 133*  < > 131*  BUN 16  < > 16  CREATININE 1.22*  < > 1.10*  CALCIUM 8.2*  < > 8.5*  AST 16  --   --   ALT 12*  --   --   ALKPHOS 105  --   --   BILITOT 0.7  --   --   < > = values in this interval not displayed.  Cardiac Enzymes  Recent Labs Lab 11/23/15 1617  TROPONINI <0.03    Microbiology Results  Results for orders placed or performed during the hospital encounter of 11/23/15  Urine culture     Status: None   Collection Time: 11/24/15  6:00 AM  Result Value Ref Range Status   Specimen Description URINE, RANDOM  Final   Special Requests NONE  Final   Culture MULTIPLE SPECIES PRESENT, SUGGEST RECOLLECTION  Final   Report Status 11/25/2015 FINAL  Final    RADIOLOGY:  No results found.  EKG:   Orders placed or performed during the hospital encounter of 11/23/15  . ED EKG  . ED EKG  . EKG 12-Lead  . EKG 12-Lead  . EKG 12-Lead  . EKG 12-Lead      Management plans discussed with the patient, family and they are in agreement.  CODE STATUS:     Code Status Orders        Start     Ordered   11/24/15 0334  Full code   Continuous     11/24/15 0333      TOTAL TIME TAKING CARE OF THIS PATIENT: 35 minutes.  Greater than 50% of time spent in care coordination and counseling.  Myrtis Ser M.D on 11/26/2015 at 3:30 PM  Between 7am to 6pm - Pager - [phone]  After 6pm go to [email or web address] - password [email or web address] [facility]  [facility]   Office  [phone]  CC: Primary care physician; Rusty Aus., MD

## 2015-11-25 NOTE — Clinical Social Work Note (Signed)
Pt is ready for discharge today. CSW provided bed offers. Pt's brother chose [facility]. CSW confirmed bed with admissions coordinator. [employer] [facility]aufmann has been obtained. Facility has received discharge information and is ready to admit pt. Pt and brothers are agreeable to discharge plan. RN to call report and EMS provide transportation. CSW is singing off as no further needs identified.   Carrie Craig, MSW, LCSW Clinical Social Worker [phone]

## 2015-11-25 NOTE — Progress Notes (Signed)
Called report to [facility] spoke to Yaakov Guthrie, Therapist, sports. Called for non emergent transport.

## 2015-11-25 NOTE — Clinical Social Work Placement (Signed)
   CLINICAL SOCIAL WORK PLACEMENT  NOTE  Date:  11/25/2015  Patient Details  Name: Carrie Craig MRN: [id number] Date of Birth: 06-Nov-1932  Clinical Social Work is seeking post-discharge placement for this patient at the [place] level of care (*CSW will initial, date and re-position this form in  chart as items are completed):  Yes   Patient/family provided with [place] Work Department's list of facilities offering this level of care within the geographic area requested by the patient (or if unable, by the patient's family).  Yes   Patient/family informed of their freedom to choose among providers that offer the needed level of care, that participate in Medicare, Medicaid or managed care program needed by the patient, have an available bed and are willing to accept the patient.  Yes   Patient/family informed of [facility]'s ownership interest in [facility] and [facility], as well as of the fact that they are under no obligation to receive care at these facilities.  PASRR submitted to EDS on 11/24/15     PASRR number received on 11/24/15     Existing PASRR number confirmed on       FL2 transmitted to all facilities in geographic area requested by pt/family on 11/24/15     FL2 transmitted to all facilities within larger geographic area on       Patient informed that his/her managed care company has contracts with or will negotiate with certain facilities, including the following:        Yes   Patient/family informed of bed offers received.  Patient chooses bed at [facility]     Physician recommends and patient chooses bed at  [facility])    Patient to be transferred to [facility] on 11/25/15.  Patient to be transferred to facility by [facility]     Patient family notified on 11/25/15 of transfer.  Name of family member notified:  Eliseo Squires and Barnabas Lister, brothers     PHYSICIAN       Additional Comment:     _______________________________________________ Darden Dates, LCSW 11/25/2015, 3:50 PM

## 2015-11-25 NOTE — Progress Notes (Signed)
Per MD approved to proceed with discharge.  Patient blood pressure was elevated and was in pain and pain medication and blood pressure medication aided in lowering blood pressure.

## 2015-11-25 NOTE — Care Management (Signed)
Patient to discharge to SNF.  CSW facilitating discharge.

## 2015-11-30 DIAGNOSIS — E784 Other hyperlipidemia: Secondary | ICD-10-CM | POA: Diagnosis not present

## 2015-11-30 DIAGNOSIS — I6350 Cerebral infarction due to unspecified occlusion or stenosis of unspecified cerebral artery: Secondary | ICD-10-CM | POA: Diagnosis not present

## 2015-11-30 DIAGNOSIS — I2510 Atherosclerotic heart disease of native coronary artery without angina pectoris: Secondary | ICD-10-CM | POA: Diagnosis not present

## 2015-11-30 DIAGNOSIS — I10 Essential (primary) hypertension: Secondary | ICD-10-CM | POA: Diagnosis not present

## 2015-11-30 DIAGNOSIS — E119 Type 2 diabetes mellitus without complications: Secondary | ICD-10-CM | POA: Diagnosis not present

## 2015-11-30 DIAGNOSIS — Z9181 History of falling: Secondary | ICD-10-CM | POA: Diagnosis not present

## 2015-11-30 DIAGNOSIS — N390 Urinary tract infection, site not specified: Secondary | ICD-10-CM | POA: Diagnosis not present

## 2015-11-30 DIAGNOSIS — S5290XA Unspecified fracture of unspecified forearm, initial encounter for closed fracture: Secondary | ICD-10-CM | POA: Diagnosis not present

## 2015-12-07 DIAGNOSIS — S22070D Wedge compression fracture of T9-T10 vertebra, subsequent encounter for fracture with routine healing: Secondary | ICD-10-CM | POA: Diagnosis not present

## 2015-12-07 DIAGNOSIS — I10 Essential (primary) hypertension: Secondary | ICD-10-CM | POA: Diagnosis not present

## 2015-12-07 DIAGNOSIS — N390 Urinary tract infection, site not specified: Secondary | ICD-10-CM | POA: Diagnosis not present

## 2015-12-07 DIAGNOSIS — F0390 Unspecified dementia without behavioral disturbance: Secondary | ICD-10-CM | POA: Diagnosis not present

## 2015-12-07 DIAGNOSIS — E119 Type 2 diabetes mellitus without complications: Secondary | ICD-10-CM | POA: Diagnosis not present

## 2015-12-07 DIAGNOSIS — N189 Chronic kidney disease, unspecified: Secondary | ICD-10-CM | POA: Diagnosis not present

## 2015-12-07 DIAGNOSIS — I2510 Atherosclerotic heart disease of native coronary artery without angina pectoris: Secondary | ICD-10-CM | POA: Diagnosis not present

## 2015-12-17 DIAGNOSIS — S42402A Unspecified fracture of lower end of left humerus, initial encounter for closed fracture: Secondary | ICD-10-CM | POA: Diagnosis not present

## 2015-12-17 DIAGNOSIS — S22070A Wedge compression fracture of T9-T10 vertebra, initial encounter for closed fracture: Secondary | ICD-10-CM | POA: Diagnosis not present

## 2015-12-19 DIAGNOSIS — I10 Essential (primary) hypertension: Secondary | ICD-10-CM | POA: Diagnosis not present

## 2015-12-19 DIAGNOSIS — E119 Type 2 diabetes mellitus without complications: Secondary | ICD-10-CM | POA: Diagnosis not present

## 2015-12-19 DIAGNOSIS — I2510 Atherosclerotic heart disease of native coronary artery without angina pectoris: Secondary | ICD-10-CM | POA: Diagnosis not present

## 2015-12-19 DIAGNOSIS — E784 Other hyperlipidemia: Secondary | ICD-10-CM | POA: Diagnosis not present

## 2015-12-19 DIAGNOSIS — S5290XA Unspecified fracture of unspecified forearm, initial encounter for closed fracture: Secondary | ICD-10-CM | POA: Diagnosis not present

## 2015-12-19 DIAGNOSIS — I6350 Cerebral infarction due to unspecified occlusion or stenosis of unspecified cerebral artery: Secondary | ICD-10-CM | POA: Diagnosis not present

## 2015-12-19 DIAGNOSIS — S22070D Wedge compression fracture of T9-T10 vertebra, subsequent encounter for fracture with routine healing: Secondary | ICD-10-CM | POA: Diagnosis not present

## 2015-12-19 DIAGNOSIS — N390 Urinary tract infection, site not specified: Secondary | ICD-10-CM | POA: Diagnosis not present

## 2015-12-30 DIAGNOSIS — I2510 Atherosclerotic heart disease of native coronary artery without angina pectoris: Secondary | ICD-10-CM | POA: Diagnosis not present

## 2015-12-30 DIAGNOSIS — N390 Urinary tract infection, site not specified: Secondary | ICD-10-CM | POA: Diagnosis not present

## 2015-12-30 DIAGNOSIS — E119 Type 2 diabetes mellitus without complications: Secondary | ICD-10-CM | POA: Diagnosis not present

## 2015-12-30 DIAGNOSIS — S22070D Wedge compression fracture of T9-T10 vertebra, subsequent encounter for fracture with routine healing: Secondary | ICD-10-CM | POA: Diagnosis not present

## 2015-12-30 DIAGNOSIS — E784 Other hyperlipidemia: Secondary | ICD-10-CM | POA: Diagnosis not present

## 2015-12-30 DIAGNOSIS — I10 Essential (primary) hypertension: Secondary | ICD-10-CM | POA: Diagnosis not present

## 2016-01-07 DIAGNOSIS — I129 Hypertensive chronic kidney disease with stage 1 through stage 4 chronic kidney disease, or unspecified chronic kidney disease: Secondary | ICD-10-CM | POA: Diagnosis not present

## 2016-01-07 DIAGNOSIS — S22070D Wedge compression fracture of T9-T10 vertebra, subsequent encounter for fracture with routine healing: Secondary | ICD-10-CM | POA: Diagnosis not present

## 2016-01-07 DIAGNOSIS — S22060D Wedge compression fracture of T7-T8 vertebra, subsequent encounter for fracture with routine healing: Secondary | ICD-10-CM | POA: Diagnosis not present

## 2016-01-07 DIAGNOSIS — E1122 Type 2 diabetes mellitus with diabetic chronic kidney disease: Secondary | ICD-10-CM | POA: Diagnosis not present

## 2016-01-07 DIAGNOSIS — R296 Repeated falls: Secondary | ICD-10-CM | POA: Diagnosis not present

## 2016-01-07 DIAGNOSIS — I2510 Atherosclerotic heart disease of native coronary artery without angina pectoris: Secondary | ICD-10-CM | POA: Diagnosis not present

## 2016-01-07 DIAGNOSIS — N183 Chronic kidney disease, stage 3 (moderate): Secondary | ICD-10-CM | POA: Diagnosis not present

## 2016-01-07 DIAGNOSIS — F0390 Unspecified dementia without behavioral disturbance: Secondary | ICD-10-CM | POA: Diagnosis not present

## 2016-01-07 DIAGNOSIS — S42402D Unspecified fracture of lower end of left humerus, subsequent encounter for fracture with routine healing: Secondary | ICD-10-CM | POA: Diagnosis not present

## 2016-01-09 DIAGNOSIS — E1122 Type 2 diabetes mellitus with diabetic chronic kidney disease: Secondary | ICD-10-CM | POA: Diagnosis not present

## 2016-01-09 DIAGNOSIS — I129 Hypertensive chronic kidney disease with stage 1 through stage 4 chronic kidney disease, or unspecified chronic kidney disease: Secondary | ICD-10-CM | POA: Diagnosis not present

## 2016-01-09 DIAGNOSIS — S22070D Wedge compression fracture of T9-T10 vertebra, subsequent encounter for fracture with routine healing: Secondary | ICD-10-CM | POA: Diagnosis not present

## 2016-01-09 DIAGNOSIS — S42402D Unspecified fracture of lower end of left humerus, subsequent encounter for fracture with routine healing: Secondary | ICD-10-CM | POA: Diagnosis not present

## 2016-01-09 DIAGNOSIS — S22060D Wedge compression fracture of T7-T8 vertebra, subsequent encounter for fracture with routine healing: Secondary | ICD-10-CM | POA: Diagnosis not present

## 2016-01-09 DIAGNOSIS — R296 Repeated falls: Secondary | ICD-10-CM | POA: Diagnosis not present

## 2016-01-09 DIAGNOSIS — I2510 Atherosclerotic heart disease of native coronary artery without angina pectoris: Secondary | ICD-10-CM | POA: Diagnosis not present

## 2016-01-09 DIAGNOSIS — F0390 Unspecified dementia without behavioral disturbance: Secondary | ICD-10-CM | POA: Diagnosis not present

## 2016-01-09 DIAGNOSIS — N183 Chronic kidney disease, stage 3 (moderate): Secondary | ICD-10-CM | POA: Diagnosis not present

## 2016-01-10 DIAGNOSIS — I129 Hypertensive chronic kidney disease with stage 1 through stage 4 chronic kidney disease, or unspecified chronic kidney disease: Secondary | ICD-10-CM | POA: Diagnosis not present

## 2016-01-10 DIAGNOSIS — N183 Chronic kidney disease, stage 3 (moderate): Secondary | ICD-10-CM | POA: Diagnosis not present

## 2016-01-10 DIAGNOSIS — S22070D Wedge compression fracture of T9-T10 vertebra, subsequent encounter for fracture with routine healing: Secondary | ICD-10-CM | POA: Diagnosis not present

## 2016-01-10 DIAGNOSIS — I2510 Atherosclerotic heart disease of native coronary artery without angina pectoris: Secondary | ICD-10-CM | POA: Diagnosis not present

## 2016-01-10 DIAGNOSIS — S42402D Unspecified fracture of lower end of left humerus, subsequent encounter for fracture with routine healing: Secondary | ICD-10-CM | POA: Diagnosis not present

## 2016-01-10 DIAGNOSIS — F0390 Unspecified dementia without behavioral disturbance: Secondary | ICD-10-CM | POA: Diagnosis not present

## 2016-01-10 DIAGNOSIS — S22060D Wedge compression fracture of T7-T8 vertebra, subsequent encounter for fracture with routine healing: Secondary | ICD-10-CM | POA: Diagnosis not present

## 2016-01-10 DIAGNOSIS — E1122 Type 2 diabetes mellitus with diabetic chronic kidney disease: Secondary | ICD-10-CM | POA: Diagnosis not present

## 2016-01-10 DIAGNOSIS — R296 Repeated falls: Secondary | ICD-10-CM | POA: Diagnosis not present

## 2016-01-12 DIAGNOSIS — Z794 Long term (current) use of insulin: Secondary | ICD-10-CM | POA: Diagnosis not present

## 2016-01-12 DIAGNOSIS — R110 Nausea: Secondary | ICD-10-CM | POA: Diagnosis not present

## 2016-01-12 DIAGNOSIS — Z8781 Personal history of (healed) traumatic fracture: Secondary | ICD-10-CM | POA: Diagnosis not present

## 2016-01-12 DIAGNOSIS — E119 Type 2 diabetes mellitus without complications: Secondary | ICD-10-CM | POA: Diagnosis not present

## 2016-01-13 DIAGNOSIS — E1122 Type 2 diabetes mellitus with diabetic chronic kidney disease: Secondary | ICD-10-CM | POA: Diagnosis not present

## 2016-01-13 DIAGNOSIS — F0390 Unspecified dementia without behavioral disturbance: Secondary | ICD-10-CM | POA: Diagnosis not present

## 2016-01-13 DIAGNOSIS — N183 Chronic kidney disease, stage 3 (moderate): Secondary | ICD-10-CM | POA: Diagnosis not present

## 2016-01-13 DIAGNOSIS — S22060D Wedge compression fracture of T7-T8 vertebra, subsequent encounter for fracture with routine healing: Secondary | ICD-10-CM | POA: Diagnosis not present

## 2016-01-13 DIAGNOSIS — S22070D Wedge compression fracture of T9-T10 vertebra, subsequent encounter for fracture with routine healing: Secondary | ICD-10-CM | POA: Diagnosis not present

## 2016-01-13 DIAGNOSIS — I129 Hypertensive chronic kidney disease with stage 1 through stage 4 chronic kidney disease, or unspecified chronic kidney disease: Secondary | ICD-10-CM | POA: Diagnosis not present

## 2016-01-13 DIAGNOSIS — R296 Repeated falls: Secondary | ICD-10-CM | POA: Diagnosis not present

## 2016-01-13 DIAGNOSIS — I2510 Atherosclerotic heart disease of native coronary artery without angina pectoris: Secondary | ICD-10-CM | POA: Diagnosis not present

## 2016-01-13 DIAGNOSIS — S42402D Unspecified fracture of lower end of left humerus, subsequent encounter for fracture with routine healing: Secondary | ICD-10-CM | POA: Diagnosis not present

## 2016-01-20 DIAGNOSIS — R296 Repeated falls: Secondary | ICD-10-CM | POA: Diagnosis not present

## 2016-01-20 DIAGNOSIS — F0390 Unspecified dementia without behavioral disturbance: Secondary | ICD-10-CM | POA: Diagnosis not present

## 2016-01-20 DIAGNOSIS — I129 Hypertensive chronic kidney disease with stage 1 through stage 4 chronic kidney disease, or unspecified chronic kidney disease: Secondary | ICD-10-CM | POA: Diagnosis not present

## 2016-01-20 DIAGNOSIS — E1122 Type 2 diabetes mellitus with diabetic chronic kidney disease: Secondary | ICD-10-CM | POA: Diagnosis not present

## 2016-01-20 DIAGNOSIS — S42402D Unspecified fracture of lower end of left humerus, subsequent encounter for fracture with routine healing: Secondary | ICD-10-CM | POA: Diagnosis not present

## 2016-01-20 DIAGNOSIS — S22060D Wedge compression fracture of T7-T8 vertebra, subsequent encounter for fracture with routine healing: Secondary | ICD-10-CM | POA: Diagnosis not present

## 2016-01-20 DIAGNOSIS — S22070D Wedge compression fracture of T9-T10 vertebra, subsequent encounter for fracture with routine healing: Secondary | ICD-10-CM | POA: Diagnosis not present

## 2016-01-20 DIAGNOSIS — N183 Chronic kidney disease, stage 3 (moderate): Secondary | ICD-10-CM | POA: Diagnosis not present

## 2016-01-20 DIAGNOSIS — I2510 Atherosclerotic heart disease of native coronary artery without angina pectoris: Secondary | ICD-10-CM | POA: Diagnosis not present

## 2016-01-20 DIAGNOSIS — Z8781 Personal history of (healed) traumatic fracture: Secondary | ICD-10-CM | POA: Diagnosis not present

## 2016-01-28 DIAGNOSIS — E1122 Type 2 diabetes mellitus with diabetic chronic kidney disease: Secondary | ICD-10-CM | POA: Diagnosis not present

## 2016-01-28 DIAGNOSIS — N183 Chronic kidney disease, stage 3 (moderate): Secondary | ICD-10-CM | POA: Diagnosis not present

## 2016-01-28 DIAGNOSIS — S22060D Wedge compression fracture of T7-T8 vertebra, subsequent encounter for fracture with routine healing: Secondary | ICD-10-CM | POA: Diagnosis not present

## 2016-01-28 DIAGNOSIS — S22070D Wedge compression fracture of T9-T10 vertebra, subsequent encounter for fracture with routine healing: Secondary | ICD-10-CM | POA: Diagnosis not present

## 2016-01-28 DIAGNOSIS — S42402D Unspecified fracture of lower end of left humerus, subsequent encounter for fracture with routine healing: Secondary | ICD-10-CM | POA: Diagnosis not present

## 2016-01-28 DIAGNOSIS — R296 Repeated falls: Secondary | ICD-10-CM | POA: Diagnosis not present

## 2016-01-28 DIAGNOSIS — I2510 Atherosclerotic heart disease of native coronary artery without angina pectoris: Secondary | ICD-10-CM | POA: Diagnosis not present

## 2016-01-28 DIAGNOSIS — I129 Hypertensive chronic kidney disease with stage 1 through stage 4 chronic kidney disease, or unspecified chronic kidney disease: Secondary | ICD-10-CM | POA: Diagnosis not present

## 2016-01-28 DIAGNOSIS — F0390 Unspecified dementia without behavioral disturbance: Secondary | ICD-10-CM | POA: Diagnosis not present

## 2016-02-08 DIAGNOSIS — Z7982 Long term (current) use of aspirin: Secondary | ICD-10-CM | POA: Diagnosis not present

## 2016-02-08 DIAGNOSIS — Z7902 Long term (current) use of antithrombotics/antiplatelets: Secondary | ICD-10-CM | POA: Diagnosis not present

## 2016-02-08 DIAGNOSIS — R4701 Aphasia: Secondary | ICD-10-CM | POA: Diagnosis not present

## 2016-02-08 DIAGNOSIS — M6281 Muscle weakness (generalized): Secondary | ICD-10-CM | POA: Diagnosis not present

## 2016-02-08 DIAGNOSIS — E119 Type 2 diabetes mellitus without complications: Secondary | ICD-10-CM | POA: Diagnosis not present

## 2016-02-08 DIAGNOSIS — G9340 Encephalopathy, unspecified: Secondary | ICD-10-CM | POA: Diagnosis not present

## 2016-02-08 DIAGNOSIS — E860 Dehydration: Secondary | ICD-10-CM | POA: Diagnosis not present

## 2016-02-08 DIAGNOSIS — I313 Pericardial effusion (noninflammatory): Secondary | ICD-10-CM | POA: Diagnosis not present

## 2016-02-08 DIAGNOSIS — E782 Mixed hyperlipidemia: Secondary | ICD-10-CM | POA: Diagnosis not present

## 2016-02-08 DIAGNOSIS — G9341 Metabolic encephalopathy: Secondary | ICD-10-CM | POA: Diagnosis not present

## 2016-02-08 DIAGNOSIS — F0390 Unspecified dementia without behavioral disturbance: Secondary | ICD-10-CM | POA: Diagnosis not present

## 2016-02-08 DIAGNOSIS — Z515 Encounter for palliative care: Secondary | ICD-10-CM | POA: Diagnosis not present

## 2016-02-08 DIAGNOSIS — E1165 Type 2 diabetes mellitus with hyperglycemia: Secondary | ICD-10-CM | POA: Diagnosis not present

## 2016-02-08 DIAGNOSIS — R404 Transient alteration of awareness: Secondary | ICD-10-CM | POA: Diagnosis not present

## 2016-02-08 DIAGNOSIS — I639 Cerebral infarction, unspecified: Secondary | ICD-10-CM | POA: Diagnosis not present

## 2016-02-08 DIAGNOSIS — R51 Headache: Secondary | ICD-10-CM | POA: Diagnosis not present

## 2016-02-08 DIAGNOSIS — R4182 Altered mental status, unspecified: Secondary | ICD-10-CM | POA: Diagnosis not present

## 2016-02-08 DIAGNOSIS — R079 Chest pain, unspecified: Secondary | ICD-10-CM | POA: Diagnosis not present

## 2016-02-08 DIAGNOSIS — I10 Essential (primary) hypertension: Secondary | ICD-10-CM | POA: Diagnosis not present

## 2016-02-08 DIAGNOSIS — I071 Rheumatic tricuspid insufficiency: Secondary | ICD-10-CM | POA: Diagnosis not present

## 2016-02-08 DIAGNOSIS — R410 Disorientation, unspecified: Secondary | ICD-10-CM | POA: Diagnosis not present

## 2016-02-18 DIAGNOSIS — R54 Age-related physical debility: Secondary | ICD-10-CM | POA: Diagnosis not present

## 2016-02-18 DIAGNOSIS — F0390 Unspecified dementia without behavioral disturbance: Secondary | ICD-10-CM | POA: Diagnosis not present

## 2016-02-18 DIAGNOSIS — R531 Weakness: Secondary | ICD-10-CM | POA: Diagnosis not present

## 2016-02-18 DIAGNOSIS — I639 Cerebral infarction, unspecified: Secondary | ICD-10-CM | POA: Diagnosis not present

## 2016-02-18 DIAGNOSIS — R410 Disorientation, unspecified: Secondary | ICD-10-CM | POA: Diagnosis not present

## 2016-02-18 DIAGNOSIS — G9340 Encephalopathy, unspecified: Secondary | ICD-10-CM | POA: Diagnosis not present

## 2016-02-20 DIAGNOSIS — F0390 Unspecified dementia without behavioral disturbance: Secondary | ICD-10-CM | POA: Diagnosis not present

## 2016-02-20 DIAGNOSIS — M1990 Unspecified osteoarthritis, unspecified site: Secondary | ICD-10-CM | POA: Diagnosis not present

## 2016-02-20 DIAGNOSIS — I10 Essential (primary) hypertension: Secondary | ICD-10-CM | POA: Diagnosis not present

## 2016-02-20 DIAGNOSIS — Z794 Long term (current) use of insulin: Secondary | ICD-10-CM | POA: Diagnosis not present

## 2016-02-20 DIAGNOSIS — E1165 Type 2 diabetes mellitus with hyperglycemia: Secondary | ICD-10-CM | POA: Diagnosis not present

## 2016-02-20 DIAGNOSIS — R1310 Dysphagia, unspecified: Secondary | ICD-10-CM | POA: Diagnosis not present

## 2016-02-21 DIAGNOSIS — Z8673 Personal history of transient ischemic attack (TIA), and cerebral infarction without residual deficits: Secondary | ICD-10-CM | POA: Diagnosis not present

## 2016-02-21 DIAGNOSIS — E876 Hypokalemia: Secondary | ICD-10-CM | POA: Diagnosis not present

## 2016-02-21 DIAGNOSIS — Z794 Long term (current) use of insulin: Secondary | ICD-10-CM | POA: Diagnosis not present

## 2016-02-21 DIAGNOSIS — G309 Alzheimer's disease, unspecified: Secondary | ICD-10-CM | POA: Diagnosis not present

## 2016-02-21 DIAGNOSIS — F0280 Dementia in other diseases classified elsewhere without behavioral disturbance: Secondary | ICD-10-CM | POA: Diagnosis not present

## 2016-02-21 DIAGNOSIS — E119 Type 2 diabetes mellitus without complications: Secondary | ICD-10-CM | POA: Diagnosis not present

## 2016-02-21 DIAGNOSIS — Z7982 Long term (current) use of aspirin: Secondary | ICD-10-CM | POA: Diagnosis not present

## 2016-02-21 DIAGNOSIS — Z7902 Long term (current) use of antithrombotics/antiplatelets: Secondary | ICD-10-CM | POA: Diagnosis not present

## 2016-02-21 DIAGNOSIS — R51 Headache: Secondary | ICD-10-CM | POA: Diagnosis not present

## 2016-02-21 DIAGNOSIS — I10 Essential (primary) hypertension: Secondary | ICD-10-CM | POA: Diagnosis not present

## 2016-02-22 DIAGNOSIS — I10 Essential (primary) hypertension: Secondary | ICD-10-CM | POA: Diagnosis not present

## 2016-02-22 DIAGNOSIS — F0390 Unspecified dementia without behavioral disturbance: Secondary | ICD-10-CM | POA: Diagnosis not present

## 2016-02-22 DIAGNOSIS — Z794 Long term (current) use of insulin: Secondary | ICD-10-CM | POA: Diagnosis not present

## 2016-02-22 DIAGNOSIS — E1165 Type 2 diabetes mellitus with hyperglycemia: Secondary | ICD-10-CM | POA: Diagnosis not present

## 2016-02-22 DIAGNOSIS — M1990 Unspecified osteoarthritis, unspecified site: Secondary | ICD-10-CM | POA: Diagnosis not present

## 2016-02-22 DIAGNOSIS — R1310 Dysphagia, unspecified: Secondary | ICD-10-CM | POA: Diagnosis not present

## 2016-02-23 DIAGNOSIS — R1310 Dysphagia, unspecified: Secondary | ICD-10-CM | POA: Diagnosis not present

## 2016-02-23 DIAGNOSIS — M1990 Unspecified osteoarthritis, unspecified site: Secondary | ICD-10-CM | POA: Diagnosis not present

## 2016-02-23 DIAGNOSIS — Z794 Long term (current) use of insulin: Secondary | ICD-10-CM | POA: Diagnosis not present

## 2016-02-23 DIAGNOSIS — I10 Essential (primary) hypertension: Secondary | ICD-10-CM | POA: Diagnosis not present

## 2016-02-23 DIAGNOSIS — F0390 Unspecified dementia without behavioral disturbance: Secondary | ICD-10-CM | POA: Diagnosis not present

## 2016-02-23 DIAGNOSIS — E1165 Type 2 diabetes mellitus with hyperglycemia: Secondary | ICD-10-CM | POA: Diagnosis not present

## 2016-02-25 DIAGNOSIS — F0390 Unspecified dementia without behavioral disturbance: Secondary | ICD-10-CM | POA: Diagnosis not present

## 2016-02-25 DIAGNOSIS — E1165 Type 2 diabetes mellitus with hyperglycemia: Secondary | ICD-10-CM | POA: Diagnosis not present

## 2016-02-25 DIAGNOSIS — M1990 Unspecified osteoarthritis, unspecified site: Secondary | ICD-10-CM | POA: Diagnosis not present

## 2016-02-25 DIAGNOSIS — R1310 Dysphagia, unspecified: Secondary | ICD-10-CM | POA: Diagnosis not present

## 2016-02-25 DIAGNOSIS — I10 Essential (primary) hypertension: Secondary | ICD-10-CM | POA: Diagnosis not present

## 2016-02-25 DIAGNOSIS — Z794 Long term (current) use of insulin: Secondary | ICD-10-CM | POA: Diagnosis not present

## 2016-02-26 DIAGNOSIS — R1310 Dysphagia, unspecified: Secondary | ICD-10-CM | POA: Diagnosis not present

## 2016-02-26 DIAGNOSIS — M1990 Unspecified osteoarthritis, unspecified site: Secondary | ICD-10-CM | POA: Diagnosis not present

## 2016-02-26 DIAGNOSIS — E1165 Type 2 diabetes mellitus with hyperglycemia: Secondary | ICD-10-CM | POA: Diagnosis not present

## 2016-02-26 DIAGNOSIS — I10 Essential (primary) hypertension: Secondary | ICD-10-CM | POA: Diagnosis not present

## 2016-02-26 DIAGNOSIS — F0390 Unspecified dementia without behavioral disturbance: Secondary | ICD-10-CM | POA: Diagnosis not present

## 2016-02-26 DIAGNOSIS — Z794 Long term (current) use of insulin: Secondary | ICD-10-CM | POA: Diagnosis not present

## 2016-02-29 DIAGNOSIS — F0390 Unspecified dementia without behavioral disturbance: Secondary | ICD-10-CM | POA: Diagnosis not present

## 2016-02-29 DIAGNOSIS — I10 Essential (primary) hypertension: Secondary | ICD-10-CM | POA: Diagnosis not present

## 2016-02-29 DIAGNOSIS — M1990 Unspecified osteoarthritis, unspecified site: Secondary | ICD-10-CM | POA: Diagnosis not present

## 2016-02-29 DIAGNOSIS — E1165 Type 2 diabetes mellitus with hyperglycemia: Secondary | ICD-10-CM | POA: Diagnosis not present

## 2016-02-29 DIAGNOSIS — Z794 Long term (current) use of insulin: Secondary | ICD-10-CM | POA: Diagnosis not present

## 2016-02-29 DIAGNOSIS — R1310 Dysphagia, unspecified: Secondary | ICD-10-CM | POA: Diagnosis not present

## 2016-03-03 DIAGNOSIS — I10 Essential (primary) hypertension: Secondary | ICD-10-CM | POA: Diagnosis not present

## 2016-03-03 DIAGNOSIS — F0390 Unspecified dementia without behavioral disturbance: Secondary | ICD-10-CM | POA: Diagnosis not present

## 2016-03-03 DIAGNOSIS — M1990 Unspecified osteoarthritis, unspecified site: Secondary | ICD-10-CM | POA: Diagnosis not present

## 2016-03-03 DIAGNOSIS — Z794 Long term (current) use of insulin: Secondary | ICD-10-CM | POA: Diagnosis not present

## 2016-03-03 DIAGNOSIS — E1165 Type 2 diabetes mellitus with hyperglycemia: Secondary | ICD-10-CM | POA: Diagnosis not present

## 2016-03-03 DIAGNOSIS — R1310 Dysphagia, unspecified: Secondary | ICD-10-CM | POA: Diagnosis not present

## 2016-03-04 DIAGNOSIS — I10 Essential (primary) hypertension: Secondary | ICD-10-CM | POA: Diagnosis not present

## 2016-03-04 DIAGNOSIS — M1990 Unspecified osteoarthritis, unspecified site: Secondary | ICD-10-CM | POA: Diagnosis not present

## 2016-03-04 DIAGNOSIS — Z794 Long term (current) use of insulin: Secondary | ICD-10-CM | POA: Diagnosis not present

## 2016-03-04 DIAGNOSIS — F0390 Unspecified dementia without behavioral disturbance: Secondary | ICD-10-CM | POA: Diagnosis not present

## 2016-03-04 DIAGNOSIS — E1165 Type 2 diabetes mellitus with hyperglycemia: Secondary | ICD-10-CM | POA: Diagnosis not present

## 2016-03-04 DIAGNOSIS — R1310 Dysphagia, unspecified: Secondary | ICD-10-CM | POA: Diagnosis not present

## 2016-03-07 DIAGNOSIS — I10 Essential (primary) hypertension: Secondary | ICD-10-CM | POA: Diagnosis not present

## 2016-03-07 DIAGNOSIS — M1990 Unspecified osteoarthritis, unspecified site: Secondary | ICD-10-CM | POA: Diagnosis not present

## 2016-03-07 DIAGNOSIS — E1165 Type 2 diabetes mellitus with hyperglycemia: Secondary | ICD-10-CM | POA: Diagnosis not present

## 2016-03-07 DIAGNOSIS — R1310 Dysphagia, unspecified: Secondary | ICD-10-CM | POA: Diagnosis not present

## 2016-03-07 DIAGNOSIS — Z794 Long term (current) use of insulin: Secondary | ICD-10-CM | POA: Diagnosis not present

## 2016-03-07 DIAGNOSIS — F0390 Unspecified dementia without behavioral disturbance: Secondary | ICD-10-CM | POA: Diagnosis not present

## 2016-03-08 DIAGNOSIS — E1165 Type 2 diabetes mellitus with hyperglycemia: Secondary | ICD-10-CM | POA: Diagnosis not present

## 2016-03-08 DIAGNOSIS — R1310 Dysphagia, unspecified: Secondary | ICD-10-CM | POA: Diagnosis not present

## 2016-03-08 DIAGNOSIS — Z794 Long term (current) use of insulin: Secondary | ICD-10-CM | POA: Diagnosis not present

## 2016-03-08 DIAGNOSIS — I10 Essential (primary) hypertension: Secondary | ICD-10-CM | POA: Diagnosis not present

## 2016-03-08 DIAGNOSIS — F0390 Unspecified dementia without behavioral disturbance: Secondary | ICD-10-CM | POA: Diagnosis not present

## 2016-03-08 DIAGNOSIS — M1990 Unspecified osteoarthritis, unspecified site: Secondary | ICD-10-CM | POA: Diagnosis not present

## 2016-03-09 DIAGNOSIS — F0390 Unspecified dementia without behavioral disturbance: Secondary | ICD-10-CM | POA: Diagnosis not present

## 2016-03-09 DIAGNOSIS — E1165 Type 2 diabetes mellitus with hyperglycemia: Secondary | ICD-10-CM | POA: Diagnosis not present

## 2016-03-09 DIAGNOSIS — I10 Essential (primary) hypertension: Secondary | ICD-10-CM | POA: Diagnosis not present

## 2016-03-09 DIAGNOSIS — R1310 Dysphagia, unspecified: Secondary | ICD-10-CM | POA: Diagnosis not present

## 2016-03-09 DIAGNOSIS — M1990 Unspecified osteoarthritis, unspecified site: Secondary | ICD-10-CM | POA: Diagnosis not present

## 2016-03-09 DIAGNOSIS — Z794 Long term (current) use of insulin: Secondary | ICD-10-CM | POA: Diagnosis not present

## 2016-03-10 DIAGNOSIS — Z794 Long term (current) use of insulin: Secondary | ICD-10-CM | POA: Diagnosis not present

## 2016-03-10 DIAGNOSIS — I10 Essential (primary) hypertension: Secondary | ICD-10-CM | POA: Diagnosis not present

## 2016-03-10 DIAGNOSIS — F0390 Unspecified dementia without behavioral disturbance: Secondary | ICD-10-CM | POA: Diagnosis not present

## 2016-03-10 DIAGNOSIS — M1990 Unspecified osteoarthritis, unspecified site: Secondary | ICD-10-CM | POA: Diagnosis not present

## 2016-03-10 DIAGNOSIS — R1310 Dysphagia, unspecified: Secondary | ICD-10-CM | POA: Diagnosis not present

## 2016-03-10 DIAGNOSIS — E1165 Type 2 diabetes mellitus with hyperglycemia: Secondary | ICD-10-CM | POA: Diagnosis not present

## 2016-03-14 DIAGNOSIS — F0390 Unspecified dementia without behavioral disturbance: Secondary | ICD-10-CM | POA: Diagnosis not present

## 2016-03-14 DIAGNOSIS — E1165 Type 2 diabetes mellitus with hyperglycemia: Secondary | ICD-10-CM | POA: Diagnosis not present

## 2016-03-14 DIAGNOSIS — Z794 Long term (current) use of insulin: Secondary | ICD-10-CM | POA: Diagnosis not present

## 2016-03-14 DIAGNOSIS — I10 Essential (primary) hypertension: Secondary | ICD-10-CM | POA: Diagnosis not present

## 2016-03-14 DIAGNOSIS — M1990 Unspecified osteoarthritis, unspecified site: Secondary | ICD-10-CM | POA: Diagnosis not present

## 2016-03-14 DIAGNOSIS — R1310 Dysphagia, unspecified: Secondary | ICD-10-CM | POA: Diagnosis not present

## 2016-03-16 DIAGNOSIS — Z794 Long term (current) use of insulin: Secondary | ICD-10-CM | POA: Diagnosis not present

## 2016-03-16 DIAGNOSIS — E1165 Type 2 diabetes mellitus with hyperglycemia: Secondary | ICD-10-CM | POA: Diagnosis not present

## 2016-03-16 DIAGNOSIS — R1310 Dysphagia, unspecified: Secondary | ICD-10-CM | POA: Diagnosis not present

## 2016-03-16 DIAGNOSIS — I10 Essential (primary) hypertension: Secondary | ICD-10-CM | POA: Diagnosis not present

## 2016-03-16 DIAGNOSIS — M1990 Unspecified osteoarthritis, unspecified site: Secondary | ICD-10-CM | POA: Diagnosis not present

## 2016-03-16 DIAGNOSIS — F0390 Unspecified dementia without behavioral disturbance: Secondary | ICD-10-CM | POA: Diagnosis not present

## 2016-03-21 DIAGNOSIS — M1990 Unspecified osteoarthritis, unspecified site: Secondary | ICD-10-CM | POA: Diagnosis not present

## 2016-03-21 DIAGNOSIS — I10 Essential (primary) hypertension: Secondary | ICD-10-CM | POA: Diagnosis not present

## 2016-03-21 DIAGNOSIS — E1165 Type 2 diabetes mellitus with hyperglycemia: Secondary | ICD-10-CM | POA: Diagnosis not present

## 2016-03-21 DIAGNOSIS — R1310 Dysphagia, unspecified: Secondary | ICD-10-CM | POA: Diagnosis not present

## 2016-03-21 DIAGNOSIS — F0390 Unspecified dementia without behavioral disturbance: Secondary | ICD-10-CM | POA: Diagnosis not present

## 2016-03-21 DIAGNOSIS — Z794 Long term (current) use of insulin: Secondary | ICD-10-CM | POA: Diagnosis not present

## 2016-03-22 DIAGNOSIS — F0390 Unspecified dementia without behavioral disturbance: Secondary | ICD-10-CM | POA: Diagnosis not present

## 2016-03-22 DIAGNOSIS — Z794 Long term (current) use of insulin: Secondary | ICD-10-CM | POA: Diagnosis not present

## 2016-03-22 DIAGNOSIS — I10 Essential (primary) hypertension: Secondary | ICD-10-CM | POA: Diagnosis not present

## 2016-03-22 DIAGNOSIS — M1990 Unspecified osteoarthritis, unspecified site: Secondary | ICD-10-CM | POA: Diagnosis not present

## 2016-03-22 DIAGNOSIS — E1165 Type 2 diabetes mellitus with hyperglycemia: Secondary | ICD-10-CM | POA: Diagnosis not present

## 2016-03-22 DIAGNOSIS — R1310 Dysphagia, unspecified: Secondary | ICD-10-CM | POA: Diagnosis not present

## 2016-03-24 DIAGNOSIS — M1990 Unspecified osteoarthritis, unspecified site: Secondary | ICD-10-CM | POA: Diagnosis not present

## 2016-03-24 DIAGNOSIS — E1165 Type 2 diabetes mellitus with hyperglycemia: Secondary | ICD-10-CM | POA: Diagnosis not present

## 2016-03-24 DIAGNOSIS — R1310 Dysphagia, unspecified: Secondary | ICD-10-CM | POA: Diagnosis not present

## 2016-03-24 DIAGNOSIS — Z794 Long term (current) use of insulin: Secondary | ICD-10-CM | POA: Diagnosis not present

## 2016-03-24 DIAGNOSIS — F0390 Unspecified dementia without behavioral disturbance: Secondary | ICD-10-CM | POA: Diagnosis not present

## 2016-03-24 DIAGNOSIS — I10 Essential (primary) hypertension: Secondary | ICD-10-CM | POA: Diagnosis not present

## 2016-03-28 DIAGNOSIS — Z794 Long term (current) use of insulin: Secondary | ICD-10-CM | POA: Diagnosis not present

## 2016-03-28 DIAGNOSIS — F0390 Unspecified dementia without behavioral disturbance: Secondary | ICD-10-CM | POA: Diagnosis not present

## 2016-03-28 DIAGNOSIS — I10 Essential (primary) hypertension: Secondary | ICD-10-CM | POA: Diagnosis not present

## 2016-03-28 DIAGNOSIS — M1990 Unspecified osteoarthritis, unspecified site: Secondary | ICD-10-CM | POA: Diagnosis not present

## 2016-03-28 DIAGNOSIS — E1165 Type 2 diabetes mellitus with hyperglycemia: Secondary | ICD-10-CM | POA: Diagnosis not present

## 2016-03-28 DIAGNOSIS — R1310 Dysphagia, unspecified: Secondary | ICD-10-CM | POA: Diagnosis not present

## 2016-03-30 DIAGNOSIS — F0390 Unspecified dementia without behavioral disturbance: Secondary | ICD-10-CM | POA: Diagnosis not present

## 2016-03-30 DIAGNOSIS — M1990 Unspecified osteoarthritis, unspecified site: Secondary | ICD-10-CM | POA: Diagnosis not present

## 2016-03-30 DIAGNOSIS — Z794 Long term (current) use of insulin: Secondary | ICD-10-CM | POA: Diagnosis not present

## 2016-03-30 DIAGNOSIS — R1310 Dysphagia, unspecified: Secondary | ICD-10-CM | POA: Diagnosis not present

## 2016-03-30 DIAGNOSIS — I10 Essential (primary) hypertension: Secondary | ICD-10-CM | POA: Diagnosis not present

## 2016-03-30 DIAGNOSIS — E1165 Type 2 diabetes mellitus with hyperglycemia: Secondary | ICD-10-CM | POA: Diagnosis not present

## 2016-03-31 DIAGNOSIS — F0390 Unspecified dementia without behavioral disturbance: Secondary | ICD-10-CM | POA: Diagnosis not present

## 2016-03-31 DIAGNOSIS — E1165 Type 2 diabetes mellitus with hyperglycemia: Secondary | ICD-10-CM | POA: Diagnosis not present

## 2016-03-31 DIAGNOSIS — I10 Essential (primary) hypertension: Secondary | ICD-10-CM | POA: Diagnosis not present

## 2016-03-31 DIAGNOSIS — M1990 Unspecified osteoarthritis, unspecified site: Secondary | ICD-10-CM | POA: Diagnosis not present

## 2016-03-31 DIAGNOSIS — Z794 Long term (current) use of insulin: Secondary | ICD-10-CM | POA: Diagnosis not present

## 2016-03-31 DIAGNOSIS — R1310 Dysphagia, unspecified: Secondary | ICD-10-CM | POA: Diagnosis not present

## 2016-04-03 DIAGNOSIS — Z794 Long term (current) use of insulin: Secondary | ICD-10-CM | POA: Diagnosis not present

## 2016-04-03 DIAGNOSIS — E1165 Type 2 diabetes mellitus with hyperglycemia: Secondary | ICD-10-CM | POA: Diagnosis not present

## 2016-04-03 DIAGNOSIS — F0390 Unspecified dementia without behavioral disturbance: Secondary | ICD-10-CM | POA: Diagnosis not present

## 2016-04-03 DIAGNOSIS — R1310 Dysphagia, unspecified: Secondary | ICD-10-CM | POA: Diagnosis not present

## 2016-04-03 DIAGNOSIS — M1990 Unspecified osteoarthritis, unspecified site: Secondary | ICD-10-CM | POA: Diagnosis not present

## 2016-04-03 DIAGNOSIS — I10 Essential (primary) hypertension: Secondary | ICD-10-CM | POA: Diagnosis not present

## 2016-04-04 DIAGNOSIS — Z794 Long term (current) use of insulin: Secondary | ICD-10-CM | POA: Diagnosis not present

## 2016-04-04 DIAGNOSIS — F0390 Unspecified dementia without behavioral disturbance: Secondary | ICD-10-CM | POA: Diagnosis not present

## 2016-04-04 DIAGNOSIS — E1165 Type 2 diabetes mellitus with hyperglycemia: Secondary | ICD-10-CM | POA: Diagnosis not present

## 2016-04-04 DIAGNOSIS — R1310 Dysphagia, unspecified: Secondary | ICD-10-CM | POA: Diagnosis not present

## 2016-04-04 DIAGNOSIS — I10 Essential (primary) hypertension: Secondary | ICD-10-CM | POA: Diagnosis not present

## 2016-04-04 DIAGNOSIS — M1990 Unspecified osteoarthritis, unspecified site: Secondary | ICD-10-CM | POA: Diagnosis not present

## 2016-04-06 DIAGNOSIS — I10 Essential (primary) hypertension: Secondary | ICD-10-CM | POA: Diagnosis not present

## 2016-04-06 DIAGNOSIS — E1165 Type 2 diabetes mellitus with hyperglycemia: Secondary | ICD-10-CM | POA: Diagnosis not present

## 2016-04-06 DIAGNOSIS — R1310 Dysphagia, unspecified: Secondary | ICD-10-CM | POA: Diagnosis not present

## 2016-04-06 DIAGNOSIS — F0390 Unspecified dementia without behavioral disturbance: Secondary | ICD-10-CM | POA: Diagnosis not present

## 2016-04-06 DIAGNOSIS — M1990 Unspecified osteoarthritis, unspecified site: Secondary | ICD-10-CM | POA: Diagnosis not present

## 2016-04-06 DIAGNOSIS — Z794 Long term (current) use of insulin: Secondary | ICD-10-CM | POA: Diagnosis not present

## 2016-04-10 DIAGNOSIS — F0390 Unspecified dementia without behavioral disturbance: Secondary | ICD-10-CM | POA: Diagnosis not present

## 2016-04-10 DIAGNOSIS — M1990 Unspecified osteoarthritis, unspecified site: Secondary | ICD-10-CM | POA: Diagnosis not present

## 2016-04-10 DIAGNOSIS — E1165 Type 2 diabetes mellitus with hyperglycemia: Secondary | ICD-10-CM | POA: Diagnosis not present

## 2016-04-10 DIAGNOSIS — R1310 Dysphagia, unspecified: Secondary | ICD-10-CM | POA: Diagnosis not present

## 2016-04-10 DIAGNOSIS — I10 Essential (primary) hypertension: Secondary | ICD-10-CM | POA: Diagnosis not present

## 2016-04-10 DIAGNOSIS — Z794 Long term (current) use of insulin: Secondary | ICD-10-CM | POA: Diagnosis not present

## 2016-04-12 DIAGNOSIS — M1990 Unspecified osteoarthritis, unspecified site: Secondary | ICD-10-CM | POA: Diagnosis not present

## 2016-04-12 DIAGNOSIS — F0390 Unspecified dementia without behavioral disturbance: Secondary | ICD-10-CM | POA: Diagnosis not present

## 2016-04-12 DIAGNOSIS — R1310 Dysphagia, unspecified: Secondary | ICD-10-CM | POA: Diagnosis not present

## 2016-04-12 DIAGNOSIS — Z794 Long term (current) use of insulin: Secondary | ICD-10-CM | POA: Diagnosis not present

## 2016-04-12 DIAGNOSIS — I10 Essential (primary) hypertension: Secondary | ICD-10-CM | POA: Diagnosis not present

## 2016-04-12 DIAGNOSIS — E1165 Type 2 diabetes mellitus with hyperglycemia: Secondary | ICD-10-CM | POA: Diagnosis not present

## 2016-04-19 DIAGNOSIS — E1165 Type 2 diabetes mellitus with hyperglycemia: Secondary | ICD-10-CM | POA: Diagnosis not present

## 2016-04-19 DIAGNOSIS — R1310 Dysphagia, unspecified: Secondary | ICD-10-CM | POA: Diagnosis not present

## 2016-04-19 DIAGNOSIS — F0390 Unspecified dementia without behavioral disturbance: Secondary | ICD-10-CM | POA: Diagnosis not present

## 2016-04-19 DIAGNOSIS — M1990 Unspecified osteoarthritis, unspecified site: Secondary | ICD-10-CM | POA: Diagnosis not present

## 2016-04-19 DIAGNOSIS — I10 Essential (primary) hypertension: Secondary | ICD-10-CM | POA: Diagnosis not present

## 2016-04-19 DIAGNOSIS — Z794 Long term (current) use of insulin: Secondary | ICD-10-CM | POA: Diagnosis not present

## 2017-03-05 IMAGING — CT CT ELBOW*L* W/O CM
2 series · 10 of 14 positions shown, 12 images · non-contrast
Comparison: None.

CLINICAL DATA: Left elbow pain status post fall.

EXAM:
CT OF THE LEFT ELBOW WITHOUT CONTRAST
TECHNIQUE: Multidetector CT imaging was performed according to the standard
protocol. Multiplanar CT image reconstructions were also generated.

[Series 2: bone windows · axial · 0.37mm/px · z∈[-720,-622]mm · 4 of 57 slices shown]
[im 12/57  bone]
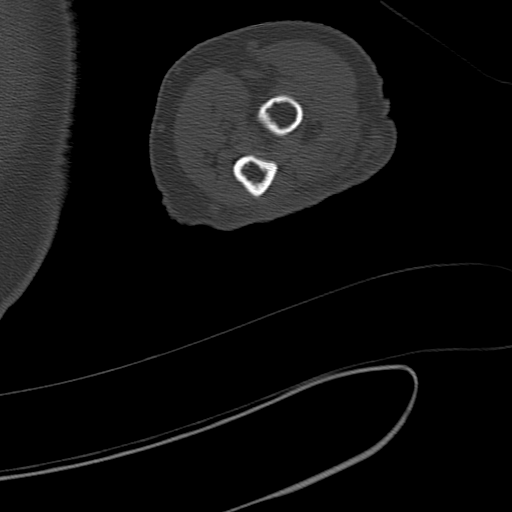
[im 23/57  bone]
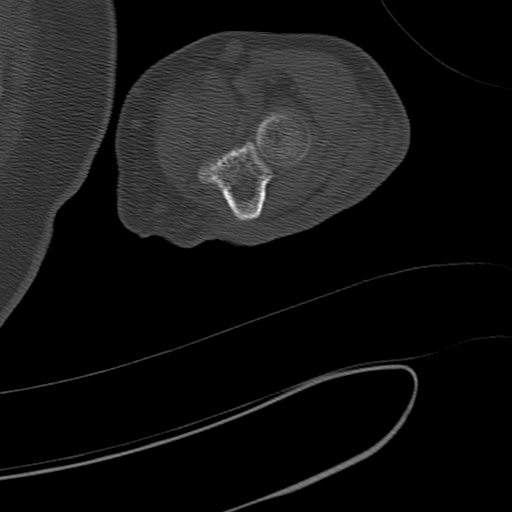
[im 34/57  bone]
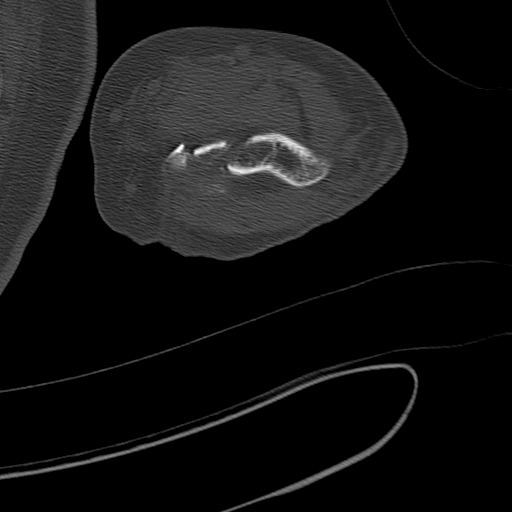
[im 45/57  bone]
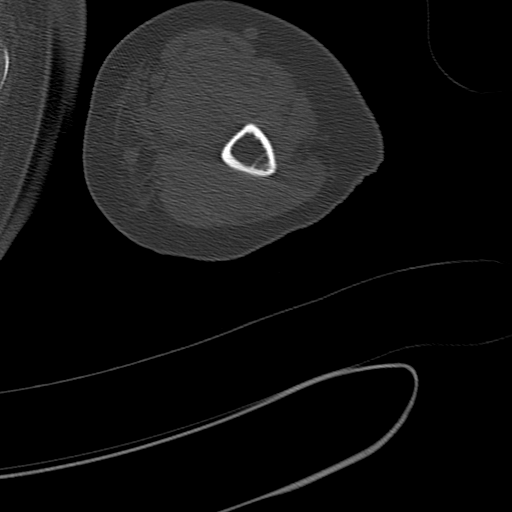

[Series 8: axials bone 2 · axial · 0.25mm/px · z∈[-733,-613]mm · 6 of 86 slices shown, 8 images]
[im 13/86  soft-tissue]
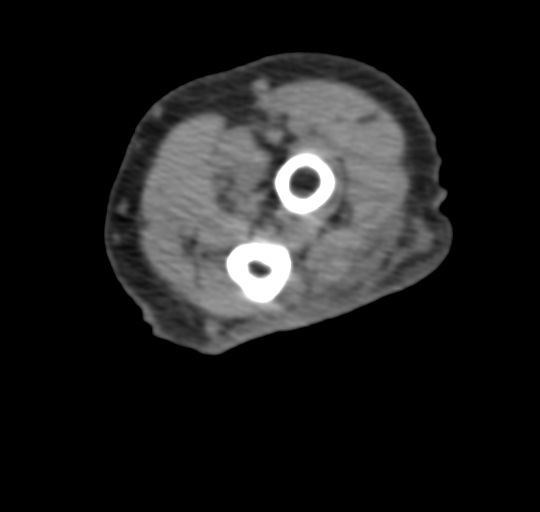
[im 13/86  bone]
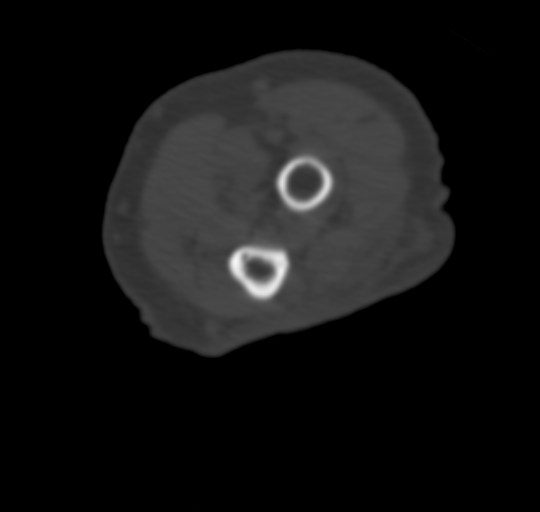
[im 25/86  bone]
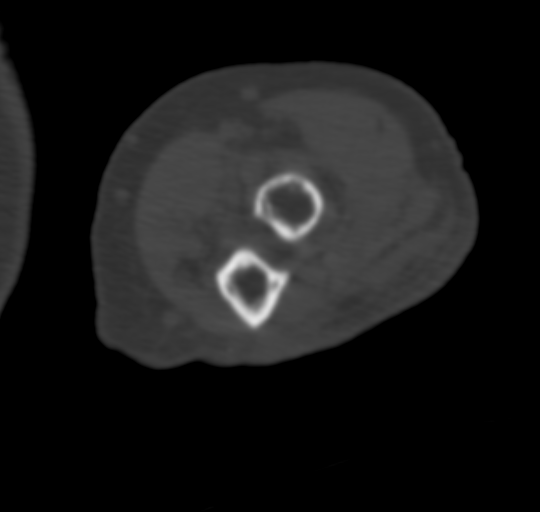
[im 37/86  bone]
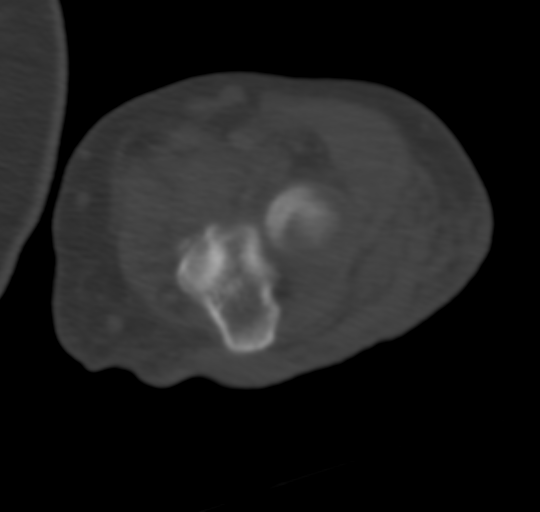
[im 49/86  bone]
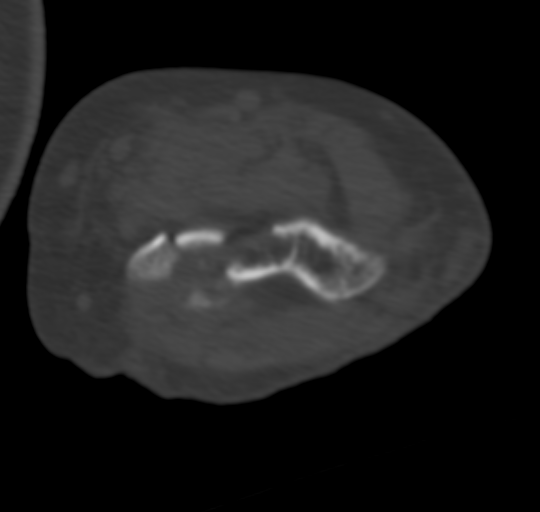
[im 61/86  soft-tissue]
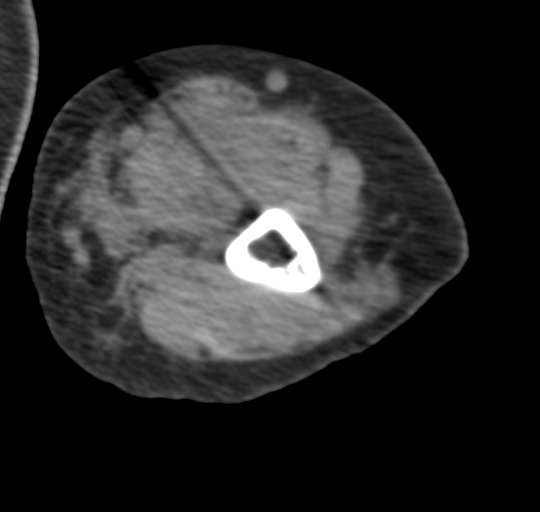
[im 61/86  bone]
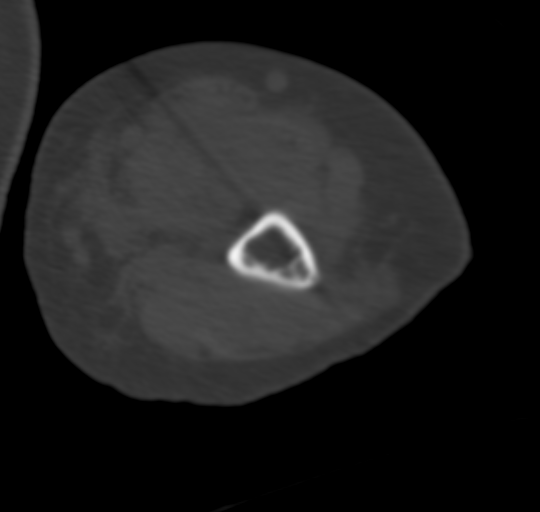
[im 73/86  bone]
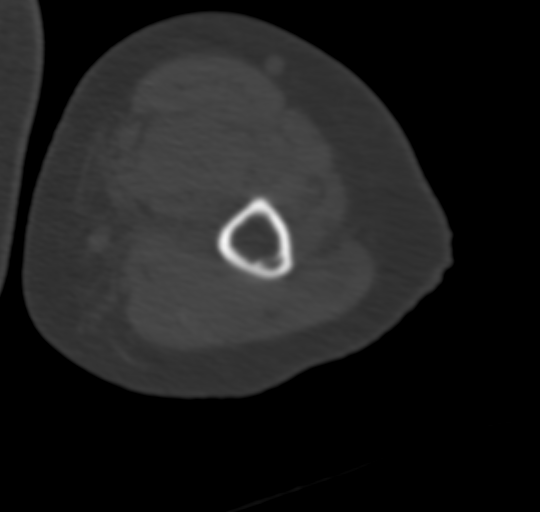

[10 of 14 positions shown; findings below may reference images not displayed]

FINDINGS: There is a comminuted and impacted fracture of the medial distal
humeral epicondyle with 10 mm of distraction between the major
fracture fragments medially. There is a mildly comminuted fracture
of the lateral distal humeral epicondyle. There is a transcondylar
fracture cleft. There is a small fracture fragment adjacent to the
olecranon fossa. There is no elbow dislocation. There is severe soft
tissue contusion posterior to the elbow most severe posterior
laterally.

The radial head is intact. The proximal ulna is intact. The muscles
are normal.
IMPRESSION: 1. Comminuted and impacted fracture of the medial distal humeral
epicondyle with 10 mm of distraction between the major fracture
fragments medially. Impacted fracture fragment extends into the
olecranon fossa.
2. Mildly comminuted fracture of the lateral distal humeral
epicondyle.

## 2017-08-07 ENCOUNTER — Emergency Department
Admission: EM | Admit: 2017-08-07 | Discharge: 2017-08-08 | Disposition: A | Discharge status: Home / Self Care | Payer: Medicare HMO | Attending: Emergency Medicine | Admitting: Emergency Medicine

## 2017-08-07 ENCOUNTER — Emergency Department: Payer: Medicare HMO

## 2017-08-07 ENCOUNTER — Encounter: Payer: Self-pay | Admitting: Emergency Medicine

## 2017-08-07 DIAGNOSIS — Z794 Long term (current) use of insulin: Secondary | ICD-10-CM | POA: Insufficient documentation

## 2017-08-07 DIAGNOSIS — Z79899 Other long term (current) drug therapy: Secondary | ICD-10-CM | POA: Diagnosis not present

## 2017-08-07 DIAGNOSIS — E860 Dehydration: Secondary | ICD-10-CM | POA: Diagnosis not present

## 2017-08-07 DIAGNOSIS — E119 Type 2 diabetes mellitus without complications: Secondary | ICD-10-CM | POA: Insufficient documentation

## 2017-08-07 DIAGNOSIS — Z7982 Long term (current) use of aspirin: Secondary | ICD-10-CM | POA: Diagnosis not present

## 2017-08-07 DIAGNOSIS — Z8673 Personal history of transient ischemic attack (TIA), and cerebral infarction without residual deficits: Secondary | ICD-10-CM | POA: Insufficient documentation

## 2017-08-07 DIAGNOSIS — R0602 Shortness of breath: Secondary | ICD-10-CM | POA: Insufficient documentation

## 2017-08-07 HISTORY — DX: Gastro-esophageal reflux disease without esophagitis: K21.9

## 2017-08-07 LAB — CBC WITH DIFFERENTIAL/PLATELET
BASOS ABS: 0.1 10*3/uL (ref 0–0.1)
Basophils Relative: 1 %
EOS ABS: 0.3 10*3/uL (ref 0–0.7)
EOS PCT: 3 %
HCT: 33.6 % — ABNORMAL LOW (ref 35.0–47.0)
Hemoglobin: 11.6 g/dL — ABNORMAL LOW (ref 12.0–16.0)
Lymphocytes Relative: 18 %
Lymphs Abs: 1.6 10*3/uL (ref 1.0–3.6)
MCH: 28.8 pg (ref 26.0–34.0)
MCHC: 34.6 g/dL (ref 32.0–36.0)
MCV: 83.4 fL (ref 80.0–100.0)
Monocytes Absolute: 0.7 10*3/uL (ref 0.2–0.9)
Monocytes Relative: 8 %
Neutro Abs: 6.2 10*3/uL (ref 1.4–6.5)
Neutrophils Relative %: 70 %
PLATELETS: 368 10*3/uL (ref 150–440)
RBC: 4.03 MIL/uL (ref 3.80–5.20)
RDW: 14.8 % — ABNORMAL HIGH (ref 11.5–14.5)
WBC: 8.8 10*3/uL (ref 3.6–11.0)

## 2017-08-07 LAB — BASIC METABOLIC PANEL
Anion gap: 10 (ref 5–15)
BUN: 41 mg/dL — AB (ref 6–20)
CALCIUM: 9.7 mg/dL (ref 8.9–10.3)
CO2: 25 mmol/L (ref 22–32)
Chloride: 102 mmol/L (ref 101–111)
Creatinine, Ser: 1.97 mg/dL — ABNORMAL HIGH (ref 0.44–1.00)
GFR calc Af Amer: 26 mL/min — ABNORMAL LOW (ref >60)
GFR, EST NON AFRICAN AMERICAN: 22 mL/min — AB (ref >60)
GLUCOSE: 141 mg/dL — AB (ref 65–99)
POTASSIUM: 4 mmol/L (ref 3.5–5.1)
SODIUM: 137 mmol/L (ref 135–145)

## 2017-08-07 LAB — FIBRIN DERIVATIVES D-DIMER (ARMC ONLY): Fibrin derivatives D-dimer (ARMC): 568.39 — ABNORMAL HIGH (ref 0.00–499.00)

## 2017-08-07 LAB — TROPONIN I
Troponin I: <0.03 ng/mL (ref <0.03)
Troponin I: <0.03 ng/mL (ref <0.03)

## 2017-08-07 MED ORDER — SODIUM CHLORIDE 0.9 % IV BOLUS (SEPSIS)
1000.0000 mL | Freq: Once | INTRAVENOUS | Status: AC
  Administered 2017-08-07: 1000 mL via INTRAVENOUS

## 2017-08-07 NOTE — ED Notes (Signed)
Pt denies feeling SOB. No chest pain at this time. Pt able to talk without increased WOB. NAD noted. Family remains at bedside.

## 2017-08-07 NOTE — ED Provider Notes (Signed)
[facility] Emergency Department Provider Note  ____________________________________________  Time seen: Approximately 7:55 PM  I have reviewed the triage vital signs and the nursing notes.   HISTORY  Chief Complaint Shortness of Breath   HPI Carrie Craig is a 83 y.o. female with a history of stroke on Plavix, diabetes, hypertension, hyperlipidemia, GERD who presents for evaluation of shortness of breath. Patient reports that this has been an ongoing issue for several weeks. She went to see her primary care doctor who told her that her breathing was fine. Patient reports that 2 days ago her sister passed away rather suddenly and it seems like the doctors were concerned that she had a massive heart attack. Patient since then has been feeling more short of breath. Today she was sitting in a chair when she had a more severe episode of shortness of breath and was afraid that she was going to die just like her sister which prompted EMS to be called. Patient has no history of heart failure, COPD or asthma. She denies wheezing, cough, orthopnea, leg swelling, personal or family history of blood clot, recent travel or immobilization, leg pain or swelling, hemoptysis. No history of smoking. She reports that the shortness of breath is not worse with exertion but it is worse when she is talking on the phone. She denies feeling anxious but she does feel afraid after losing her sister so suddenly.  Past Medical History:  Diagnosis Date  . Diabetes mellitus without complication ([place])   . GERD (gastroesophageal reflux disease)   . Stroke [facility])     Patient Active Problem List   Diagnosis Date Noted  . UTI (urinary tract infection) 11/25/2015  . Thoracic compression fracture ([place]) 11/25/2015    History reviewed. No pertinent surgical history.  Prior to Admission medications   Medication Sig Start Date End Date Taking? Authorizing Provider  aspirin EC 81 MG tablet Take 81  mg by mouth daily.    [provider]  atorvastatin (LIPITOR) 40 MG tablet Take 40 mg by mouth at bedtime.    [provider]  citalopram (CELEXA) 20 MG tablet Take 20 mg by mouth daily.    [provider]  cyclobenzaprine (FLEXERIL) 5 MG tablet Take 5 mg by mouth 3 (three) times daily as needed for muscle spasms.    [provider]  docusate sodium (COLACE) 100 MG capsule Take 100 mg by mouth 2 (two) times daily.    [provider]  insulin aspart (NOVOLOG) 100 UNIT/ML injection Inject 0-9 Units into the skin 3 (three) times daily with meals. 11/25/15   Aldean Jewett, MD  insulin glargine (LANTUS) 100 UNIT/ML injection Inject 0.06 mLs (6 Units total) into the skin at bedtime. 11/25/15   Aldean Jewett, MD  metoprolol tartrate (LOPRESSOR) 25 MG tablet Take 25 mg by mouth 2 (two) times daily.    [provider]  Omega-3 Fatty Acids (FISH OIL) 1000 MG CAPS Take 1,000 mg by mouth 2 (two) times daily.    [provider]  pantoprazole (PROTONIX) 40 MG tablet Take 40 mg by mouth daily.    [provider]  traMADol (ULTRAM) 50 MG tablet Take 50 mg by mouth every 6 (six) hours as needed for moderate pain.    [provider]    Allergies Patient has no known allergies.  No family history on file.  Social History Social History  Substance Use Topics  . Smoking status: Never Smoker  . Smokeless  tobacco: Never Used  . Alcohol use No    Review of Systems  Constitutional: Negative for fever. Eyes: Negative for visual changes. ENT: Negative for sore throat. Neck: No neck pain  Cardiovascular: Negative for chest pain. Respiratory: + shortness of breath. Gastrointestinal: Negative for abdominal pain, vomiting or diarrhea. Genitourinary: Negative for dysuria. Musculoskeletal: Negative for back pain. Skin: Negative for rash. Neurological: Negative for headaches, weakness or numbness. Psych: No SI or  HI  ____________________________________________   PHYSICAL EXAM:  VITAL SIGNS: Vitals:   08/07/17 2330 08/08/17 0030  BP: (!) 163/76 (!) 157/81  Pulse: 81 77  Resp: (!) 21 16  Temp:    SpO2: 98% 96%    Constitutional: Alert and oriented. Well appearing and in no apparent distress. HEENT:      Head: Normocephalic and atraumatic.         Eyes: Conjunctivae are normal. Sclera is non-icteric.       Mouth/Throat: Mucous membranes are moist.       Neck: Supple with no signs of meningismus. Cardiovascular: Regular rate and rhythm. No murmurs, gallops, or rubs. 2+ symmetrical distal pulses are present in all extremities. No JVD. Respiratory: Normal respiratory effort. Lungs are clear to auscultation bilaterally. No wheezes, crackles, or rhonchi.  Gastrointestinal: Soft, non tender, and non distended with positive bowel sounds. No rebound or guarding. Genitourinary: No CVA tenderness. Musculoskeletal: Nontender with normal range of motion in all extremities. No edema, cyanosis, or erythema of extremities. Neurologic: Normal speech and language. Face is symmetric. Moving all extremities. No gross focal neurologic deficits are appreciated. Skin: Skin is warm, dry and intact. No rash noted. Psychiatric: Mood and affect are normal. Speech and behavior are normal.  ____________________________________________   LABS (all labs ordered are listed, but only abnormal results are displayed)  Labs Reviewed  CBC WITH DIFFERENTIAL/PLATELET - Abnormal; Notable for the following:       Result Value   Hemoglobin 11.6 (*)    HCT 33.6 (*)    RDW 14.8 (*)    All other components within normal limits  BASIC METABOLIC PANEL - Abnormal; Notable for the following:    Glucose, Bld 141 (*)    BUN 41 (*)    Creatinine, Ser 1.97 (*)    GFR calc non Af Amer 22 (*)    GFR calc Af Amer 26 (*)    All other components within normal limits  FIBRIN DERIVATIVES D-DIMER ([facility] ONLY) - Abnormal; Notable for the  following:    Fibrin derivatives D-dimer ([facility]) 568.39 (*)    All other components within normal limits  BASIC METABOLIC PANEL - Abnormal; Notable for the following:    Glucose, Bld 109 (*)    BUN 37 (*)    Creatinine, Ser 1.82 (*)    GFR calc non Af Amer 25 (*)    GFR calc Af Amer 28 (*)    All other components within normal limits  TROPONIN I  TROPONIN I   ____________________________________________  EKG  ED ECG REPORT I, Rudene Re, the attending physician, personally viewed and interpreted this ECG.  Normal sinus rhythm, rate of 71, normal intervals, normal axis, no ST elevations or depressions.  ____________________________________________  RADIOLOGY  CXR: negative ____________________________________________   PROCEDURES  Procedure(s) performed: None Procedures Critical Care performed:  None ____________________________________________   INITIAL IMPRESSION / ASSESSMENT AND PLAN / ED COURSE   83 y.o. female with a history of stroke on Plavix, diabetes, hypertension, hyperlipidemia, GERD who presents for evaluation of shortness  of breath for several weeks worse in the last 2 days. Lungs are clear, normal WOB, normal sats, no leg swelling or edema. No personal of fh of PE/DVT. DDX for SOB with clear lungs includes PE, pericardial effusion, ACS, anemia. Patient also seems anxious about dying since loosing her sister and this could also be the reason for her symptoms. Will do labs, CXR, troponin, EKG, d-dimer. Will monitor on telemetry  Clinical Course as of Aug 08 1410  Wed Aug 08, 2017  0002 workup consistent with the  mild renal insufficiency for which patient received IV fluids as she wishes to go home. I will repeat a BMP to ensure this is improving. Age adjusted d-dimer is negative and patient's symptoms have completely resolved therefore I have no suspicion for PE at this time. Remaining of her blood work with no acute findings. Troponin 2 is negative. Plan  to discharge home if creatinine is improving. Care transferred to Dr. Jacqualine Code.  [CV]    Clinical Course User Index [CV] Rudene Re, MD    Pertinent labs & imaging results that were available during my care of the patient were reviewed by me and considered in my medical decision making (see chart for details).    ____________________________________________   FINAL CLINICAL IMPRESSION(S) / ED DIAGNOSES  Final diagnoses:  SOB (shortness of breath)  Dehydration  Dehydration, mild      NEW MEDICATIONS STARTED DURING THIS VISIT:  Discharge Medication List as of 08/08/2017 12:46 AM       Note:  This document was prepared using Dragon voice recognition software and may include unintentional dictation errors.    Alfred Levins, [place], MD 08/08/17 [phone]

## 2017-08-07 NOTE — ED Triage Notes (Addendum)
Patient to ER from [facility] on [address] via [facility] for c/o shortness of breath. Patient reports to EMS and ER staff that her sister-in-law just passed away and she has had trouble breathing last 2 days, that she believes it is d/t all the deaths in her family recently. Patient able to speak in full sentences without difficulty. Patient also reports that she has vomiting qAM "for a long time" due to acid reflux. Denies cough, fever, congestion, or chills.  Vitals via [facility] WNL.

## 2017-08-08 LAB — BASIC METABOLIC PANEL
ANION GAP: 9 (ref 5–15)
BUN: 37 mg/dL — ABNORMAL HIGH (ref 6–20)
CO2: 25 mmol/L (ref 22–32)
Calcium: 8.9 mg/dL (ref 8.9–10.3)
Chloride: 105 mmol/L (ref 101–111)
Creatinine, Ser: 1.82 mg/dL — ABNORMAL HIGH (ref 0.44–1.00)
GFR calc Af Amer: 28 mL/min — ABNORMAL LOW (ref >60)
GFR, EST NON AFRICAN AMERICAN: 25 mL/min — AB (ref >60)
Glucose, Bld: 109 mg/dL — ABNORMAL HIGH (ref 65–99)
POTASSIUM: 3.7 mmol/L (ref 3.5–5.1)
SODIUM: 139 mmol/L (ref 135–145)

## 2017-08-08 NOTE — ED Provider Notes (Signed)
Receive the patient in care from Dr. Alfred Levins. Patient here for evaluation of shortness of breath, been evaluated with troponins, chemistry, chest x-ray and d-dimer. Dr. Clayton Bibles reports to me that using age-adjusted d-dimer she is negative and does not need further workup for pulmonary embolus him.  ----------------------------------------- 12:40 AM on 08/08/2017 -----------------------------------------  Patient reports no ongoing symptoms. She and her brother are present, both present and we reviewed her kidney function tests. Shows marginal improvement since last check. Discussed with her, she is comfortable and requests to be able to go back to her care home and would like to go to follow-up with her primary doctor in the coming week. I advised reevaluation and having a repeat chemistry performed on her kidney function as well.  Return precautions and treatment recommendations and follow-up discussed with the patient who is agreeable with the plan.    Delman Kitten, MD 08/08/17 [phone]

## 2017-12-12 ENCOUNTER — Other Ambulatory Visit: Payer: Self-pay | Admitting: Family Medicine

## 2017-12-12 DIAGNOSIS — R51 Headache: Principal | ICD-10-CM

## 2017-12-12 DIAGNOSIS — R519 Headache, unspecified: Secondary | ICD-10-CM

## 2018-01-07 ENCOUNTER — Other Ambulatory Visit: Payer: Self-pay | Admitting: Nephrology

## 2018-01-07 DIAGNOSIS — N183 Chronic kidney disease, stage 3 unspecified: Secondary | ICD-10-CM

## 2018-01-14 ENCOUNTER — Ambulatory Visit
Admission: RE | Admit: 2018-01-14 | Discharge: 2018-01-14 | Disposition: A | Discharge status: Home / Self Care | Payer: Medicare HMO | Source: Ambulatory Visit | Attending: Nephrology | Admitting: Nephrology

## 2018-01-14 DIAGNOSIS — N183 Chronic kidney disease, stage 3 unspecified: Secondary | ICD-10-CM

## 2018-01-14 DIAGNOSIS — R93421 Abnormal radiologic findings on diagnostic imaging of right kidney: Secondary | ICD-10-CM | POA: Diagnosis not present

## 2018-01-14 DIAGNOSIS — R93422 Abnormal radiologic findings on diagnostic imaging of left kidney: Secondary | ICD-10-CM | POA: Diagnosis not present

## 2018-01-15 ENCOUNTER — Ambulatory Visit
Admission: RE | Admit: 2018-01-15 | Discharge: 2018-01-15 | Disposition: A | Discharge status: Home / Self Care | Payer: Medicare HMO | Source: Ambulatory Visit | Attending: Family Medicine | Admitting: Family Medicine

## 2018-01-15 DIAGNOSIS — R519 Headache, unspecified: Secondary | ICD-10-CM

## 2018-01-15 DIAGNOSIS — R51 Headache: Secondary | ICD-10-CM | POA: Diagnosis present

## 2018-05-27 ENCOUNTER — Encounter: Payer: Self-pay | Admitting: Emergency Medicine

## 2018-05-27 ENCOUNTER — Other Ambulatory Visit: Payer: Self-pay

## 2018-05-27 ENCOUNTER — Emergency Department
Admission: EM | Admit: 2018-05-27 | Discharge: 2018-05-27 | Disposition: A | Discharge status: Home / Self Care | Payer: Medicare HMO | Attending: Emergency Medicine | Admitting: Emergency Medicine

## 2018-05-27 DIAGNOSIS — R1110 Vomiting, unspecified: Secondary | ICD-10-CM | POA: Diagnosis not present

## 2018-05-27 DIAGNOSIS — N764 Abscess of vulva: Secondary | ICD-10-CM | POA: Diagnosis present

## 2018-05-27 DIAGNOSIS — Z8673 Personal history of transient ischemic attack (TIA), and cerebral infarction without residual deficits: Secondary | ICD-10-CM | POA: Diagnosis not present

## 2018-05-27 DIAGNOSIS — C9000 Multiple myeloma not having achieved remission: Secondary | ICD-10-CM | POA: Diagnosis not present

## 2018-05-27 DIAGNOSIS — R110 Nausea: Secondary | ICD-10-CM

## 2018-05-27 DIAGNOSIS — E119 Type 2 diabetes mellitus without complications: Secondary | ICD-10-CM | POA: Insufficient documentation

## 2018-05-27 DIAGNOSIS — Z7984 Long term (current) use of oral hypoglycemic drugs: Secondary | ICD-10-CM | POA: Diagnosis not present

## 2018-05-27 DIAGNOSIS — Z7982 Long term (current) use of aspirin: Secondary | ICD-10-CM | POA: Insufficient documentation

## 2018-05-27 HISTORY — DX: Malignant (primary) neoplasm, unspecified: C80.1

## 2018-05-27 LAB — COMPREHENSIVE METABOLIC PANEL
ALBUMIN: 3.3 g/dL — AB (ref 3.5–5.0)
ALK PHOS: 67 U/L (ref 38–126)
ALT: 9 U/L (ref 0–44)
ANION GAP: 10 (ref 5–15)
AST: 17 U/L (ref 15–41)
BUN: 29 mg/dL — ABNORMAL HIGH (ref 8–23)
CALCIUM: 9.1 mg/dL (ref 8.9–10.3)
CHLORIDE: 105 mmol/L (ref 98–111)
CO2: 24 mmol/L (ref 22–32)
Creatinine, Ser: 1.73 mg/dL — ABNORMAL HIGH (ref 0.44–1.00)
GFR calc non Af Amer: 26 mL/min — ABNORMAL LOW (ref >60)
GFR, EST AFRICAN AMERICAN: 30 mL/min — AB (ref >60)
GLUCOSE: 120 mg/dL — AB (ref 70–99)
POTASSIUM: 3.9 mmol/L (ref 3.5–5.1)
SODIUM: 139 mmol/L (ref 135–145)
Total Bilirubin: 0.5 mg/dL (ref 0.3–1.2)
Total Protein: 7.8 g/dL (ref 6.5–8.1)

## 2018-05-27 LAB — CBC WITH DIFFERENTIAL/PLATELET
BASOS PCT: 1 %
Basophils Absolute: 0.1 10*3/uL (ref 0–0.1)
Eosinophils Absolute: 0.2 10*3/uL (ref 0–0.7)
Eosinophils Relative: 2 %
HEMATOCRIT: 31.2 % — AB (ref 35.0–47.0)
Hemoglobin: 10.7 g/dL — ABNORMAL LOW (ref 12.0–16.0)
LYMPHS ABS: 1 10*3/uL (ref 1.0–3.6)
LYMPHS PCT: 10 %
MCH: 25.5 pg — AB (ref 26.0–34.0)
MCHC: 34.2 g/dL (ref 32.0–36.0)
MCV: 74.6 fL — ABNORMAL LOW (ref 80.0–100.0)
MONO ABS: 0.6 10*3/uL (ref 0.2–0.9)
MONOS PCT: 6 %
NEUTROS ABS: 8.1 10*3/uL — AB (ref 1.4–6.5)
NEUTROS PCT: 81 %
Platelets: 295 10*3/uL (ref 150–440)
RBC: 4.19 MIL/uL (ref 3.80–5.20)
RDW: 17 % — AB (ref 11.5–14.5)
WBC: 9.8 10*3/uL (ref 3.6–11.0)

## 2018-05-27 LAB — LIPASE, BLOOD: Lipase: 29 U/L (ref 11–51)

## 2018-05-27 MED ORDER — ONDANSETRON HCL 4 MG/2ML IJ SOLN
4.0000 mg | Freq: Once | INTRAMUSCULAR | Status: AC
  Administered 2018-05-27: 4 mg via INTRAVENOUS
  Filled 2018-05-27: qty 2

## 2018-05-27 MED ORDER — SODIUM CHLORIDE 0.9 % IV BOLUS
500.0000 mL | Freq: Once | INTRAVENOUS | Status: AC
  Administered 2018-05-27: 500 mL via INTRAVENOUS

## 2018-05-27 MED ORDER — ONDANSETRON HCL 4 MG PO TABS: 4.0000 mg | ORAL_TABLET | Freq: Every day | ORAL | 0 refills | Status: DC | PRN

## 2018-05-27 MED ORDER — CLINDAMYCIN HCL 300 MG PO CAPS: 300.0000 mg | ORAL_CAPSULE | Freq: Three times a day (TID) | ORAL | 0 refills | Status: AC

## 2018-05-27 MED ORDER — LIDOCAINE-EPINEPHRINE 2 %-1:100000 IJ SOLN
10.0000 mL | Freq: Once | INTRAMUSCULAR | Status: AC
  Administered 2018-05-27: 10 mL via INTRADERMAL
  Filled 2018-05-27: qty 10

## 2018-05-27 NOTE — ED Notes (Signed)
Patient drinking ginger ale with no complaints of nausea at this time. States she feels much better than when she arrived.

## 2018-05-27 NOTE — ED Notes (Signed)
This RN present at bedside as witness for MD during drainage and packing of abscess.

## 2018-05-27 NOTE — ED Provider Notes (Addendum)
[facility] Emergency Department Provider Note  ____________________________________________   I have reviewed the triage vital signs and the nursing notes. Where available I have reviewed prior notes and, if possible and indicated, outside hospital notes.    HISTORY  Chief Complaint Vaginal Bleeding    HPI Carrie Craig is a 84 y.o. female  Who presents today complaining of several things.  She has a tenderness and some bloody discharge from around her vagina which is been going on for "a few days".  Patient also recently diagnosed with multiple myeloma, and has been vomiting off and on since Friday.  Decreased p.o. intake and feels somewhat weak.  No fever no chills.  No diarrhea.  Here with power of attorney, at her baseline according them.  No other complaints.   Past Medical History:  Diagnosis Date  . Cancer ([place])    multiple myeloma  . Diabetes mellitus without complication ([place])   . GERD (gastroesophageal reflux disease)   . Stroke [facility])     Patient Active Problem List   Diagnosis Date Noted  . UTI (urinary tract infection) 11/25/2015  . Thoracic compression fracture ([place]) 11/25/2015    History reviewed. No pertinent surgical history.  Prior to Admission medications   Medication Sig Start Date End Date Taking? Authorizing Provider  alendronate (FOSAMAX) 70 MG tablet Take 70 mg by mouth every Monday. Take with a full glass of water on an empty stomach.   Yes [provider]  amLODipine (NORVASC) 5 MG tablet Take 5 mg by mouth daily.   Yes [provider]  aspirin EC 81 MG tablet Take 81 mg by mouth daily.   Yes [provider]  atorvastatin (LIPITOR) 40 MG tablet Take 40 mg by mouth at bedtime.   Yes [provider]  Calcium Carbonate-Vitamin D3 (CALCIUM 600-D) 600-400 MG-UNIT TABS Take 1 tablet by mouth 2 (two) times daily with a meal.   Yes [provider]  clopidogrel (PLAVIX) 75 MG tablet Take  75 mg by mouth daily.   Yes [provider]  glipiZIDE (GLUCOTROL) 5 MG tablet Take by mouth daily before breakfast.   Yes [provider]  metoprolol succinate (TOPROL-XL) 25 MG 24 hr tablet Take 25 mg by mouth daily.   Yes [provider]  omeprazole (PRILOSEC) 20 MG capsule Take 20 mg by mouth every morning.   Yes [provider]  tiZANidine (ZANAFLEX) 2 MG tablet Take 2 mg by mouth 3 (three) times daily as needed for muscle spasms.   Yes [provider]  traMADol (ULTRAM) 50 MG tablet Take 50 mg by mouth 3 (three) times daily as needed for moderate pain.    Yes [provider]  traZODone (DESYREL) 50 MG tablet Take 50 mg by mouth at bedtime as needed for sleep.   Yes [provider]    Allergies Patient has no known allergies.  No family history on file.  Social History Social History   Tobacco Use  . Smoking status: Never Smoker  . Smokeless tobacco: Never Used  Substance Use Topics  . Alcohol use: No  . Drug use: No    Review of Systems Constitutional: No fever/chills Eyes: No visual changes. ENT: No sore throat. No stiff neck no neck pain Cardiovascular: Denies chest pain. Respiratory: Denies shortness of breath. Gastrointestinal:   + vomiting.  No diarrhea.  No constipation. Genitourinary: Negative for dysuria. Musculoskeletal: Negative lower extremity swelling Skin: See HPI Neurological: Negative for severe headaches,  focal weakness or numbness.   ____________________________________________   PHYSICAL EXAM:  VITAL SIGNS: ED Triage Vitals  Enc Vitals Group     BP 05/27/18 1347 (!) 166/61     Pulse Rate 05/27/18 1347 73     Resp 05/27/18 1347 16     Temp 05/27/18 1347 98.6 F (37 C)     Temp Source 05/27/18 1347 Oral     SpO2 05/27/18 1347 99 %     Weight 05/27/18 1345 126 lb (57.2 kg)     Height 05/27/18 1345 _0  (1.448 m)     Head Circumference --      Peak Flow --      Pain Score  05/27/18 1345 0     Pain Loc --      Pain Edu? --      Excl. in [place]? --     Constitutional: Alert and oriented. Well appearing and in no acute distress. Eyes: Conjunctivae are normal Head: Atraumatic HEENT: No congestion/rhinnorhea. Mucous membranes are moist.  Oropharynx non-erythematous Neck:   Nontender with no meningismus, no masses, no stridor Cardiovascular: Normal rate, regular rhythm. Grossly normal heart sounds.  Good peripheral circulation. Respiratory: Normal respiratory effort.  No retractions. Lungs CTAB. Abdominal: Soft and nontender. No distention. No guarding no rebound Back:  There is no focal tenderness or step off.  there is no midline tenderness there are no lesions noted. there is no CVA tenderness Female nurse chaperone, there is a small labial abscess on the right labia approximately size of a quarter with purulent and slightly bloody drainage already established with mild erythema surrounding it Musculoskeletal: No lower extremity tenderness, no upper extremity tenderness. No joint effusions, no DVT signs strong distal pulses no edema Neurologic:  Normal speech and language. No gross focal neurologic deficits are appreciated.  Skin:  Skin is warm, dry and intact. No rash noted. Psychiatric: Mood and affect are normal. Speech and behavior are normal.  ____________________________________________   LABS (all labs ordered are listed, but only abnormal results are displayed)  Labs Reviewed  AEROBIC CULTURE (SUPERFICIAL SPECIMEN)  COMPREHENSIVE METABOLIC PANEL  CBC WITH DIFFERENTIAL/PLATELET  LIPASE, BLOOD    Pertinent labs  results that were available during my care of the patient were reviewed by me and considered in my medical decision making (see chart for details). ____________________________________________  EKG  I personally interpreted any EKGs ordered by me or triage  ____________________________________________  RADIOLOGY  Pertinent labs &  imaging results that were available during my care of the patient were reviewed by me and considered in my medical decision making (see chart for details). If possible, patient and/or family made aware of any abnormal findings.  No results found. ____________________________________________    PROCEDURES  Procedure(s) performed:  I&D of abscess, Verbal consent, risk benefits and alternatives explained, power of attorney gives consent, Timeout performed, female chaperone present, using lidocaine with epi, 2%, I did instill 5 cc of lidocaine with epi with no complications very by numbing the area, I then did an incision using 11 blade scalpel approximately 1 inch over the abscess, there was some purulent discharge and some serosanguineous discharge noted.  Mild amounts.  No complications patient tolerated well.  Half-inch packing, non-iodoform, was placed afterwards and sterile dressing applied.  Procedures  Critical Care performed: None  ____________________________________________   INITIAL IMPRESSION / ASSESSMENT AND PLAN / ED COURSE  Pertinent labs & imaging results that were available during my care of the patient were reviewed by me  and considered in my medical decision making (see chart for details).  Here with 2 different issues, the first is that she has a small labial abscess which I have drained.  It.  It was already draining, and she tolerated the procedure well.  Also she is been vomiting for couple days.  Patient has been diagnosed with multiple myeloma, started recently on tramadol.  Tramadol immediately preceded the vomiting, it could be that this is a medication reaction although she has not had tramadol today and she did throw up today.  This could be secondary to the cancer, any event the family is concerned she has not had much to eat, abdomen is benign, will give her IV fluids check basic blood work and I have sent a culture for her small abscess.  The literature would  suggest that this likely would get better without antibiotics unless significant cellulitis is present which it is not.  ----------------------------------------- 4:38 PM on 05/27/2018 -----------------------------------------  Signs are reassuring blood work at baseline, patient no acute distress we did give her IV fluid, she feels much better, not nauseated, has not vomited since she is been here for several hours, we did discuss getting in and out catheterized urine as I do not think it think it is possible given the abscess, and patient declines.  I think there is always a chance we might accidentally introduce bacteria by this anyway given very localized infection.  Given that she has no dysuria no urinary frequency and no symptoms of infection at this time aside from the clear obvious abscess which does not appear to be systemic with normal white count no fever, I feel patient is safe for discharge family are very much in agreement with this would like to go home.  Return precautions follow-up given and understood.    ____________________________________________   FINAL CLINICAL IMPRESSION(S) / ED DIAGNOSES  Final diagnoses:  None      This chart was dictated using voice recognition software.  Despite best efforts to proofread,  errors can occur which can change meaning.      Schuyler Amor, MD 05/27/18 1510    Schuyler Amor, MD 05/27/18 [phone]

## 2018-05-27 NOTE — ED Triage Notes (Signed)
Arrives from [facility] for ED evaluation for bleeding from a vaginal "abrasion or boil" x 1 day.  Patient recently diagnosed with multiple myeloma.   Patient is AAOx3.  Skin warm and dry. NAD

## 2018-05-27 NOTE — Discharge Instructions (Addendum)
It is okay to take the packing out in 24 to 48 hours if it does not fall out on its own.  If the area becomes increased in swelling, there is fever persistent vomiting, if there is redness he gets worse or other symptoms including feeling generally unwell please return to the emergency department.  The antibiotics for the next 3 days which will help resolve the symptoms, and see your doctor in a day or 2 to make sure things are improving.  They may wish to continue the antibiotics longer.  Drink plenty of fluids and probiotics during antibiotic course.  If you feel worse in any way please return to the emergency department.

## 2018-05-27 NOTE — ED Notes (Signed)
Patient with draining boil noted to left outer labia. Patient reports pain in area x2 days.

## 2018-05-30 LAB — AEROBIC CULTURE  (SUPERFICIAL SPECIMEN)

## 2018-05-30 LAB — AEROBIC CULTURE W GRAM STAIN (SUPERFICIAL SPECIMEN)

## 2018-05-31 ENCOUNTER — Other Ambulatory Visit: Payer: Self-pay | Admitting: Internal Medicine

## 2018-05-31 ENCOUNTER — Ambulatory Visit
Admission: RE | Admit: 2018-05-31 | Discharge: 2018-05-31 | Disposition: A | Discharge status: Home / Self Care | Payer: Medicare HMO | Source: Ambulatory Visit | Attending: Internal Medicine | Admitting: Internal Medicine

## 2018-05-31 ENCOUNTER — Inpatient Hospital Stay: Payer: Medicare HMO | Attending: Internal Medicine | Admitting: Internal Medicine

## 2018-05-31 ENCOUNTER — Inpatient Hospital Stay: Payer: Medicare HMO

## 2018-05-31 ENCOUNTER — Encounter: Payer: Self-pay | Admitting: Internal Medicine

## 2018-05-31 DIAGNOSIS — D631 Anemia in chronic kidney disease: Secondary | ICD-10-CM | POA: Diagnosis not present

## 2018-05-31 DIAGNOSIS — M4850XA Collapsed vertebra, not elsewhere classified, site unspecified, initial encounter for fracture: Secondary | ICD-10-CM | POA: Insufficient documentation

## 2018-05-31 DIAGNOSIS — N183 Chronic kidney disease, stage 3 unspecified: Secondary | ICD-10-CM

## 2018-05-31 DIAGNOSIS — Z9181 History of falling: Secondary | ICD-10-CM | POA: Diagnosis not present

## 2018-05-31 DIAGNOSIS — Z79899 Other long term (current) drug therapy: Secondary | ICD-10-CM | POA: Diagnosis not present

## 2018-05-31 DIAGNOSIS — I129 Hypertensive chronic kidney disease with stage 1 through stage 4 chronic kidney disease, or unspecified chronic kidney disease: Secondary | ICD-10-CM

## 2018-05-31 DIAGNOSIS — M4854XA Collapsed vertebra, not elsewhere classified, thoracic region, initial encounter for fracture: Secondary | ICD-10-CM | POA: Diagnosis not present

## 2018-05-31 LAB — CBC WITH DIFFERENTIAL/PLATELET
Basophils Absolute: 0.1 10*3/uL (ref 0–0.1)
Basophils Relative: 1 %
EOS ABS: 0.2 10*3/uL (ref 0–0.7)
EOS PCT: 3 %
HCT: 32.9 % — ABNORMAL LOW (ref 35.0–47.0)
HEMOGLOBIN: 10.7 g/dL — AB (ref 12.0–16.0)
LYMPHS ABS: 1.4 10*3/uL (ref 1.0–3.6)
Lymphocytes Relative: 18 %
MCH: 24.7 pg — AB (ref 26.0–34.0)
MCHC: 32.5 g/dL (ref 32.0–36.0)
MCV: 76 fL — ABNORMAL LOW (ref 80.0–100.0)
MONOS PCT: 6 %
Monocytes Absolute: 0.5 10*3/uL (ref 0.2–0.9)
Neutro Abs: 5.6 10*3/uL (ref 1.4–6.5)
Neutrophils Relative %: 72 %
PLATELETS: 403 10*3/uL (ref 150–440)
RBC: 4.32 MIL/uL (ref 3.80–5.20)
RDW: 17.1 % — ABNORMAL HIGH (ref 11.5–14.5)
WBC: 7.8 10*3/uL (ref 3.6–11.0)

## 2018-05-31 LAB — VITAMIN B12: VITAMIN B 12: 392 pg/mL (ref 180–914)

## 2018-05-31 LAB — LACTATE DEHYDROGENASE: LDH: 86 U/L — ABNORMAL LOW (ref 98–192)

## 2018-05-31 LAB — COMPREHENSIVE METABOLIC PANEL
ALBUMIN: 3.4 g/dL — AB (ref 3.5–5.0)
ALK PHOS: 67 U/L (ref 38–126)
ALT: 9 U/L (ref 0–44)
ANION GAP: 9 (ref 5–15)
AST: 18 U/L (ref 15–41)
BUN: 25 mg/dL — ABNORMAL HIGH (ref 8–23)
CALCIUM: 9.4 mg/dL (ref 8.9–10.3)
CO2: 26 mmol/L (ref 22–32)
Chloride: 105 mmol/L (ref 98–111)
Creatinine, Ser: 1.77 mg/dL — ABNORMAL HIGH (ref 0.44–1.00)
GFR calc non Af Amer: 25 mL/min — ABNORMAL LOW (ref >60)
GFR, EST AFRICAN AMERICAN: 29 mL/min — AB (ref >60)
GLUCOSE: 218 mg/dL — AB (ref 70–99)
Potassium: 3.9 mmol/L (ref 3.5–5.1)
SODIUM: 140 mmol/L (ref 135–145)
TOTAL PROTEIN: 8.2 g/dL — AB (ref 6.5–8.1)
Total Bilirubin: 0.4 mg/dL (ref 0.3–1.2)

## 2018-05-31 LAB — IRON AND TIBC
IRON: 23 ug/dL — AB (ref 28–170)
SATURATION RATIOS: 6 % — AB (ref 10.4–31.8)
TIBC: 393 ug/dL (ref 250–450)
UIBC: 370 ug/dL

## 2018-05-31 LAB — C-REACTIVE PROTEIN: CRP: 0.9 mg/dL (ref <1.0)

## 2018-05-31 LAB — FERRITIN: Ferritin: 14 ng/mL (ref 11–307)

## 2018-05-31 LAB — FOLATE: FOLATE: 16.9 ng/mL (ref >5.9)

## 2018-05-31 NOTE — Assessment & Plan Note (Addendum)
#  Anemia-hemoglobin around 10 mild microcytosis; suspect anemia of chronic kidney disease/iron deficiency.  Multiple myeloma clinically less likely-M protein-absent kappa lambda light chain ratio normal.  Patient intolerant to p.o. Iron.  #Discussed that I would recommend IV iron Venofer x4; plan to start at next visit.  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  Also discussed that we could use Aranesp to help increase her hemoglobin continues to be low after IV iron supplementation.  # Discussed the possibility of multiple myeloma is low-as M protein kappa lambda light chain unremarkable.  However nonsecretory multiple myeloma is only diagnosed on bone marrow biopsy.  Hold off a bone marrow biopsy at this time.  We will get skeletal survey.  MDS is also in the differential.  #Check CBC CMP LDH erythropoietin; Z30 folic acid.  # labs; follow up in 10 days; IV venofer; MD.   Carrie Craig plan of care was discussed with the patient and family in detail.  All questions were answered to their satisfaction.  Thank you Dr.Linthavong for allowing me to participate in the care of your pleasant patient. Please do not hesitate to contact me with questions or concerns in the interim.

## 2018-05-31 NOTE — Progress Notes (Signed)
[facility] CONSULT NOTE  Patient Care Team: Dion Body, MD as PCP - General (Family Medicine)  CHIEF COMPLAINTS/PURPOSE OF CONSULTATION:  Anemia  # SEP 2018- ANEMIA M/K-l=wnl [PCP];Hb 10   # CKD stage III [DM/HTN]; Hx of falls;    No history exists.     HISTORY OF PRESENTING ILLNESS:  Carrie Craig 84 y.o.  female long-standing history of diabetes/hypertension has been referred to [place] for further evaluation recommendations of worsening anemia.  Patient complains of fatigue/shortness of breath with exertion.  Denies any swelling in the legs.  Denies any nausea vomiting diarrhea.  Denies any blood in stools black or stools.  Has not had colonoscopies recently.  No difficulty swallowing pain with swallowing.  No significant weight loss.  She does have a history of back pain; worse in the last month or so.   Review of Systems  Constitutional: Positive for malaise/fatigue. Negative for chills, diaphoresis, fever and weight loss.  HENT: Negative for nosebleeds and sore throat.   Eyes: Negative for double vision.  Respiratory: Positive for shortness of breath. Negative for cough, hemoptysis, sputum production and wheezing.   Cardiovascular: Negative for chest pain, palpitations, orthopnea and leg swelling.  Gastrointestinal: Negative for abdominal pain, blood in stool, constipation, diarrhea, heartburn, melena, nausea and vomiting.  Genitourinary: Negative for dysuria, frequency and urgency.  Musculoskeletal: Positive for back pain (1 x1 month). Negative for joint pain.  Skin: Negative.  Negative for itching and rash.  Neurological: Negative for dizziness, tingling, focal weakness, weakness and headaches.  Endo/Heme/Allergies: Does not bruise/bleed easily.  Psychiatric/Behavioral: Negative for depression. The patient is not nervous/anxious and does not have insomnia.      MEDICAL HISTORY:  Past Medical History:  Diagnosis Date  . Cancer ([place])    multiple  myeloma  . Diabetes mellitus without complication ([place])   . GERD (gastroesophageal reflux disease)   . Stroke [facility])     SURGICAL HISTORY: History reviewed. No pertinent surgical history.  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 Uses walker.  Social History   Socioeconomic History  . Marital status: Widowed    Spouse name: Not on file  . Number of children: Not on file  . Years of education: Not on file  . Highest education level: Not on file  Occupational History  . Not on file  Social Needs  . Financial resource strain: Not on file  . Food insecurity:    Worry: Not on file    Inability: Not on file  . Transportation needs:    Medical: Not on file    Non-medical: Not on file  Tobacco Use  . Smoking status: Never Smoker  . Smokeless tobacco: Never Used  Substance and Sexual Activity  . Alcohol use: No  . Drug use: No  . Sexual activity: Not on file  Lifestyle  . Physical activity:    Days per week: Not on file    Minutes per session: Not on file  . Stress: Not on file  Relationships  . Social connections:    Talks on phone: Not on file    Gets together: Not on file    Attends religious service: Not on file    Active member of club or organization: Not on file    Attends meetings of clubs or organizations: Not on file    Relationship status: Not on file  . Intimate partner violence:  Fear of current or ex partner: Not on file    Emotionally abused: Not on file    Physically abused: Not on file    Forced sexual activity: Not on file  Other Topics Concern  . Not on file  Social History Narrative  . Not on file    FAMILY HISTORY: mother- Multiple Myeloma History reviewed. No pertinent family history.  ALLERGIES:  has No Known Allergies.  MEDICATIONS:  Current Outpatient Medications  Medication Sig Dispense Refill  . alendronate (FOSAMAX) 70 MG tablet Take 70 mg by mouth  every Monday. Take with a full glass of water on an empty stomach.    Marland Kitchen amLODipine (NORVASC) 5 MG tablet Take 5 mg by mouth daily.    Marland Kitchen aspirin EC 81 MG tablet Take 81 mg by mouth daily.    Marland Kitchen atorvastatin (LIPITOR) 40 MG tablet Take 40 mg by mouth at bedtime.    . Calcium Carbonate-Vitamin D3 (CALCIUM 600-D) 600-400 MG-UNIT TABS Take 1 tablet by mouth 2 (two) times daily with a meal.    . clopidogrel (PLAVIX) 75 MG tablet Take 75 mg by mouth daily.    Marland Kitchen glipiZIDE (GLUCOTROL) 5 MG tablet Take by mouth daily before breakfast.    . metoprolol succinate (TOPROL-XL) 25 MG 24 hr tablet Take 25 mg by mouth daily.    Marland Kitchen omeprazole (PRILOSEC) 20 MG capsule Take 20 mg by mouth every morning.    . ondansetron (ZOFRAN) 4 MG tablet Take 1 tablet (4 mg total) by mouth daily as needed for nausea or vomiting. 10 tablet 0  . tiZANidine (ZANAFLEX) 2 MG tablet Take 2 mg by mouth 3 (three) times daily as needed for muscle spasms.    . traMADol (ULTRAM) 50 MG tablet Take 50 mg by mouth 3 (three) times daily as needed for moderate pain.     . traZODone (DESYREL) 50 MG tablet Take 50 mg by mouth at bedtime as needed for sleep.     No current facility-administered medications for this visit.       Marland Kitchen  PHYSICAL EXAMINATION: ECOG PERFORMANCE STATUS: 1 - Symptomatic but completely ambulatory  Vitals:   05/31/18 1209  BP: (!) 164/72  Pulse: 71  Resp: 16   Filed Weights   05/31/18 1209  Weight: 128 lb (58.1 kg)    GENERAL: [place] Caucasian female patient in a wheelchair; Alert, no distress and comfortable.  Accompanied by family.  EYES: no pallor or icterus OROPHARYNX: no thrush or ulceration; NECK: supple; no lymph nodes felt. LYMPH:  no palpable lymphadenopathy in the axillary or inguinal regions LUNGS: Decreased breath sounds auscultation bilaterally. No wheeze or crackles HEART/CVS: regular rate & rhythm and no murmurs; No lower extremity edema ABDOMEN:abdomen soft, non-tender and normal bowel  sounds. No hepatomegaly or splenomegaly.  Musculoskeletal:no cyanosis of digits and no clubbing; positive for scoliosis. PSYCH: alert & oriented x 3 with fluent speech NEURO: no focal motor/sensory deficits SKIN:  no rashes or significant lesions  LABORATORY DATA:  I have reviewed the data as listed Lab Results  Component Value Date   WBC 7.8 05/31/2018   HGB 10.7 (L) 05/31/2018   HCT 32.9 (L) 05/31/2018   MCV 76.0 (L) 05/31/2018   PLT 403 05/31/2018   Recent Labs    08/07/17 2307 05/27/18 1518 05/31/18 1258  NA 139 139 140  K 3.7 3.9 3.9  CL 105 105 105  CO2 _0 GLUCOSE 109* 120* 218*  BUN 37* 29* 25*  CREATININE  1.82* 1.73* 1.77*  CALCIUM 8.9 9.1 9.4  GFRNONAA 25* 26* 25*  GFRAA 28* 30* 29*  PROT  --  7.8 8.2*  ALBUMIN  --  3.3* 3.4*  AST  --  17 18  ALT  --  9 9  ALKPHOS  --  67 67  BILITOT  --  0.5 0.4    RADIOGRAPHIC STUDIES: I have personally reviewed the radiological images as listed and agreed with the findings in the report. No results found.  ASSESSMENT & PLAN:   Anemia due to stage 3 chronic kidney disease (HCC) # Anemia-hemoglobin around 10 mild microcytosis; suspect anemia of chronic kidney disease/iron deficiency.  Multiple myeloma clinically less likely-M protein-absent kappa lambda light chain ratio normal.  Patient intolerant to p.o. Iron.  #Discussed that I would recommend IV iron Venofer x4; plan to start at next visit.  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  Also discussed that we could use Aranesp to help increase her hemoglobin continues to be low after IV iron supplementation.  # Discussed the possibility of multiple myeloma is low-as M protein kappa lambda light chain unremarkable.  However nonsecretory multiple myeloma is only diagnosed on bone marrow biopsy.  Hold off a bone marrow biopsy at this time.  We will get skeletal survey.  MDS is also in the differential.  #Check CBC CMP  LDH erythropoietin; C17 folic acid.  # labs; follow up in 10 days; IV venofer; MD.   Eugene Garnet plan of care was discussed with the patient and family in detail.  All questions were answered to their satisfaction.  Thank you Dr.Linthavong for allowing me to participate in the care of your pleasant patient. Please do not hesitate to contact me with questions or concerns in the interim.     All questions were answered. The patient knows to call the clinic with any problems, questions or concerns.       Cammie Sickle, MD 05/31/2018 1:41 PM

## 2018-06-01 LAB — HAPTOGLOBIN: Haptoglobin: 327 mg/dL — ABNORMAL HIGH (ref 34–200)

## 2018-06-03 LAB — ERYTHROPOIETIN: Erythropoietin: 21.7 m[IU]/mL — ABNORMAL HIGH (ref 2.6–18.5)

## 2018-06-12 ENCOUNTER — Inpatient Hospital Stay (HOSPITAL_BASED_OUTPATIENT_CLINIC_OR_DEPARTMENT_OTHER): Payer: Medicare HMO | Admitting: Internal Medicine

## 2018-06-12 ENCOUNTER — Inpatient Hospital Stay: Payer: Medicare HMO

## 2018-06-12 VITALS — BP 167/83 | HR 66 | Temp 98.1°F | Resp 16 | Wt 128.0 lb

## 2018-06-12 VITALS — BP 153/80 | HR 65

## 2018-06-12 DIAGNOSIS — Z79899 Other long term (current) drug therapy: Secondary | ICD-10-CM

## 2018-06-12 DIAGNOSIS — I129 Hypertensive chronic kidney disease with stage 1 through stage 4 chronic kidney disease, or unspecified chronic kidney disease: Secondary | ICD-10-CM | POA: Diagnosis not present

## 2018-06-12 DIAGNOSIS — M4850XA Collapsed vertebra, not elsewhere classified, site unspecified, initial encounter for fracture: Secondary | ICD-10-CM

## 2018-06-12 DIAGNOSIS — N183 Chronic kidney disease, stage 3 unspecified: Secondary | ICD-10-CM

## 2018-06-12 DIAGNOSIS — Z9181 History of falling: Secondary | ICD-10-CM

## 2018-06-12 DIAGNOSIS — D631 Anemia in chronic kidney disease: Secondary | ICD-10-CM

## 2018-06-12 MED ORDER — IRON SUCROSE 20 MG/ML IV SOLN
200.0000 mg | Freq: Once | INTRAVENOUS | Status: AC
  Administered 2018-06-12: 200 mg via INTRAVENOUS
  Filled 2018-06-12: qty 10

## 2018-06-12 MED ORDER — SODIUM CHLORIDE 0.9 % IV SOLN
Freq: Once | INTRAVENOUS | Status: AC
  Administered 2018-06-12: 15:00:00 via INTRAVENOUS
  Filled 2018-06-12: qty 1000

## 2018-06-12 NOTE — Assessment & Plan Note (Addendum)
#   Anemia-hemoglobin around 10 mild microcytosis; suspect anemia of chronic kidney disease/iron deficiency.  July 2019 iron saturation 6% ferritin 23.  New.   #-Recommend IV Venofer weekly x4; first infusion today.  Discussed the potential side effects.  # CKD [DM/HTN]-stage III/stage IVAlso discussed that we could use Aranesp to help increase her hemoglobin continues to be low after IV iron supplementation.  #Back pain-chronic stable skeletal survey reviewed with the patient; compression fractures noted.  Patient had prior history of falls.  Hold off any further work-up at this time.  # weekly venofer x 3; venofer today; follow up in 2 months/labs-possible venofer.  # 25 minutes face-to-face with the patient discussing the above plan of care; more than 50% of time spent on prognosis/ natural history; counseling and coordination.

## 2018-06-12 NOTE — Progress Notes (Signed)
[place] CONSULT NOTE  Patient Care Team: Dion Body, MD as PCP - General (Family Medicine)  CHIEF COMPLAINTS/PURPOSE OF CONSULTATION:  Anemia  # SEP 2018- ANEMIA M/K-l=wnl [PCP];Hb 10 - IDA/CKD-III [July 2019-IV venofer]; PO Iron intolerance;  # CKD stage III [DM/HTN]; Hx of falls;    No history exists.     HISTORY OF PRESENTING ILLNESS:  Carrie Craig 84 y.o.  female long-standing history of diabetes/hypertension is here to review the lab work/imaging order for the recent anemia.  Patient continues to have fatigue/shortness of breath with exertion. Chronic back pain.  This is not any worse.  Review of Systems  Constitutional: Positive for malaise/fatigue. Negative for chills, diaphoresis, fever and weight loss.  HENT: Negative for nosebleeds and sore throat.   Eyes: Negative for double vision.  Respiratory: Positive for shortness of breath. Negative for cough, hemoptysis, sputum production and wheezing.   Cardiovascular: Negative for chest pain, palpitations, orthopnea and leg swelling.  Gastrointestinal: Negative for abdominal pain, blood in stool, constipation, diarrhea, heartburn, melena, nausea and vomiting.  Genitourinary: Negative for dysuria, frequency and urgency.  Musculoskeletal: Positive for back pain (1 x1 month). Negative for joint pain.  Skin: Negative.  Negative for itching and rash.  Neurological: Negative for dizziness, tingling, focal weakness, weakness and headaches.  Endo/Heme/Allergies: Does not bruise/bleed easily.  Psychiatric/Behavioral: Negative for depression. The patient is not nervous/anxious and does not have insomnia.      MEDICAL HISTORY:  Past Medical History:  Diagnosis Date  . Cancer ([place])    multiple myeloma  . Diabetes mellitus without complication ([place])   . GERD (gastroesophageal reflux disease)   . Stroke [facility])     SURGICAL HISTORY: No past surgical history on file.  SOCIAL HISTORY: used to liver in [place]  [until 1 year]; lives in [facility]. 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 Uses walker.  Social History   Socioeconomic History  . Marital status: Widowed    Spouse name: Not on file  . Number of children: Not on file  . Years of education: Not on file  . Highest education level: Not on file  Occupational History  . Not on file  Social Needs  . Financial resource strain: Not on file  . Food insecurity:    Worry: Not on file    Inability: Not on file  . Transportation needs:    Medical: Not on file    Non-medical: Not on file  Tobacco Use  . Smoking status: Never Smoker  . Smokeless tobacco: Never Used  Substance and Sexual Activity  . Alcohol use: No  . Drug use: No  . Sexual activity: Not on file  Lifestyle  . Physical activity:    Days per week: Not on file    Minutes per session: Not on file  . Stress: Not on file  Relationships  . Social connections:    Talks on phone: Not on file    Gets together: Not on file    Attends religious service: Not on file    Active member of club or organization: Not on file    Attends meetings of clubs or organizations: Not on file    Relationship status: Not on file  . Intimate partner violence:    Fear of current or ex partner: Not on file    Emotionally abused: Not on file    Physically abused: Not on file    Forced sexual activity: Not on file  Other Topics Concern  .  Not on file  Social History Narrative  . Not on file    FAMILY HISTORY: mother- Multiple Myeloma No family history on file.  ALLERGIES:  has No Known Allergies.  MEDICATIONS:  Current Outpatient Medications  Medication Sig Dispense Refill  . alendronate (FOSAMAX) 70 MG tablet Take 70 mg by mouth every Monday. Take with a full glass of water on an empty stomach.    Marland Kitchen amLODipine (NORVASC) 5 MG tablet Take 5 mg by mouth daily.    Marland Kitchen aspirin EC 81 MG tablet Take 81 mg by mouth daily.    Marland Kitchen atorvastatin (LIPITOR) 40 MG tablet Take 40 mg by mouth  at bedtime.    . Calcium Carbonate-Vitamin D3 (CALCIUM 600-D) 600-400 MG-UNIT TABS Take 1 tablet by mouth 2 (two) times daily with a meal.    . clopidogrel (PLAVIX) 75 MG tablet Take 75 mg by mouth daily.    Marland Kitchen glipiZIDE (GLUCOTROL) 5 MG tablet Take by mouth daily before breakfast.    . metoprolol succinate (TOPROL-XL) 25 MG 24 hr tablet Take 25 mg by mouth daily.    Marland Kitchen omeprazole (PRILOSEC) 20 MG capsule Take 20 mg by mouth every morning.    . ondansetron (ZOFRAN) 4 MG tablet Take 1 tablet (4 mg total) by mouth daily as needed for nausea or vomiting. 10 tablet 0  . tiZANidine (ZANAFLEX) 2 MG tablet Take 2 mg by mouth 3 (three) times daily as needed for muscle spasms.    . traMADol (ULTRAM) 50 MG tablet Take 50 mg by mouth 3 (three) times daily as needed for moderate pain.     . traZODone (DESYREL) 50 MG tablet Take 50 mg by mouth at bedtime as needed for sleep.     No current facility-administered medications for this visit.       Marland Kitchen  PHYSICAL EXAMINATION: ECOG PERFORMANCE STATUS: 1 - Symptomatic but completely ambulatory  Vitals:   06/12/18 1338 06/12/18 1340  BP:  (!) 167/83  Pulse:  66  Resp: 16 16  Temp:  98.1 F (36.7 C)   Filed Weights   06/12/18 1338  Weight: 128 lb (58.1 kg)    GENERAL: [place] Caucasian female patient in a wheelchair; Alert, no distress and comfortable.  Accompanied by family.  EYES: no pallor or icterus OROPHARYNX: no thrush or ulceration; NECK: supple; no lymph nodes felt. LYMPH:  no palpable lymphadenopathy in the axillary or inguinal regions LUNGS: Decreased breath sounds auscultation bilaterally. No wheeze or crackles HEART/CVS: regular rate & rhythm and no murmurs; No lower extremity edema ABDOMEN:abdomen soft, non-tender and normal bowel sounds. No hepatomegaly or splenomegaly.  Musculoskeletal:no cyanosis of digits and no clubbing; positive for scoliosis. PSYCH: alert & oriented x 3 with fluent speech NEURO: no focal motor/sensory  deficits SKIN:  no rashes or significant lesions  LABORATORY DATA:  I have reviewed the data as listed Lab Results  Component Value Date   WBC 7.8 05/31/2018   HGB 10.7 (L) 05/31/2018   HCT 32.9 (L) 05/31/2018   MCV 76.0 (L) 05/31/2018   PLT 403 05/31/2018   Recent Labs    08/07/17 2307 05/27/18 1518 05/31/18 1258  NA 139 139 140  K 3.7 3.9 3.9  CL 105 105 105  CO2 25 24 26   GLUCOSE 109* 120* 218*  BUN 37* 29* 25*  CREATININE 1.82* 1.73* 1.77*  CALCIUM 8.9 9.1 9.4  GFRNONAA 25* 26* 25*  GFRAA 28* 30* 29*  PROT  --  7.8 8.2*  ALBUMIN  --  3.3* 3.4*  AST  --  17 18  ALT  --  9 9  ALKPHOS  --  67 67  BILITOT  --  0.5 0.4    RADIOGRAPHIC STUDIES: I have personally reviewed the radiological images as listed and agreed with the findings in the report. Dg Bone Survey Met  Result Date: 05/31/2018 CLINICAL DATA:  Anemia, multiple myeloma EXAM: METASTATIC BONE SURVEY COMPARISON:  Thoracic MRI 11/23/2015 FINDINGS: No lytic lesions of multiple myeloma identified.  Negative skull. Chronic right rib fractures. Chronic fracture deformity of the pubic symphysis. Numerous spinal fractures including approximately T8, T9, T10, L1. Fractures of T8 and T10 were present on the prior MRI with progression of the fracture T10 since that time. T9 and L1 fractures were not present previously. Age indeterminate fractures. Accentuated dorsal kyphosis. Lumbar dextroscoliosis. Extensive atherosclerotic disease. Degenerative change in the knees bilaterally with chondrocalcinosis. Degenerative change in the left elbow and wrist. IMPRESSION: No lytic lesions of  multiple myeloma. Compression fractures T8, T9, T10, and L1 are present of indeterminate age and etiology. These may be due to osteopenia however multiple myeloma can lead to fracture. Correlate with pain. If the patient has pain in this area, consider follow-up MRI of the thoracic spine. Electronically Signed   By: Franchot Gallo M.D.   On: 05/31/2018  16:20    ASSESSMENT & PLAN:   Anemia due to stage 3 chronic kidney disease ([place]) # Anemia-hemoglobin around 10 mild microcytosis; suspect anemia of chronic kidney disease/iron deficiency.  July 2019 iron saturation 6% ferritin 23.  New.   #-Recommend IV Venofer weekly x4; first infusion today.  Discussed the potential side effects.  # CKD [DM/HTN]-stage III/stage IVAlso discussed that we could use Aranesp to help increase her hemoglobin continues to be low after IV iron supplementation.  #Back pain-chronic stable skeletal survey reviewed with the patient; compression fractures noted.  Patient had prior history of falls.  Hold off any further work-up at this time.  # weekly venofer x 3; venofer today; follow up in 2 months/labs-possible venofer.  # 25 minutes face-to-face with the patient discussing the above plan of care; more than 50% of time spent on prognosis/ natural history; counseling and coordination.      All questions were answered. The patient knows to call the clinic with any problems, questions or concerns.       Cammie Sickle, MD 06/15/2018 9:12 AM

## 2018-06-19 ENCOUNTER — Inpatient Hospital Stay: Payer: Medicare HMO

## 2018-06-19 VITALS — BP 144/77 | HR 61 | Temp 98.1°F | Resp 18

## 2018-06-19 DIAGNOSIS — I129 Hypertensive chronic kidney disease with stage 1 through stage 4 chronic kidney disease, or unspecified chronic kidney disease: Secondary | ICD-10-CM | POA: Diagnosis not present

## 2018-06-19 DIAGNOSIS — D631 Anemia in chronic kidney disease: Secondary | ICD-10-CM

## 2018-06-19 DIAGNOSIS — N183 Chronic kidney disease, stage 3 (moderate): Principal | ICD-10-CM

## 2018-06-19 MED ORDER — IRON SUCROSE 20 MG/ML IV SOLN
200.0000 mg | Freq: Once | INTRAVENOUS | Status: AC
  Administered 2018-06-19: 200 mg via INTRAVENOUS
  Filled 2018-06-19: qty 10

## 2018-06-19 MED ORDER — SODIUM CHLORIDE 0.9 % IV SOLN
Freq: Once | INTRAVENOUS | Status: AC
  Administered 2018-06-19: 14:00:00 via INTRAVENOUS
  Filled 2018-06-19: qty 1000

## 2018-06-26 ENCOUNTER — Inpatient Hospital Stay: Payer: Medicare HMO | Attending: Internal Medicine

## 2018-06-26 VITALS — BP 145/75 | HR 61 | Temp 98.0°F | Resp 18

## 2018-06-26 DIAGNOSIS — D631 Anemia in chronic kidney disease: Secondary | ICD-10-CM | POA: Diagnosis not present

## 2018-06-26 DIAGNOSIS — M4850XA Collapsed vertebra, not elsewhere classified, site unspecified, initial encounter for fracture: Secondary | ICD-10-CM | POA: Diagnosis not present

## 2018-06-26 DIAGNOSIS — N183 Chronic kidney disease, stage 3 (moderate): Secondary | ICD-10-CM | POA: Diagnosis not present

## 2018-06-26 DIAGNOSIS — I129 Hypertensive chronic kidney disease with stage 1 through stage 4 chronic kidney disease, or unspecified chronic kidney disease: Secondary | ICD-10-CM | POA: Diagnosis present

## 2018-06-26 DIAGNOSIS — Z9181 History of falling: Secondary | ICD-10-CM | POA: Insufficient documentation

## 2018-06-26 DIAGNOSIS — Z79899 Other long term (current) drug therapy: Secondary | ICD-10-CM | POA: Diagnosis not present

## 2018-06-26 MED ORDER — IRON SUCROSE 20 MG/ML IV SOLN
200.0000 mg | Freq: Once | INTRAVENOUS | Status: AC
  Administered 2018-06-26: 200 mg via INTRAVENOUS
  Filled 2018-06-26: qty 10

## 2018-06-26 MED ORDER — SODIUM CHLORIDE 0.9 % IV SOLN
Freq: Once | INTRAVENOUS | Status: AC
  Administered 2018-06-26: 14:00:00 via INTRAVENOUS
  Filled 2018-06-26: qty 1000

## 2018-07-03 ENCOUNTER — Inpatient Hospital Stay: Payer: Medicare HMO

## 2018-07-03 VITALS — BP 148/80 | HR 63 | Temp 97.0°F | Resp 20

## 2018-07-03 DIAGNOSIS — I129 Hypertensive chronic kidney disease with stage 1 through stage 4 chronic kidney disease, or unspecified chronic kidney disease: Secondary | ICD-10-CM | POA: Diagnosis not present

## 2018-07-03 DIAGNOSIS — N183 Chronic kidney disease, stage 3 unspecified: Secondary | ICD-10-CM

## 2018-07-03 DIAGNOSIS — D631 Anemia in chronic kidney disease: Secondary | ICD-10-CM

## 2018-07-03 MED ORDER — SODIUM CHLORIDE 0.9 % IV SOLN
Freq: Once | INTRAVENOUS | Status: AC
  Administered 2018-07-03: 14:00:00 via INTRAVENOUS
  Filled 2018-07-03: qty 1000

## 2018-07-03 MED ORDER — IRON SUCROSE 20 MG/ML IV SOLN
200.0000 mg | Freq: Once | INTRAVENOUS | Status: AC
  Administered 2018-07-03: 200 mg via INTRAVENOUS
  Filled 2018-07-03: qty 10

## 2018-08-12 ENCOUNTER — Inpatient Hospital Stay: Payer: Medicare HMO | Attending: Internal Medicine

## 2018-08-12 DIAGNOSIS — I129 Hypertensive chronic kidney disease with stage 1 through stage 4 chronic kidney disease, or unspecified chronic kidney disease: Secondary | ICD-10-CM | POA: Insufficient documentation

## 2018-08-12 DIAGNOSIS — D472 Monoclonal gammopathy: Secondary | ICD-10-CM | POA: Insufficient documentation

## 2018-08-12 DIAGNOSIS — Z7984 Long term (current) use of oral hypoglycemic drugs: Secondary | ICD-10-CM | POA: Diagnosis not present

## 2018-08-12 DIAGNOSIS — D631 Anemia in chronic kidney disease: Secondary | ICD-10-CM | POA: Insufficient documentation

## 2018-08-12 DIAGNOSIS — G8929 Other chronic pain: Secondary | ICD-10-CM | POA: Diagnosis not present

## 2018-08-12 DIAGNOSIS — N183 Chronic kidney disease, stage 3 (moderate): Secondary | ICD-10-CM | POA: Diagnosis present

## 2018-08-12 DIAGNOSIS — E1122 Type 2 diabetes mellitus with diabetic chronic kidney disease: Secondary | ICD-10-CM | POA: Diagnosis not present

## 2018-08-12 DIAGNOSIS — M549 Dorsalgia, unspecified: Secondary | ICD-10-CM | POA: Diagnosis not present

## 2018-08-12 LAB — CBC WITH DIFFERENTIAL/PLATELET
BASOS ABS: 0.1 10*3/uL (ref 0–0.1)
BASOS PCT: 1 %
EOS PCT: 5 %
Eosinophils Absolute: 0.3 10*3/uL (ref 0–0.7)
HCT: 38 % (ref 35.0–47.0)
Hemoglobin: 12.9 g/dL (ref 12.0–16.0)
Lymphocytes Relative: 22 %
Lymphs Abs: 1.5 10*3/uL (ref 1.0–3.6)
MCH: 28.3 pg (ref 26.0–34.0)
MCHC: 33.9 g/dL (ref 32.0–36.0)
MCV: 83.4 fL (ref 80.0–100.0)
MONO ABS: 0.4 10*3/uL (ref 0.2–0.9)
Monocytes Relative: 5 %
NEUTROS ABS: 4.5 10*3/uL (ref 1.4–6.5)
Neutrophils Relative %: 67 %
PLATELETS: 279 10*3/uL (ref 150–440)
RBC: 4.56 MIL/uL (ref 3.80–5.20)
RDW: 19.8 % — AB (ref 11.5–14.5)
WBC: 6.8 10*3/uL (ref 3.6–11.0)

## 2018-08-12 LAB — FERRITIN: FERRITIN: 91 ng/mL (ref 11–307)

## 2018-08-12 LAB — IRON AND TIBC
IRON: 52 ug/dL (ref 28–170)
Saturation Ratios: 19 % (ref 10.4–31.8)
TIBC: 280 ug/dL (ref 250–450)
UIBC: 228 ug/dL

## 2018-08-14 ENCOUNTER — Inpatient Hospital Stay: Payer: Medicare HMO

## 2018-08-14 ENCOUNTER — Encounter: Payer: Self-pay | Admitting: Internal Medicine

## 2018-08-14 ENCOUNTER — Inpatient Hospital Stay (HOSPITAL_BASED_OUTPATIENT_CLINIC_OR_DEPARTMENT_OTHER): Payer: Medicare HMO | Admitting: Internal Medicine

## 2018-08-14 ENCOUNTER — Other Ambulatory Visit: Payer: Medicare HMO

## 2018-08-14 VITALS — BP 167/84 | HR 65 | Temp 97.3°F | Resp 16 | Wt 125.4 lb

## 2018-08-14 DIAGNOSIS — E1122 Type 2 diabetes mellitus with diabetic chronic kidney disease: Secondary | ICD-10-CM | POA: Diagnosis not present

## 2018-08-14 DIAGNOSIS — N183 Chronic kidney disease, stage 3 unspecified: Secondary | ICD-10-CM

## 2018-08-14 DIAGNOSIS — G8929 Other chronic pain: Secondary | ICD-10-CM

## 2018-08-14 DIAGNOSIS — I129 Hypertensive chronic kidney disease with stage 1 through stage 4 chronic kidney disease, or unspecified chronic kidney disease: Secondary | ICD-10-CM

## 2018-08-14 DIAGNOSIS — Z7984 Long term (current) use of oral hypoglycemic drugs: Secondary | ICD-10-CM

## 2018-08-14 DIAGNOSIS — M549 Dorsalgia, unspecified: Secondary | ICD-10-CM

## 2018-08-14 DIAGNOSIS — D631 Anemia in chronic kidney disease: Secondary | ICD-10-CM

## 2018-08-14 DIAGNOSIS — D472 Monoclonal gammopathy: Secondary | ICD-10-CM

## 2018-08-14 NOTE — Assessment & Plan Note (Addendum)
#  Anemia-hemoglobin around 10 mild microcytosis; suspect anemia of chronic kidney disease/iron deficiency.  July 2019 iron saturation 6% ferritin 23.  Status post IV iron infusion hemoglobin improved up to 12.5.  Recommend p.o. iron every other day.  No role for Aranesp at this time.  # CKD [DM/HTN]-stage III/stage IV-stable.  # MGUS- 0.7 gm/dl; K/L= Normal [July 2019]; do not clinically suspect active multiple myeloma.  Recommend follow-up in the next 4 months.  #Back pain-chronic stable skeletal survey reviewed with the patient; compression fractures noted.  Stable.  # follow up in 4 months/labs-possible venofer/myeloma labs.

## 2018-08-14 NOTE — Progress Notes (Signed)
[place] CONSULT NOTE  Patient Care Team: Dion Body, MD as PCP - General (Family Medicine)  CHIEF COMPLAINTS/PURPOSE OF CONSULTATION:  Anemia  # SEP 2018- ANEMIA M-protein= 0.7gm/dl/K-l=wnl [PCP];Hb 10 - IDA/CKD-III [July 2019-IV venofer];   # MGUS[ July 2019- 0.7 gm/dl; Dr.L] K/L=N  # CKD stage III [DM/HTN]; Hx of falls;    No history exists.     HISTORY OF PRESENTING ILLNESS:  Carrie Craig 84 y.o.  female long-standing history of diabetes/hypertension is here t for follow-up.   Patient continues to have ongoing fatigue/shortness of breath with exertion.  Chronic back pain.  No falls.  Patient continues to have fatigue/shortness of breath with exertion. Chronic back pain.  This is not any worse.  Review of Systems  Constitutional: Positive for malaise/fatigue. Negative for chills, diaphoresis, fever and weight loss.  HENT: Negative for nosebleeds and sore throat.   Eyes: Negative for double vision.  Respiratory: Positive for shortness of breath. Negative for cough, hemoptysis, sputum production and wheezing.   Cardiovascular: Negative for chest pain, palpitations, orthopnea and leg swelling.  Gastrointestinal: Negative for abdominal pain, blood in stool, constipation, diarrhea, heartburn, melena, nausea and vomiting.  Genitourinary: Negative for dysuria, frequency and urgency.  Musculoskeletal: Negative for back pain and joint pain.  Skin: Negative.  Negative for itching and rash.  Neurological: Negative for dizziness, tingling, focal weakness, weakness and headaches.  Endo/Heme/Allergies: Does not bruise/bleed easily.  Psychiatric/Behavioral: Negative for depression. The patient has insomnia. The patient is not nervous/anxious.      MEDICAL HISTORY:  Past Medical History:  Diagnosis Date  . Cancer ([place])    multiple myeloma  . Diabetes mellitus without complication ([place])   . GERD (gastroesophageal reflux disease)   . Stroke [facility])      SURGICAL HISTORY: History reviewed. No pertinent surgical history.  SOCIAL HISTORY: used to liver in [place] [until 1 year]; lives in [facility]. 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 Uses walker.  Social History   Socioeconomic History  . Marital status: Widowed    Spouse name: Not on file  . Number of children: Not on file  . Years of education: Not on file  . Highest education level: Not on file  Occupational History  . Not on file  Social Needs  . Financial resource strain: Not on file  . Food insecurity:    Worry: Not on file    Inability: Not on file  . Transportation needs:    Medical: Not on file    Non-medical: Not on file  Tobacco Use  . Smoking status: Never Smoker  . Smokeless tobacco: Never Used  Substance and Sexual Activity  . Alcohol use: No  . Drug use: No  . Sexual activity: Not on file  Lifestyle  . Physical activity:    Days per week: Not on file    Minutes per session: Not on file  . Stress: Not on file  Relationships  . Social connections:    Talks on phone: Not on file    Gets together: Not on file    Attends religious service: Not on file    Active member of club or organization: Not on file    Attends meetings of clubs or organizations: Not on file    Relationship status: Not on file  . Intimate partner violence:    Fear of current or ex partner: Not on file    Emotionally abused: Not on file    Physically abused: Not  on file    Forced sexual activity: Not on file  Other Topics Concern  . Not on file  Social History Narrative  . Not on file    FAMILY HISTORY: mother- Multiple Myeloma History reviewed. No pertinent family history.  ALLERGIES:  has No Known Allergies.  MEDICATIONS:  Current Outpatient Medications  Medication Sig Dispense Refill  . alendronate (FOSAMAX) 70 MG tablet Take 70 mg by mouth every Monday. Take with a full glass of water on an empty stomach.    Marland Kitchen amLODipine (NORVASC) 5 MG tablet Take 5 mg by  mouth daily.    Marland Kitchen aspirin EC 81 MG tablet Take 81 mg by mouth daily.    Marland Kitchen atorvastatin (LIPITOR) 40 MG tablet Take 40 mg by mouth at bedtime.    . Calcium Carbonate-Vitamin D3 (CALCIUM 600-D) 600-400 MG-UNIT TABS Take 1 tablet by mouth 2 (two) times daily with a meal.    . clopidogrel (PLAVIX) 75 MG tablet Take 75 mg by mouth daily.    Marland Kitchen glipiZIDE (GLUCOTROL) 5 MG tablet Take by mouth daily before breakfast.    . metoprolol succinate (TOPROL-XL) 25 MG 24 hr tablet Take 25 mg by mouth daily.    Marland Kitchen omeprazole (PRILOSEC) 20 MG capsule Take 20 mg by mouth every morning.    . ondansetron (ZOFRAN) 4 MG tablet Take 1 tablet (4 mg total) by mouth daily as needed for nausea or vomiting. 10 tablet 0  . tiZANidine (ZANAFLEX) 2 MG tablet Take 2 mg by mouth 3 (three) times daily as needed for muscle spasms.    . traMADol (ULTRAM) 50 MG tablet Take 50 mg by mouth 3 (three) times daily as needed for moderate pain.     . traZODone (DESYREL) 50 MG tablet Take 50 mg by mouth at bedtime as needed for sleep.     No current facility-administered medications for this visit.       Marland Kitchen  PHYSICAL EXAMINATION: ECOG PERFORMANCE STATUS: 1 - Symptomatic but completely ambulatory  Vitals:   08/14/18 1439  BP: (!) 167/84  Pulse: 65  Resp: 16  Temp: (!) 97.3 F (36.3 C)   Filed Weights   08/14/18 1439  Weight: 125 lb 6.4 oz (56.9 kg)    Physical Exam  Constitutional: She is oriented to person, place, and time and well-developed, well-nourished, and in no distress.  With son; in a wheel chair   HENT:  Head: Normocephalic and atraumatic.  Mouth/Throat: Oropharynx is clear and moist. No oropharyngeal exudate.  Eyes: Pupils are equal, round, and reactive to light.  Neck: Normal range of motion. Neck supple.  Cardiovascular: Normal rate and regular rhythm.  Pulmonary/Chest: No respiratory distress. She has no wheezes.  Abdominal: Soft. Bowel sounds are normal. She exhibits no distension and no mass. There is no  tenderness. There is no rebound and no guarding.  Musculoskeletal: Normal range of motion. She exhibits no edema or tenderness.  Neurological: She is alert and oriented to person, place, and time.  Skin: Skin is warm.  Psychiatric: Affect normal.     LABORATORY DATA:  I have reviewed the data as listed Lab Results  Component Value Date   WBC 6.8 08/12/2018   HGB 12.9 08/12/2018   HCT 38.0 08/12/2018   MCV 83.4 08/12/2018   PLT 279 08/12/2018   Recent Labs    05/27/18 1518 05/31/18 1258  NA 139 140  K 3.9 3.9  CL 105 105  CO2 24 26  GLUCOSE 120* 218*  BUN  29* 25*  CREATININE 1.73* 1.77*  CALCIUM 9.1 9.4  GFRNONAA 26* 25*  GFRAA 30* 29*  PROT 7.8 8.2*  ALBUMIN 3.3* 3.4*  AST 17 18  ALT 9 9  ALKPHOS 67 67  BILITOT 0.5 0.4    RADIOGRAPHIC STUDIES: I have personally reviewed the radiological images as listed and agreed with the findings in the report. No results found.  ASSESSMENT & PLAN:   Anemia due to stage 3 chronic kidney disease ([facility]) # Anemia-hemoglobin around 10 mild microcytosis; suspect anemia of chronic kidney disease/iron deficiency.  July 2019 iron saturation 6% ferritin 23.  Status post IV iron infusion hemoglobin improved up to 12.5.  Recommend p.o. iron every other day.  No role for Aranesp at this time.  # CKD [DM/HTN]-stage III/stage IV-stable.  # MGUS- 0.7 gm/dl; K/L= Normal [July 2019]; do not clinically suspect active multiple myeloma.  Recommend follow-up in the next 4 months.  #Back pain-chronic stable skeletal survey reviewed with the patient; compression fractures noted.  Stable.  # follow up in 4 months/labs-possible venofer/myeloma labs.      All questions were answered. The patient knows to call the clinic with any problems, questions or concerns.       Cammie Sickle, MD 08/14/2018 5:13 PM

## 2018-12-11 ENCOUNTER — Inpatient Hospital Stay: Payer: Medicare HMO

## 2018-12-13 ENCOUNTER — Inpatient Hospital Stay: Payer: Medicare HMO

## 2018-12-13 ENCOUNTER — Other Ambulatory Visit: Payer: Self-pay | Admitting: *Deleted

## 2018-12-13 ENCOUNTER — Inpatient Hospital Stay: Payer: Medicare HMO | Admitting: Internal Medicine

## 2018-12-13 DIAGNOSIS — D472 Monoclonal gammopathy: Secondary | ICD-10-CM

## 2018-12-13 DIAGNOSIS — N183 Chronic kidney disease, stage 3 unspecified: Secondary | ICD-10-CM

## 2018-12-13 DIAGNOSIS — D631 Anemia in chronic kidney disease: Secondary | ICD-10-CM

## 2018-12-27 ENCOUNTER — Other Ambulatory Visit: Payer: Self-pay

## 2018-12-27 ENCOUNTER — Inpatient Hospital Stay: Payer: Medicare HMO | Attending: Internal Medicine | Admitting: *Deleted

## 2018-12-27 DIAGNOSIS — E1122 Type 2 diabetes mellitus with diabetic chronic kidney disease: Secondary | ICD-10-CM | POA: Diagnosis not present

## 2018-12-27 DIAGNOSIS — Z7984 Long term (current) use of oral hypoglycemic drugs: Secondary | ICD-10-CM | POA: Diagnosis not present

## 2018-12-27 DIAGNOSIS — I129 Hypertensive chronic kidney disease with stage 1 through stage 4 chronic kidney disease, or unspecified chronic kidney disease: Secondary | ICD-10-CM | POA: Insufficient documentation

## 2018-12-27 DIAGNOSIS — K219 Gastro-esophageal reflux disease without esophagitis: Secondary | ICD-10-CM | POA: Insufficient documentation

## 2018-12-27 DIAGNOSIS — D631 Anemia in chronic kidney disease: Secondary | ICD-10-CM

## 2018-12-27 DIAGNOSIS — D472 Monoclonal gammopathy: Secondary | ICD-10-CM

## 2018-12-27 DIAGNOSIS — Z8673 Personal history of transient ischemic attack (TIA), and cerebral infarction without residual deficits: Secondary | ICD-10-CM | POA: Insufficient documentation

## 2018-12-27 DIAGNOSIS — N183 Chronic kidney disease, stage 3 unspecified: Secondary | ICD-10-CM

## 2018-12-27 DIAGNOSIS — Z79899 Other long term (current) drug therapy: Secondary | ICD-10-CM | POA: Insufficient documentation

## 2018-12-27 DIAGNOSIS — R0609 Other forms of dyspnea: Secondary | ICD-10-CM | POA: Insufficient documentation

## 2018-12-27 DIAGNOSIS — Z7982 Long term (current) use of aspirin: Secondary | ICD-10-CM | POA: Insufficient documentation

## 2018-12-27 LAB — IRON AND TIBC
Iron: 39 ug/dL (ref 28–170)
Saturation Ratios: 14 % (ref 10.4–31.8)
TIBC: 286 ug/dL (ref 250–450)
UIBC: 247 ug/dL

## 2018-12-27 LAB — CBC WITH DIFFERENTIAL/PLATELET
Abs Immature Granulocytes: 0.02 10*3/uL (ref 0.00–0.07)
Basophils Absolute: 0 10*3/uL (ref 0.0–0.1)
Basophils Relative: 1 %
Eosinophils Absolute: 0.2 10*3/uL (ref 0.0–0.5)
Eosinophils Relative: 3 %
HCT: 36.4 % (ref 36.0–46.0)
Hemoglobin: 12 g/dL (ref 12.0–15.0)
Immature Granulocytes: 0 %
Lymphocytes Relative: 16 %
Lymphs Abs: 1 10*3/uL (ref 0.7–4.0)
MCH: 29.3 pg (ref 26.0–34.0)
MCHC: 33 g/dL (ref 30.0–36.0)
MCV: 88.8 fL (ref 80.0–100.0)
Monocytes Absolute: 0.4 10*3/uL (ref 0.1–1.0)
Monocytes Relative: 6 %
Neutro Abs: 4.7 10*3/uL (ref 1.7–7.7)
Neutrophils Relative %: 74 %
Platelets: 265 10*3/uL (ref 150–400)
RBC: 4.1 MIL/uL (ref 3.87–5.11)
RDW: 13.5 % (ref 11.5–15.5)
WBC: 6.3 10*3/uL (ref 4.0–10.5)
nRBC: 0 % (ref 0.0–0.2)

## 2018-12-27 LAB — COMPREHENSIVE METABOLIC PANEL
ALK PHOS: 77 U/L (ref 38–126)
ALT: 11 U/L (ref 0–44)
AST: 13 U/L — ABNORMAL LOW (ref 15–41)
Albumin: 3.4 g/dL — ABNORMAL LOW (ref 3.5–5.0)
Anion gap: 6 (ref 5–15)
BUN: 31 mg/dL — ABNORMAL HIGH (ref 8–23)
CALCIUM: 9 mg/dL (ref 8.9–10.3)
CO2: 31 mmol/L (ref 22–32)
Chloride: 103 mmol/L (ref 98–111)
Creatinine, Ser: 2.48 mg/dL — ABNORMAL HIGH (ref 0.44–1.00)
GFR calc Af Amer: 20 mL/min — ABNORMAL LOW (ref >60)
GFR calc non Af Amer: 17 mL/min — ABNORMAL LOW (ref >60)
Glucose, Bld: 183 mg/dL — ABNORMAL HIGH (ref 70–99)
Potassium: 3.9 mmol/L (ref 3.5–5.1)
Sodium: 140 mmol/L (ref 135–145)
Total Bilirubin: 0.5 mg/dL (ref 0.3–1.2)
Total Protein: 7.6 g/dL (ref 6.5–8.1)

## 2018-12-27 LAB — FERRITIN: Ferritin: 111 ng/mL (ref 11–307)

## 2018-12-30 LAB — KAPPA/LAMBDA LIGHT CHAINS
Kappa free light chain: 76.7 mg/L — ABNORMAL HIGH (ref 3.3–19.4)
Kappa, lambda light chain ratio: 1.05 (ref 0.26–1.65)
Lambda free light chains: 73 mg/L — ABNORMAL HIGH (ref 5.7–26.3)

## 2018-12-31 ENCOUNTER — Inpatient Hospital Stay (HOSPITAL_BASED_OUTPATIENT_CLINIC_OR_DEPARTMENT_OTHER): Payer: Medicare HMO | Admitting: Internal Medicine

## 2018-12-31 ENCOUNTER — Encounter: Payer: Self-pay | Admitting: Internal Medicine

## 2018-12-31 ENCOUNTER — Ambulatory Visit
Admission: RE | Admit: 2018-12-31 | Discharge: 2018-12-31 | Disposition: A | Discharge status: Home / Self Care | Payer: Medicare HMO | Source: Ambulatory Visit | Attending: Internal Medicine | Admitting: Internal Medicine

## 2018-12-31 ENCOUNTER — Inpatient Hospital Stay: Payer: Medicare HMO

## 2018-12-31 ENCOUNTER — Other Ambulatory Visit: Payer: Self-pay

## 2018-12-31 ENCOUNTER — Ambulatory Visit
Admission: RE | Admit: 2018-12-31 | Discharge: 2018-12-31 | Disposition: A | Discharge status: Home / Self Care | Payer: Medicare HMO | Attending: Internal Medicine | Admitting: Internal Medicine

## 2018-12-31 VITALS — BP 179/79 | HR 69 | Temp 97.6°F | Resp 20 | Wt 135.0 lb

## 2018-12-31 DIAGNOSIS — R05 Cough: Secondary | ICD-10-CM | POA: Diagnosis present

## 2018-12-31 DIAGNOSIS — D472 Monoclonal gammopathy: Secondary | ICD-10-CM

## 2018-12-31 DIAGNOSIS — N183 Chronic kidney disease, stage 3 unspecified: Secondary | ICD-10-CM

## 2018-12-31 DIAGNOSIS — N184 Chronic kidney disease, stage 4 (severe): Secondary | ICD-10-CM | POA: Diagnosis present

## 2018-12-31 DIAGNOSIS — N179 Acute kidney failure, unspecified: Secondary | ICD-10-CM | POA: Diagnosis not present

## 2018-12-31 DIAGNOSIS — D631 Anemia in chronic kidney disease: Secondary | ICD-10-CM

## 2018-12-31 DIAGNOSIS — I129 Hypertensive chronic kidney disease with stage 1 through stage 4 chronic kidney disease, or unspecified chronic kidney disease: Secondary | ICD-10-CM | POA: Diagnosis not present

## 2018-12-31 DIAGNOSIS — R059 Cough, unspecified: Secondary | ICD-10-CM

## 2018-12-31 DIAGNOSIS — R0609 Other forms of dyspnea: Secondary | ICD-10-CM

## 2018-12-31 DIAGNOSIS — Z79899 Other long term (current) drug therapy: Secondary | ICD-10-CM

## 2018-12-31 LAB — MULTIPLE MYELOMA PANEL, SERUM
Albumin SerPl Elph-Mcnc: 3 g/dL (ref 2.9–4.4)
Albumin/Glob SerPl: 0.8 (ref 0.7–1.7)
Alpha 1: 0.3 g/dL (ref 0.0–0.4)
Alpha2 Glob SerPl Elph-Mcnc: 1 g/dL (ref 0.4–1.0)
B-Globulin SerPl Elph-Mcnc: 1.1 g/dL (ref 0.7–1.3)
Gamma Glob SerPl Elph-Mcnc: 1.5 g/dL (ref 0.4–1.8)
Globulin, Total: 3.9 g/dL (ref 2.2–3.9)
IgA: 138 mg/dL (ref 64–422)
IgG (Immunoglobin G), Serum: 1114 mg/dL (ref 700–1600)
IgM (Immunoglobulin M), Srm: 798 mg/dL — ABNORMAL HIGH (ref 26–217)
M Protein SerPl Elph-Mcnc: 0.5 g/dL — ABNORMAL HIGH
TOTAL PROTEIN ELP: 6.9 g/dL (ref 6.0–8.5)

## 2018-12-31 NOTE — Assessment & Plan Note (Addendum)
#  Anemia/sec to CKD  Status post IV iron infusion hemoglobin improved up to 12.5.  Recommend p.o. iron every other day.  No role for Aranesp at this time.  # CKD [DM/HTN]-stage IV-worsened Creat 2.48/ BL- 1.8; ? Diuretics; get US kidney ASAP- defer to Dr.L.   #Dyspnea on exertion-check chest x-ray today.  Pulse ox 98% on room air.  I suspect debility/worsened renal function fatigue.  # MGUS- 0.7 gm/dl; K/L= Normal [feb 2020; do not clinically suspect active multiple myeloma.  Await M protein number from today.  #Back pain-chronic stable skeletal survey reviewed with the patient; compression fractures noted.  Stable  # DISPOSITION:  # No venofer today.  # CXR todday # Kidney US ASAP # follow up in 6 months-MDlabs-cbc/bmp/-possible venofer-Dr.B  Cc; Dr.L

## 2018-12-31 NOTE — Progress Notes (Signed)
[place] CONSULT NOTE  Patient Care Team: Dion Body, MD as PCP - General (Family Medicine)  CHIEF COMPLAINTS/PURPOSE OF CONSULTATION:  Anemia  # SEP 2018- ANEMIA M-protein= 0.7gm/dl/K-l=wnl [PCP];Hb 10 - IDA/CKD-III [July 2019-IV venofer];   # MGUS[ July 2019- 0.7 gm/dl; Dr.L] K/L=N  # CKD stage III [DM/HTN]; Hx of falls;    No history exists.     HISTORY OF PRESENTING ILLNESS:  Carrie Craig 85 y.o.  female long-standing history of diabetes/hypertension-anemia of chronic kidney disease here for follow-up.  Patient was recently evaluated at [facility] ER for worsening shortness of breath.  Chest x-ray showed possible atelectasis.   Patient continues to be short of breath especially exertion.  Cough on exertion.  No chest pain.  Chronic back pain chronic fatigue.  Intermittent swelling in the legs.  No nausea no vomiting no headaches.  Review of Systems  Constitutional: Positive for malaise/fatigue. Negative for chills, diaphoresis, fever and weight loss.  HENT: Negative for nosebleeds and sore throat.   Eyes: Negative for double vision.  Respiratory: Positive for shortness of breath. Negative for cough, hemoptysis, sputum production and wheezing.   Cardiovascular: Negative for chest pain, palpitations, orthopnea and leg swelling.  Gastrointestinal: Negative for abdominal pain, blood in stool, constipation, diarrhea, heartburn, melena, nausea and vomiting.  Genitourinary: Negative for dysuria, frequency and urgency.  Musculoskeletal: Negative for back pain and joint pain.  Skin: Negative.  Negative for itching and rash.  Neurological: Negative for dizziness, tingling, focal weakness, weakness and headaches.  Endo/Heme/Allergies: Does not bruise/bleed easily.  Psychiatric/Behavioral: Negative for depression. The patient has insomnia. The patient is not nervous/anxious.      MEDICAL HISTORY:  Past Medical History:  Diagnosis Date  . Cancer ([place])     multiple myeloma  . Diabetes mellitus without complication ([place])   . GERD (gastroesophageal reflux disease)   . Stroke [facility])     SURGICAL HISTORY: History reviewed. No pertinent surgical history.  SOCIAL HISTORY: used to liver in [place] [until 1 year]; lives in [place] family care. 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 Uses walker.  Social History   Socioeconomic History  . Marital status: Widowed    Spouse name: Not on file  . Number of children: Not on file  . Years of education: Not on file  . Highest education level: Not on file  Occupational History  . Not on file  Social Needs  . Financial resource strain: Not on file  . Food insecurity:    Worry: Not on file    Inability: Not on file  . Transportation needs:    Medical: Not on file    Non-medical: Not on file  Tobacco Use  . Smoking status: Never Smoker  . Smokeless tobacco: Never Used  Substance and Sexual Activity  . Alcohol use: No  . Drug use: No  . Sexual activity: Not on file  Lifestyle  . Physical activity:    Days per week: Not on file    Minutes per session: Not on file  . Stress: Not on file  Relationships  . Social connections:    Talks on phone: Not on file    Gets together: Not on file    Attends religious service: Not on file    Active member of club or organization: Not on file    Attends meetings of clubs or organizations: Not on file    Relationship status: Not on file  . Intimate partner violence:    Fear of  current or ex partner: Not on file    Emotionally abused: Not on file    Physically abused: Not on file    Forced sexual activity: Not on file  Other Topics Concern  . Not on file  Social History Narrative  . Not on file    FAMILY HISTORY: mother- Multiple Myeloma History reviewed. No pertinent family history.  ALLERGIES:  has No Known Allergies.  MEDICATIONS:  Current Outpatient Medications  Medication Sig Dispense Refill  . alendronate (FOSAMAX) 70 MG tablet Take 70 mg  by mouth every Monday. Take with a full glass of water on an empty stomach.    Marland Kitchen amLODipine (NORVASC) 5 MG tablet Take 5 mg by mouth daily.    Marland Kitchen aspirin EC 81 MG tablet Take 81 mg by mouth daily.    Marland Kitchen atorvastatin (LIPITOR) 40 MG tablet Take 40 mg by mouth at bedtime.    . Calcium Carbonate-Vitamin D3 (CALCIUM 600-D) 600-400 MG-UNIT TABS Take 1 tablet by mouth 2 (two) times daily with a meal.    . clopidogrel (PLAVIX) 75 MG tablet Take 75 mg by mouth daily.    Marland Kitchen glipiZIDE (GLUCOTROL) 5 MG tablet Take by mouth daily before breakfast.    . metoprolol succinate (TOPROL-XL) 25 MG 24 hr tablet Take 25 mg by mouth daily.    Marland Kitchen omeprazole (PRILOSEC) 20 MG capsule Take 20 mg by mouth every morning.    . ondansetron (ZOFRAN) 4 MG tablet Take 1 tablet (4 mg total) by mouth daily as needed for nausea or vomiting. 10 tablet 0  . tiZANidine (ZANAFLEX) 2 MG tablet Take 2 mg by mouth 3 (three) times daily as needed for muscle spasms.    . traMADol (ULTRAM) 50 MG tablet Take 50 mg by mouth 3 (three) times daily as needed for moderate pain.     . traZODone (DESYREL) 50 MG tablet Take 50 mg by mouth at bedtime as needed for sleep.     No current facility-administered medications for this visit.       Marland Kitchen  PHYSICAL EXAMINATION: ECOG PERFORMANCE STATUS: 1 - Symptomatic but completely ambulatory  Vitals:   12/31/18 1315 12/31/18 1321  BP:  (!) 179/79  Pulse:  69  Resp: 20 20  Temp:  97.6 F (36.4 C)  SpO2:  98%   Filed Weights   12/31/18 1319  Weight: 135 lb (61.2 kg)    Physical Exam  Constitutional: She is oriented to person, place, and time.  With son; in a wheel chair.  Frail-appearing.  She is on room air.  HENT:  Head: Normocephalic and atraumatic.  Mouth/Throat: Oropharynx is clear and moist. No oropharyngeal exudate.  Eyes: Pupils are equal, round, and reactive to light.  Neck: Normal range of motion. Neck supple.  Cardiovascular: Normal rate and regular rhythm.  Pulmonary/Chest: No  respiratory distress. She has no wheezes.  Decreased air entry bilaterally.  Abdominal: Soft. Bowel sounds are normal. She exhibits no distension and no mass. There is no abdominal tenderness. There is no rebound and no guarding.  Musculoskeletal: Normal range of motion.        General: No tenderness or edema.  Neurological: She is alert and oriented to person, place, and time.  Skin: Skin is warm.  Psychiatric: Affect normal.     LABORATORY DATA:  I have reviewed the data as listed Lab Results  Component Value Date   WBC 6.3 12/27/2018   HGB 12.0 12/27/2018   HCT 36.4 12/27/2018   MCV  88.8 12/27/2018   PLT 265 12/27/2018   Recent Labs    05/27/18 1518 05/31/18 1258 12/27/18 1433  NA 139 140 140  K 3.9 3.9 3.9  CL 105 105 103  CO2 24 26 31   GLUCOSE 120* 218* 183*  BUN 29* 25* 31*  CREATININE 1.73* 1.77* 2.48*  CALCIUM 9.1 9.4 9.0  GFRNONAA 26* 25* 17*  GFRAA 30* 29* 20*  PROT 7.8 8.2* 7.6  ALBUMIN 3.3* 3.4* 3.4*  AST 17 18 13*  ALT 9 9 11   ALKPHOS 67 67 77  BILITOT 0.5 0.4 0.5    RADIOGRAPHIC STUDIES: I have personally reviewed the radiological images as listed and agreed with the findings in the report. No results found.  ASSESSMENT & PLAN:   Anemia due to stage 3 chronic kidney disease ([place]) # Anemia/sec to CKD  Status post IV iron infusion hemoglobin improved up to 12.5.  Recommend p.o. iron every other day.  No role for Aranesp at this time.  # CKD [DM/HTN]-stage IV-worsened Creat 2.48/ BL- 1.8; ? Diuretics; get US kidney ASAP- defer to Dr.L.   #Dyspnea on exertion-check chest x-ray today.  Pulse ox 98% on room air.  I suspect debility/worsened renal function fatigue.  # MGUS- 0.7 gm/dl; K/L= Normal [feb 2020; do not clinically suspect active multiple myeloma.  Await M protein number from today.  #Back pain-chronic stable skeletal survey reviewed with the patient; compression fractures noted.  Stable  # DISPOSITION:  # No venofer today.  # CXR  todday # Kidney US ASAP # follow up in 6 months-MDlabs-cbc/bmp/-possible venofer-Dr.B  Cc; Dr.L   All questions were answered. The patient knows to call the clinic with any problems, questions or concerns.    Cammie Sickle, MD 12/31/2018 1:51 PM

## 2019-01-03 ENCOUNTER — Ambulatory Visit
Admission: RE | Admit: 2019-01-03 | Discharge: 2019-01-03 | Disposition: A | Discharge status: Home / Self Care | Payer: Medicare HMO | Source: Ambulatory Visit | Attending: Internal Medicine | Admitting: Internal Medicine

## 2019-01-03 DIAGNOSIS — D631 Anemia in chronic kidney disease: Secondary | ICD-10-CM

## 2019-01-03 DIAGNOSIS — N179 Acute kidney failure, unspecified: Secondary | ICD-10-CM | POA: Diagnosis present

## 2019-01-03 DIAGNOSIS — N184 Chronic kidney disease, stage 4 (severe): Secondary | ICD-10-CM | POA: Diagnosis present

## 2019-01-03 DIAGNOSIS — N183 Chronic kidney disease, stage 3 unspecified: Secondary | ICD-10-CM

## 2019-01-09 ENCOUNTER — Other Ambulatory Visit: Payer: Self-pay | Admitting: *Deleted

## 2019-01-09 ENCOUNTER — Telehealth: Payer: Self-pay | Admitting: *Deleted

## 2019-01-09 NOTE — Telephone Encounter (Signed)
Pt's poa returned my phone call. Results provided of test. Pt already has apts with pcp in march

## 2019-01-09 NOTE — Telephone Encounter (Signed)
-----   Message from Cammie Sickle, MD sent at 01/07/2019  4:09 PM EST ----- Please inform patient/son-that her kidney ultrasound does not show any obstruction; changes suggestive of longstanding hypertension.  Please ask to follow-up with PCP regarding the kidney failure.  Also please fax a copy of recent lab CBC CMP/ultrasound report to Dr. Raylene Miyamoto office- Thanks

## 2019-01-09 NOTE — Telephone Encounter (Signed)
1008 am - spoke with Carrie Craig's wife. asked wife to give a msg that I was calling from a doctor's office about Carrie Craig's test results and ask that a msg be given to Carrie Craig to return my phone call. Wife took my phone number states that she will give him the msg.  Labs and test results routed to Dr. Carlean Jews at [facility].

## 2019-06-30 ENCOUNTER — Other Ambulatory Visit: Payer: Self-pay

## 2019-07-01 ENCOUNTER — Inpatient Hospital Stay: Payer: Medicare HMO

## 2019-07-01 ENCOUNTER — Inpatient Hospital Stay: Payer: Medicare HMO | Admitting: Internal Medicine

## 2019-07-31 ENCOUNTER — Other Ambulatory Visit: Payer: Self-pay | Admitting: *Deleted

## 2019-07-31 DIAGNOSIS — D631 Anemia in chronic kidney disease: Secondary | ICD-10-CM

## 2019-07-31 DIAGNOSIS — N183 Chronic kidney disease, stage 3 unspecified: Secondary | ICD-10-CM

## 2019-08-01 ENCOUNTER — Inpatient Hospital Stay: Payer: Medicare HMO

## 2019-08-01 ENCOUNTER — Other Ambulatory Visit: Payer: Self-pay

## 2019-08-01 ENCOUNTER — Inpatient Hospital Stay: Payer: Medicare HMO | Attending: Internal Medicine | Admitting: Internal Medicine

## 2019-08-01 DIAGNOSIS — N183 Chronic kidney disease, stage 3 (moderate): Secondary | ICD-10-CM | POA: Insufficient documentation

## 2019-08-01 DIAGNOSIS — Z79899 Other long term (current) drug therapy: Secondary | ICD-10-CM | POA: Diagnosis not present

## 2019-08-01 DIAGNOSIS — Z8673 Personal history of transient ischemic attack (TIA), and cerebral infarction without residual deficits: Secondary | ICD-10-CM | POA: Insufficient documentation

## 2019-08-01 DIAGNOSIS — E1122 Type 2 diabetes mellitus with diabetic chronic kidney disease: Secondary | ICD-10-CM | POA: Diagnosis not present

## 2019-08-01 DIAGNOSIS — D631 Anemia in chronic kidney disease: Secondary | ICD-10-CM

## 2019-08-01 DIAGNOSIS — I129 Hypertensive chronic kidney disease with stage 1 through stage 4 chronic kidney disease, or unspecified chronic kidney disease: Secondary | ICD-10-CM | POA: Diagnosis not present

## 2019-08-01 DIAGNOSIS — Z7984 Long term (current) use of oral hypoglycemic drugs: Secondary | ICD-10-CM | POA: Insufficient documentation

## 2019-08-01 NOTE — Assessment & Plan Note (Addendum)
#  Anemia/sec to CKD  Status post IV iron infusion hemoglobin improved up to 12.5.  Recommend p.o. iron every other day.  Patient symptoms are at baseline no worsening fatigue or shortness of breath.  # CKD [DM/HTN]-stage IV-clinically stable; continue follow-up with nephrology.  # MGUS-IgM lambda 0.5 gm/dl; K/L= Normal [feb 2020; do not clinically suspect active multiple myeloma.    #Back pain-chronic compression fractures stable  #Discussed regarding blood work regarding evaluation of hemoglobin/renal function.  Patient declined stating that she is awaiting to have blood work with nephrology and PCP soon.  And if significantly abnormal she will let us know.  # I spoke at length with the patient's family/brother- regarding the patient's clinical status/plan of care.  Family agreement.    # DISPOSITION:  # No venofer today.  # follow up in 6 months-MDlabs-cbc/bmp/iron studies/ferritin-possible venofer-Dr.B  Cc; Dr.L /Dr.Singh

## 2019-08-01 NOTE — Progress Notes (Signed)
Patient here today for follow up.  Patient c/o SOB and headache today

## 2019-08-01 NOTE — Progress Notes (Signed)
[place] CONSULT NOTE  Patient Care Team: Dion Body, MD as PCP - General (Family Medicine)  CHIEF COMPLAINTS/PURPOSE OF CONSULTATION:  Anemia  # SEP 2018- ANEMIA ;Hb 10 - IDA/CKD-III [July 2019-IV venofer];   # MGUS IgM [place]- [ July 2019- 0.7 gm/dl; Dr.L] K/L=N;   # CKD stage III [DM/HTN]; Hx of falls;   Oncology History   No history exists.     HISTORY OF PRESENTING ILLNESS:  Carrie Craig 85 y.o.  female long-standing history of diabetes/hypertension-anemia of chronic kidney disease here for follow-up.  Patient is at her baseline health.  Denies any unusual nausea vomiting or weight loss.  Patient continues her chronic shortness of breath especially exertion.  Chronic back pain.  Chronic fatigue.  Notes worsening swelling in the legs.   Review of Systems  Constitutional: Positive for malaise/fatigue. Negative for chills, diaphoresis, fever and weight loss.  HENT: Negative for nosebleeds and sore throat.   Eyes: Negative for double vision.  Respiratory: Positive for shortness of breath. Negative for cough, hemoptysis, sputum production and wheezing.   Cardiovascular: Negative for chest pain, palpitations, orthopnea and leg swelling.  Gastrointestinal: Negative for abdominal pain, blood in stool, constipation, diarrhea, heartburn, melena, nausea and vomiting.  Genitourinary: Negative for dysuria, frequency and urgency.  Musculoskeletal: Negative for back pain and joint pain.  Skin: Negative.  Negative for itching and rash.  Neurological: Negative for dizziness, tingling, focal weakness, weakness and headaches.  Endo/Heme/Allergies: Does not bruise/bleed easily.  Psychiatric/Behavioral: Negative for depression. The patient has insomnia. The patient is not nervous/anxious.      MEDICAL HISTORY:  Past Medical History:  Diagnosis Date  . Cancer ([place])    multiple myeloma  . Diabetes mellitus without complication ([place])   . GERD (gastroesophageal  reflux disease)   . Stroke [facility])     SURGICAL HISTORY: No past surgical history on file.  SOCIAL HISTORY:  Social History   Socioeconomic History  . Marital status: Widowed    Spouse name: Not on file  . Number of children: Not on file  . Years of education: Not on file  . Highest education level: Not on file  Occupational History  . Not on file  Social Needs  . Financial resource strain: Not on file  . Food insecurity    Worry: Not on file    Inability: Not on file  . Transportation needs    Medical: Not on file    Non-medical: Not on file  Tobacco Use  . Smoking status: Never Smoker  . Smokeless tobacco: Never Used  Substance and Sexual Activity  . Alcohol use: No  . Drug use: No  . Sexual activity: Not on file  Lifestyle  . Physical activity    Days per week: Not on file    Minutes per session: Not on file  . Stress: Not on file  Relationships  . Social Herbalist on phone: Not on file    Gets together: Not on file    Attends religious service: Not on file    Active member of club or organization: Not on file    Attends meetings of clubs or organizations: Not on file    Relationship status: Not on file  . Intimate partner violence    Fear of current or ex partner: Not on file    Emotionally abused: Not on file    Physically abused: Not on file    Forced sexual activity: Not on file  Other Topics Concern  . Not on file  Social History Narrative   used to live in [place] [until 1 year]; lives in [place] [facility]. 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 Uses walker.     FAMILY HISTORY: mother- Multiple Myeloma No family history on file.  ALLERGIES:  has No Known Allergies.  MEDICATIONS:  Current Outpatient Medications  Medication Sig Dispense Refill  . alendronate (FOSAMAX) 70 MG tablet Take 70 mg by mouth every Monday. Take with a full glass of water on an empty stomach.    Marland Kitchen amLODipine (NORVASC) 5 MG tablet Take 5 mg by mouth daily.    Marland Kitchen  aspirin EC 81 MG tablet Take 81 mg by mouth daily.    Marland Kitchen atorvastatin (LIPITOR) 40 MG tablet Take 40 mg by mouth at bedtime.    . Calcium Carbonate-Vitamin D3 (CALCIUM 600-D) 600-400 MG-UNIT TABS Take 1 tablet by mouth 2 (two) times daily with a meal.    . clopidogrel (PLAVIX) 75 MG tablet Take 75 mg by mouth daily.    . ferrous sulfate 325 (65 FE) MG tablet Take by mouth.    Marland Kitchen glipiZIDE (GLUCOTROL) 5 MG tablet Take by mouth daily before breakfast.    . metoprolol succinate (TOPROL-XL) 25 MG 24 hr tablet Take 25 mg by mouth daily.    Marland Kitchen omeprazole (PRILOSEC) 20 MG capsule Take 20 mg by mouth every morning.    . ondansetron (ZOFRAN) 4 MG tablet Take 1 tablet (4 mg total) by mouth daily as needed for nausea or vomiting. 10 tablet 0  . tiZANidine (ZANAFLEX) 2 MG tablet Take 2 mg by mouth 3 (three) times daily as needed for muscle spasms.    Marland Kitchen torsemide (DEMADEX) 20 MG tablet Take 1 tablet by mouth daily.    . traMADol (ULTRAM) 50 MG tablet Take 50 mg by mouth 3 (three) times daily as needed for moderate pain.     . traZODone (DESYREL) 50 MG tablet Take 50 mg by mouth at bedtime as needed for sleep.     No current facility-administered medications for this visit.       Marland Kitchen  PHYSICAL EXAMINATION: ECOG PERFORMANCE STATUS: 1 - Symptomatic but completely ambulatory  Vitals:   08/01/19 1304  BP: 138/69  Pulse: 69  Temp: 99.1 F (37.3 C)   Filed Weights   08/01/19 1304  Weight: 130 lb (59 kg)    Physical Exam  Constitutional: She is oriented to person, place, and time.  With son; in a wheel chair.  Frail-appearing.  She is on room air.  HENT:  Head: Normocephalic and atraumatic.  Mouth/Throat: Oropharynx is clear and moist. No oropharyngeal exudate.  Eyes: Pupils are equal, round, and reactive to light.  Neck: Normal range of motion. Neck supple.  Cardiovascular: Normal rate and regular rhythm.  Pulmonary/Chest: No respiratory distress. She has no wheezes.  Decreased air entry  bilaterally.  Abdominal: Soft. Bowel sounds are normal. She exhibits no distension and no mass. There is no abdominal tenderness. There is no rebound and no guarding.  Musculoskeletal: Normal range of motion.        General: No tenderness or edema.  Neurological: She is alert and oriented to person, place, and time.  Skin: Skin is warm.  Psychiatric: Affect normal.     LABORATORY DATA:  I have reviewed the data as listed Lab Results  Component Value Date   WBC 6.3 12/27/2018   HGB 12.0 12/27/2018   HCT 36.4 12/27/2018   MCV  88.8 12/27/2018   PLT 265 12/27/2018   Recent Labs    12/27/18 1433  NA 140  K 3.9  CL 103  CO2 31  GLUCOSE 183*  BUN 31*  CREATININE 2.48*  CALCIUM 9.0  GFRNONAA 17*  GFRAA 20*  PROT 7.6  ALBUMIN 3.4*  AST 13*  ALT 11  ALKPHOS 77  BILITOT 0.5    RADIOGRAPHIC STUDIES: I have personally reviewed the radiological images as listed and agreed with the findings in the report. No results found.  ASSESSMENT & PLAN:   Anemia due to stage 3 chronic kidney disease ([place]) # Anemia/sec to CKD  Status post IV iron infusion hemoglobin improved up to 12.5.  Recommend p.o. iron every other day.  Patient symptoms are at baseline no worsening fatigue or shortness of breath.  # CKD [DM/HTN]-stage IV-clinically stable; continue follow-up with nephrology.  # MGUS-IgM lambda 0.5 gm/dl; K/L= Normal [feb 2020; do not clinically suspect active multiple myeloma.    #Back pain-chronic compression fractures stable  #Discussed regarding blood work regarding evaluation of hemoglobin/renal function.  Patient declined stating that she is awaiting to have blood work with nephrology and PCP soon.  And if significantly abnormal she will let us know.  # I spoke at length with the patient's family/brother- regarding the patient's clinical status/plan of care.  Family agreement.    # DISPOSITION:  # No venofer today.  # follow up in 6 months-MDlabs-cbc/bmp/iron  studies/ferritin-possible venofer-Dr.B  Cc; Dr.L /Dr.Singh  All questions were answered. The patient knows to call the clinic with any problems, questions or concerns.    Cammie Sickle, MD 08/07/2019 7:45 AM

## 2019-08-07 ENCOUNTER — Encounter: Payer: Self-pay | Admitting: Internal Medicine

## 2019-11-12 IMAGING — CR DG BONE SURVEY MET
1 series · 8 of 8 positions shown · non-contrast
Comparison: Thoracic MRI 11/23/2015

CLINICAL DATA: Anemia, multiple myeloma

EXAM:
METASTATIC BONE SURVEY

[Series 1: dg bone survey met · 0.14mm/px · 8 of 21 slices shown]
[im 1/21]
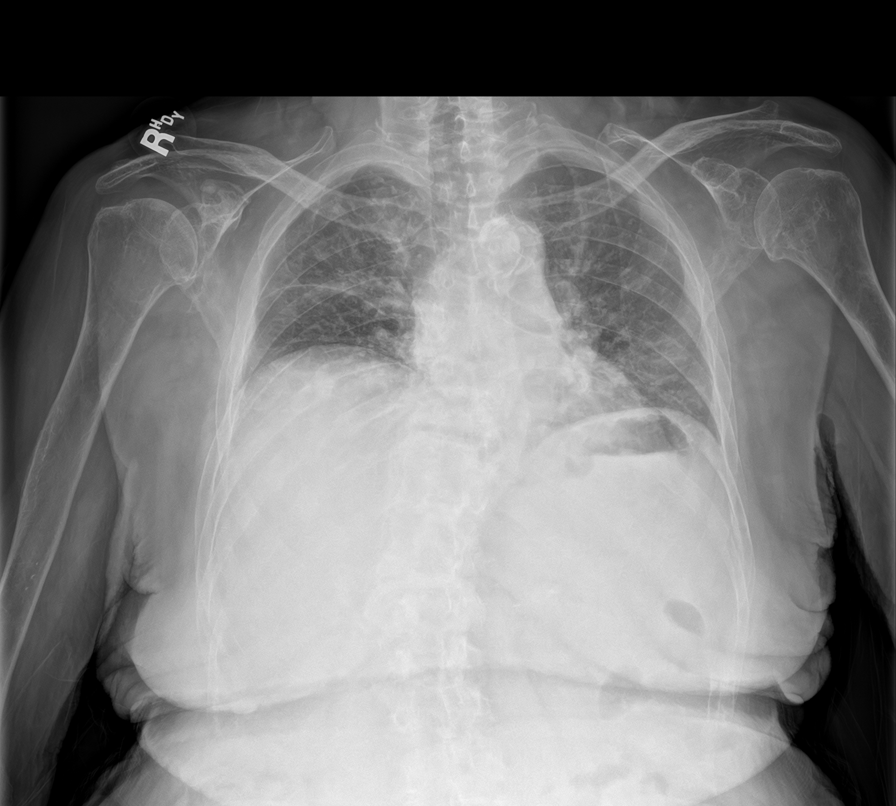
[im 3/21]
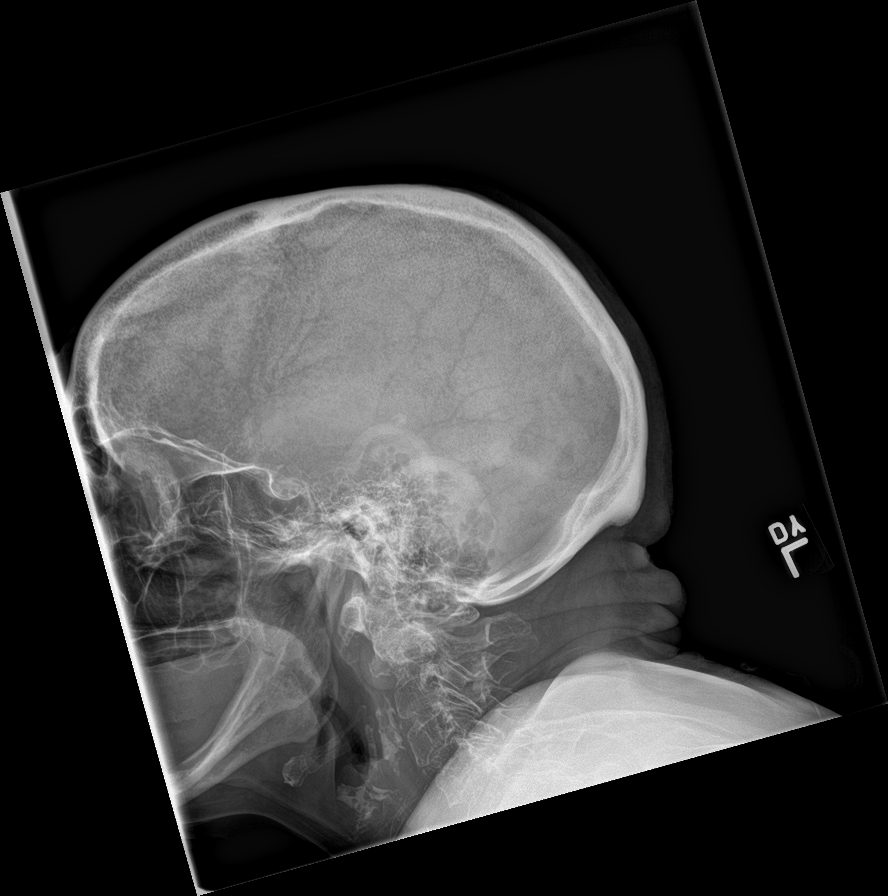
[im 6/21]
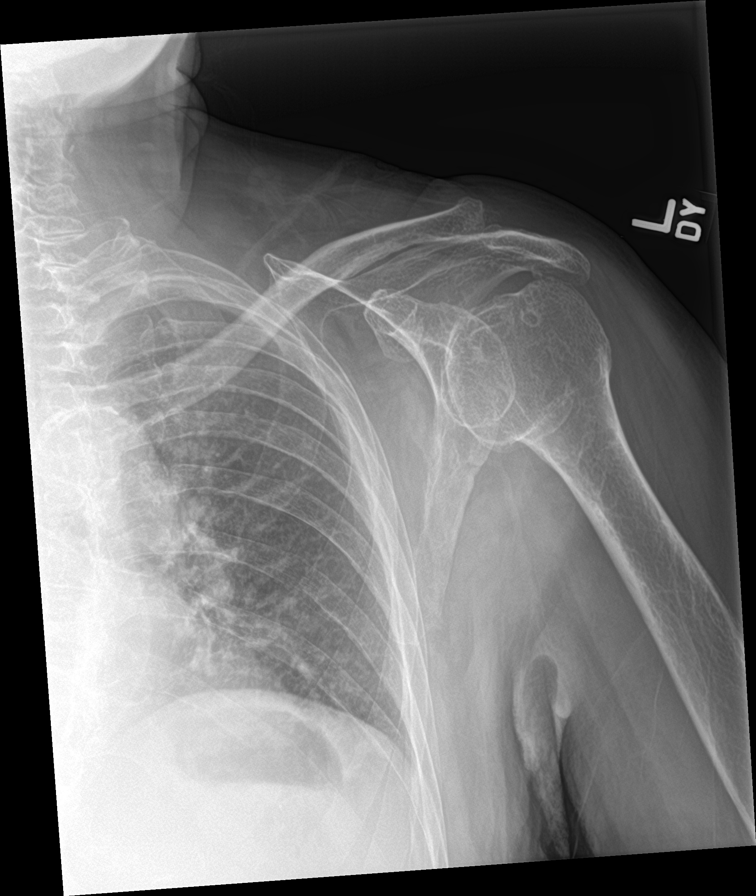
[im 9/21]
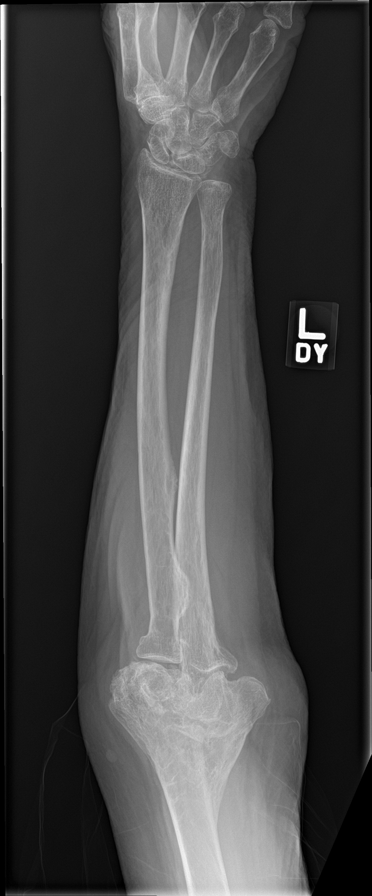
[im 12/21]
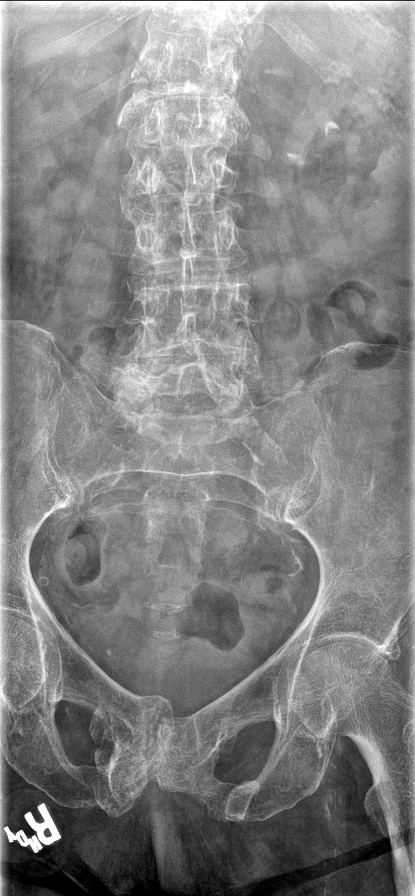
[im 15/21]
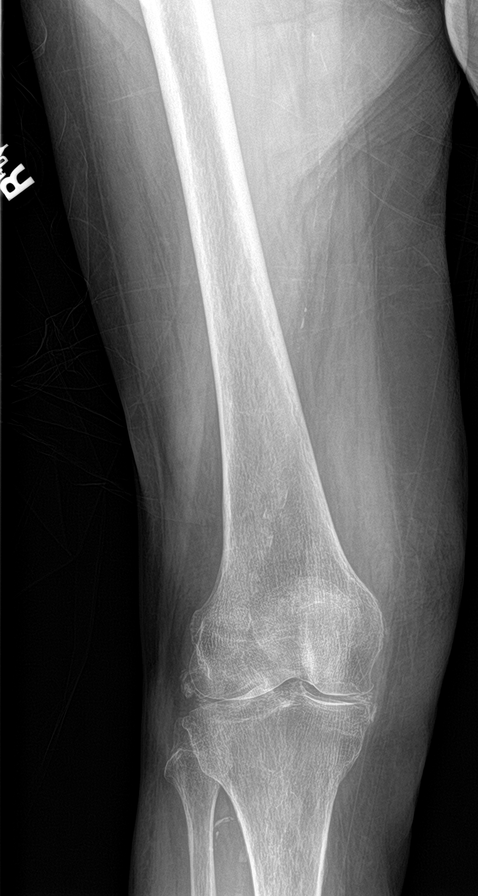
[im 18/21]
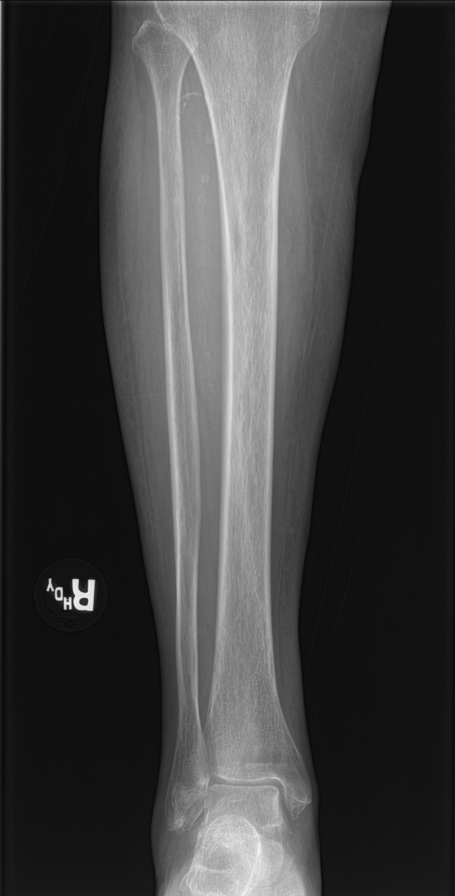
[im 21/21]
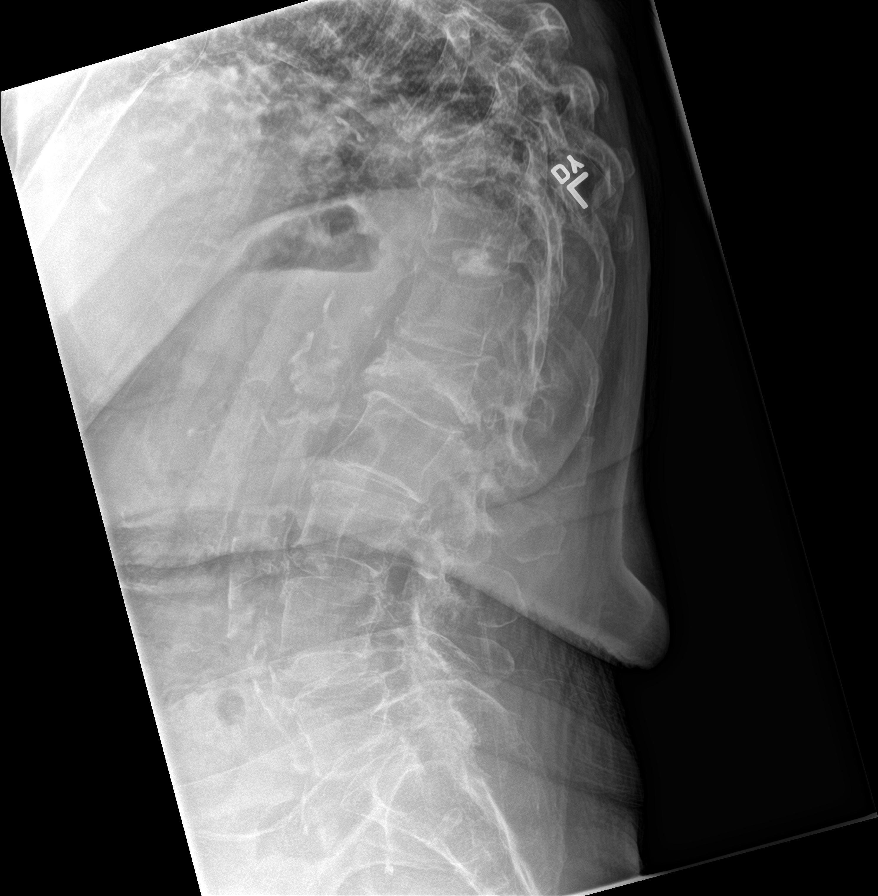

[8 of 8 positions shown; findings below may reference images not displayed]

FINDINGS: No lytic lesions of multiple myeloma identified.  Negative skull.

Chronic right rib fractures. Chronic fracture deformity of the pubic
symphysis. Numerous spinal fractures including approximately T8, T9,
T10, L1. Fractures of T8 and T10 were present on the prior MRI with
progression of the fracture T10 since that time. T9 and L1 fractures
were not present previously. Age indeterminate fractures.
Accentuated dorsal kyphosis. Lumbar dextroscoliosis.

Extensive atherosclerotic disease.

Degenerative change in the knees bilaterally with chondrocalcinosis.
Degenerative change in the left elbow and wrist.
IMPRESSION: No lytic lesions of  multiple myeloma.

Compression fractures T8, T9, T10, and L1 are present of
indeterminate age and etiology. These may be due to osteopenia
however multiple myeloma can lead to fracture. Correlate with pain.
If the patient has pain in this area, consider follow-up MRI of the
thoracic spine.

## 2019-12-01 DIAGNOSIS — R600 Localized edema: Secondary | ICD-10-CM | POA: Diagnosis not present

## 2019-12-01 DIAGNOSIS — N184 Chronic kidney disease, stage 4 (severe): Secondary | ICD-10-CM | POA: Diagnosis not present

## 2019-12-01 DIAGNOSIS — E1129 Type 2 diabetes mellitus with other diabetic kidney complication: Secondary | ICD-10-CM | POA: Diagnosis not present

## 2019-12-01 DIAGNOSIS — I10 Essential (primary) hypertension: Secondary | ICD-10-CM | POA: Diagnosis not present

## 2019-12-01 DIAGNOSIS — D472 Monoclonal gammopathy: Secondary | ICD-10-CM | POA: Diagnosis not present

## 2019-12-04 DIAGNOSIS — J3489 Other specified disorders of nose and nasal sinuses: Secondary | ICD-10-CM | POA: Diagnosis not present

## 2019-12-04 DIAGNOSIS — N184 Chronic kidney disease, stage 4 (severe): Secondary | ICD-10-CM | POA: Diagnosis not present

## 2019-12-04 DIAGNOSIS — R0609 Other forms of dyspnea: Secondary | ICD-10-CM | POA: Diagnosis not present

## 2019-12-04 DIAGNOSIS — C9000 Multiple myeloma not having achieved remission: Secondary | ICD-10-CM | POA: Diagnosis not present

## 2019-12-04 DIAGNOSIS — E1122 Type 2 diabetes mellitus with diabetic chronic kidney disease: Secondary | ICD-10-CM | POA: Diagnosis not present

## 2019-12-04 DIAGNOSIS — E785 Hyperlipidemia, unspecified: Secondary | ICD-10-CM | POA: Diagnosis not present

## 2019-12-04 DIAGNOSIS — J9811 Atelectasis: Secondary | ICD-10-CM | POA: Diagnosis not present

## 2019-12-04 DIAGNOSIS — I10 Essential (primary) hypertension: Secondary | ICD-10-CM | POA: Diagnosis not present

## 2019-12-04 DIAGNOSIS — E1169 Type 2 diabetes mellitus with other specified complication: Secondary | ICD-10-CM | POA: Diagnosis not present

## 2019-12-04 DIAGNOSIS — E782 Mixed hyperlipidemia: Secondary | ICD-10-CM | POA: Diagnosis not present

## 2019-12-04 DIAGNOSIS — I129 Hypertensive chronic kidney disease with stage 1 through stage 4 chronic kidney disease, or unspecified chronic kidney disease: Secondary | ICD-10-CM | POA: Diagnosis not present

## 2019-12-12 DIAGNOSIS — R9389 Abnormal findings on diagnostic imaging of other specified body structures: Secondary | ICD-10-CM | POA: Diagnosis not present

## 2019-12-12 DIAGNOSIS — R600 Localized edema: Secondary | ICD-10-CM | POA: Diagnosis not present

## 2019-12-12 DIAGNOSIS — I517 Cardiomegaly: Secondary | ICD-10-CM | POA: Diagnosis not present

## 2019-12-12 DIAGNOSIS — R609 Edema, unspecified: Secondary | ICD-10-CM | POA: Diagnosis not present

## 2019-12-17 DIAGNOSIS — N184 Chronic kidney disease, stage 4 (severe): Secondary | ICD-10-CM | POA: Diagnosis not present

## 2019-12-17 DIAGNOSIS — I10 Essential (primary) hypertension: Secondary | ICD-10-CM | POA: Diagnosis not present

## 2019-12-17 DIAGNOSIS — E785 Hyperlipidemia, unspecified: Secondary | ICD-10-CM | POA: Diagnosis not present

## 2019-12-17 DIAGNOSIS — C9000 Multiple myeloma not having achieved remission: Secondary | ICD-10-CM | POA: Diagnosis not present

## 2019-12-17 DIAGNOSIS — E1169 Type 2 diabetes mellitus with other specified complication: Secondary | ICD-10-CM | POA: Diagnosis not present

## 2020-01-24 ENCOUNTER — Other Ambulatory Visit: Payer: Self-pay | Admitting: Nurse Practitioner

## 2020-01-28 ENCOUNTER — Inpatient Hospital Stay: Payer: Medicare HMO

## 2020-01-30 ENCOUNTER — Inpatient Hospital Stay: Payer: Medicare HMO | Admitting: Internal Medicine

## 2020-01-30 ENCOUNTER — Inpatient Hospital Stay: Payer: Medicare HMO

## 2020-01-31 ENCOUNTER — Other Ambulatory Visit: Payer: Self-pay | Admitting: Nurse Practitioner

## 2020-02-13 ENCOUNTER — Telehealth: Payer: Self-pay | Admitting: Internal Medicine

## 2020-02-13 NOTE — Telephone Encounter (Signed)
Left vm on mobile # for Carrie Craig [facility]) that call is being returned.

## 2020-02-13 NOTE — Telephone Encounter (Signed)
Pt brother is calling about the infusion she has scheduled on 3/31. He wants to know why she is needing it. He did state he is her HCPOA. The patient also left a message requesting we contact her brother in regards to the appt also.

## 2020-02-13 NOTE — Telephone Encounter (Signed)
Patient's brother Carrie Craig returned my phone call. Discussed the apt times/dates with the patient's brother and reason for the appointment.

## 2020-02-16 ENCOUNTER — Inpatient Hospital Stay: Payer: Medicare HMO | Attending: Internal Medicine

## 2020-02-16 DIAGNOSIS — D631 Anemia in chronic kidney disease: Secondary | ICD-10-CM | POA: Insufficient documentation

## 2020-02-16 DIAGNOSIS — N184 Chronic kidney disease, stage 4 (severe): Secondary | ICD-10-CM | POA: Insufficient documentation

## 2020-02-16 LAB — CBC WITH DIFFERENTIAL/PLATELET
Abs Immature Granulocytes: 0.02 10*3/uL (ref 0.00–0.07)
Basophils Absolute: 0.1 10*3/uL (ref 0.0–0.1)
Basophils Relative: 1 %
Eosinophils Absolute: 0.3 10*3/uL (ref 0.0–0.5)
Eosinophils Relative: 4 %
HCT: 36.6 % (ref 36.0–46.0)
Hemoglobin: 12.1 g/dL (ref 12.0–15.0)
Immature Granulocytes: 0 %
Lymphocytes Relative: 18 %
Lymphs Abs: 1.1 10*3/uL (ref 0.7–4.0)
MCH: 28.7 pg (ref 26.0–34.0)
MCHC: 33.1 g/dL (ref 30.0–36.0)
MCV: 86.9 fL (ref 80.0–100.0)
Monocytes Absolute: 0.3 10*3/uL (ref 0.1–1.0)
Monocytes Relative: 5 %
Neutro Abs: 4.7 10*3/uL (ref 1.7–7.7)
Neutrophils Relative %: 72 %
Platelets: 277 10*3/uL (ref 150–400)
RBC: 4.21 MIL/uL (ref 3.87–5.11)
RDW: 13.7 % (ref 11.5–15.5)
WBC: 6.5 10*3/uL (ref 4.0–10.5)
nRBC: 0 % (ref 0.0–0.2)

## 2020-02-16 LAB — BASIC METABOLIC PANEL
Anion gap: 10 (ref 5–15)
BUN: 26 mg/dL — ABNORMAL HIGH (ref 8–23)
CO2: 25 mmol/L (ref 22–32)
Calcium: 9.4 mg/dL (ref 8.9–10.3)
Chloride: 104 mmol/L (ref 98–111)
Creatinine, Ser: 2.68 mg/dL — ABNORMAL HIGH (ref 0.44–1.00)
GFR calc Af Amer: 18 mL/min — ABNORMAL LOW (ref >60)
GFR calc non Af Amer: 16 mL/min — ABNORMAL LOW (ref >60)
Glucose, Bld: 148 mg/dL — ABNORMAL HIGH (ref 70–99)
Potassium: 3.9 mmol/L (ref 3.5–5.1)
Sodium: 139 mmol/L (ref 135–145)

## 2020-02-16 LAB — FERRITIN: Ferritin: 45 ng/mL (ref 11–307)

## 2020-02-16 LAB — IRON AND TIBC
Iron: 52 ug/dL (ref 28–170)
Saturation Ratios: 16 % (ref 10.4–31.8)
TIBC: 316 ug/dL (ref 250–450)
UIBC: 264 ug/dL

## 2020-02-17 DIAGNOSIS — I10 Essential (primary) hypertension: Secondary | ICD-10-CM | POA: Diagnosis not present

## 2020-02-17 DIAGNOSIS — D472 Monoclonal gammopathy: Secondary | ICD-10-CM | POA: Diagnosis not present

## 2020-02-17 DIAGNOSIS — N184 Chronic kidney disease, stage 4 (severe): Secondary | ICD-10-CM | POA: Diagnosis not present

## 2020-02-17 DIAGNOSIS — E1129 Type 2 diabetes mellitus with other diabetic kidney complication: Secondary | ICD-10-CM | POA: Diagnosis not present

## 2020-02-18 ENCOUNTER — Inpatient Hospital Stay: Payer: Medicare HMO

## 2020-02-18 ENCOUNTER — Inpatient Hospital Stay (HOSPITAL_BASED_OUTPATIENT_CLINIC_OR_DEPARTMENT_OTHER): Payer: Medicare HMO | Admitting: Internal Medicine

## 2020-02-18 ENCOUNTER — Other Ambulatory Visit: Payer: Self-pay

## 2020-02-18 VITALS — BP 153/81 | HR 69 | Resp 20

## 2020-02-18 DIAGNOSIS — N1832 Chronic kidney disease, stage 3b: Secondary | ICD-10-CM

## 2020-02-18 DIAGNOSIS — N183 Chronic kidney disease, stage 3 unspecified: Secondary | ICD-10-CM

## 2020-02-18 DIAGNOSIS — N184 Chronic kidney disease, stage 4 (severe): Secondary | ICD-10-CM | POA: Diagnosis not present

## 2020-02-18 DIAGNOSIS — D631 Anemia in chronic kidney disease: Secondary | ICD-10-CM | POA: Diagnosis not present

## 2020-02-18 MED ORDER — IRON SUCROSE 20 MG/ML IV SOLN
200.0000 mg | Freq: Once | INTRAVENOUS | Status: AC
  Administered 2020-02-18: 200 mg via INTRAVENOUS
  Filled 2020-02-18: qty 10

## 2020-02-18 MED ORDER — SODIUM CHLORIDE 0.9 % IV SOLN
Freq: Once | INTRAVENOUS | Status: AC
  Filled 2020-02-18: qty 250

## 2020-02-18 NOTE — Progress Notes (Signed)
[facility] CONSULT NOTE  Patient Care Team: Dion Body, MD as PCP - General (Family Medicine)  CHIEF COMPLAINTS/PURPOSE OF CONSULTATION:  Anemia  # SEP 2018- ANEMIA ;Hb 10 - IDA/CKDIV[July 2019-IV venofer];   # MGUS IgM [place]- [ July 2019- 0.7 gm/dl; Dr.L] K/L=N;   # CKD stage IV [Dr.Singh DM/HTN]; Hx of falls;   Oncology History   No history exists.     HISTORY OF PRESENTING ILLNESS:  Carrie Craig 86 y.o.  female long-standing history of diabetes/hypertension-anemia of chronic kidney disease here for follow-up.  Patient interim was evaluated by nephrology Dr. Candiss Norse.  Noted to have worsening renal function.  However no dialysis needs.  Patient is chronic shortness of breath chronic cough.  Especially exertion.  Chronic swelling the legs no worse.   Review of Systems  Constitutional: Positive for malaise/fatigue. Negative for chills, diaphoresis, fever and weight loss.  HENT: Negative for nosebleeds and sore throat.   Eyes: Negative for double vision.  Respiratory: Positive for shortness of breath. Negative for cough, hemoptysis, sputum production and wheezing.   Cardiovascular: Negative for chest pain, palpitations, orthopnea and leg swelling.  Gastrointestinal: Negative for abdominal pain, blood in stool, constipation, diarrhea, heartburn, melena, nausea and vomiting.  Genitourinary: Negative for dysuria, frequency and urgency.  Musculoskeletal: Negative for back pain and joint pain.  Skin: Negative.  Negative for itching and rash.  Neurological: Negative for dizziness, tingling, focal weakness, weakness and headaches.  Endo/Heme/Allergies: Does not bruise/bleed easily.  Psychiatric/Behavioral: Negative for depression. The patient has insomnia. The patient is not nervous/anxious.      MEDICAL HISTORY:  Past Medical History:  Diagnosis Date  . Cancer ([place])    multiple myeloma  . Diabetes mellitus without complication ([place])   . GERD  (gastroesophageal reflux disease)   . Stroke [facility])     SURGICAL HISTORY: No past surgical history on file.  SOCIAL HISTORY:  Social History   Socioeconomic History  . Marital status: Widowed    Spouse name: Not on file  . Number of children: Not on file  . Years of education: Not on file  . Highest education level: Not on file  Occupational History  . Not on file  Tobacco Use  . Smoking status: Never Smoker  . Smokeless tobacco: Never Used  Substance and Sexual Activity  . Alcohol use: No  . Drug use: No  . Sexual activity: Not on file  Other Topics Concern  . Not on file  Social History Narrative   used to live in [place] [until 1 year]; lives in [facility]. 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 Uses walker.    Social Determinants of Health   Financial Resource Strain:   . Difficulty of Paying Living Expenses:   Food Insecurity:   . Worried About Charity fundraiser in the Last Year:   . Arboriculturist in the Last Year:   Transportation Needs:   . Film/video editor (Medical):   Marland Kitchen Lack of Transportation (Non-Medical):   Physical Activity:   . Days of Exercise per Week:   . Minutes of Exercise per Session:   Stress:   . Feeling of Stress :   Social Connections:   . Frequency of Communication with Friends and Family:   . Frequency of Social Gatherings with Friends and Family:   . Attends Religious Services:   . Active Member of Clubs or Organizations:   . Attends Archivist Meetings:   .  Marital Status:   Intimate Partner Violence:   . Fear of Current or Ex-Partner:   . Emotionally Abused:   Marland Kitchen Physically Abused:   . Sexually Abused:     FAMILY HISTORY: mother- Multiple Myeloma No family history on file.  ALLERGIES:  has No Known Allergies.  MEDICATIONS:  Current Outpatient Medications  Medication Sig Dispense Refill  . alendronate (FOSAMAX) 70 MG tablet Take 70 mg by mouth every Monday. Take with a full glass of water on an  empty stomach.    Marland Kitchen amLODipine (NORVASC) 5 MG tablet Take 5 mg by mouth daily.    Marland Kitchen aspirin EC 81 MG tablet Take 81 mg by mouth daily.    Marland Kitchen atorvastatin (LIPITOR) 40 MG tablet Take 40 mg by mouth at bedtime.    . Calcium Carbonate-Vitamin D3 (CALCIUM 600-D) 600-400 MG-UNIT TABS Take 1 tablet by mouth 2 (two) times daily with a meal.    . clopidogrel (PLAVIX) 75 MG tablet Take 75 mg by mouth daily.    . ferrous sulfate 325 (65 FE) MG tablet Take by mouth.    Marland Kitchen glipiZIDE (GLUCOTROL) 5 MG tablet Take by mouth daily before breakfast.    . metoprolol succinate (TOPROL-XL) 25 MG 24 hr tablet Take 25 mg by mouth daily.    Marland Kitchen omeprazole (PRILOSEC) 20 MG capsule Take 20 mg by mouth every morning.    . ondansetron (ZOFRAN) 4 MG tablet Take 1 tablet (4 mg total) by mouth daily as needed for nausea or vomiting. 10 tablet 0  . tiZANidine (ZANAFLEX) 2 MG tablet Take 2 mg by mouth 3 (three) times daily as needed for muscle spasms.    Marland Kitchen torsemide (DEMADEX) 20 MG tablet Take 1 tablet by mouth daily.    . traMADol (ULTRAM) 50 MG tablet Take 50 mg by mouth 3 (three) times daily as needed for moderate pain.     . traZODone (DESYREL) 50 MG tablet Take 50 mg by mouth at bedtime as needed for sleep.     No current facility-administered medications for this visit.      Marland Kitchen  PHYSICAL EXAMINATION: ECOG PERFORMANCE STATUS: 1 - Symptomatic but completely ambulatory  Vitals:   02/18/20 1303  BP: (!) 159/68  Pulse: 71  Temp: (!) 97.1 F (36.2 C)   Filed Weights   02/18/20 1303  Weight: 129 lb (58.5 kg)    Physical Exam  Constitutional: She is oriented to person, place, and time.  With son; in a wheel chair.  Frail-appearing.  She is on room air.  HENT:  Head: Normocephalic and atraumatic.  Mouth/Throat: Oropharynx is clear and moist. No oropharyngeal exudate.  Eyes: Pupils are equal, round, and reactive to light.  Cardiovascular: Normal rate and regular rhythm.  Pulmonary/Chest: No respiratory distress.  She has no wheezes.  Decreased air entry bilaterally.  Abdominal: Soft. Bowel sounds are normal. She exhibits no distension and no mass. There is no abdominal tenderness. There is no rebound and no guarding.  Musculoskeletal:        General: No tenderness or edema. Normal range of motion.     Cervical back: Normal range of motion and neck supple.  Neurological: She is alert and oriented to person, place, and time.  Skin: Skin is warm.  Psychiatric: Affect normal.     LABORATORY DATA:  I have reviewed the data as listed Lab Results  Component Value Date   WBC 6.5 02/16/2020   HGB 12.1 02/16/2020   HCT 36.6 02/16/2020  MCV 86.9 02/16/2020   PLT 277 02/16/2020   Recent Labs    02/16/20 1113  NA 139  K 3.9  CL 104  CO2 25  GLUCOSE 148*  BUN 26*  CREATININE 2.68*  CALCIUM 9.4  GFRNONAA 16*  GFRAA 18*    RADIOGRAPHIC STUDIES: I have personally reviewed the radiological images as listed and agreed with the findings in the report. No results found.  ASSESSMENT & PLAN:   Anemia due to stage 3 chronic kidney disease    Anemia due to stage 4 chronic kidney disease ([place]) # Anemia/sec to CKD-currently on p.o. iron every other day.  Patient symptoms are at baseline no worsening fatigue or shortness of breath.  #Today hemoglobin is 12.1; iron saturations 16%; ferritin 45%.  Proceed with IV iron infusion today.  # CKD [DM/HTN]-stage IV-[Dr.Singh] STABLE.  # MGUS-IgM lambda 0.5 gm/dl; K/L= Normal [feb 2020; do not clinically suspect active multiple myeloma.  Will order myeloma panel next visit  #Back pain-chronic compression fractures stable  # I spoke at length with the patient's family/brother- regarding the patient's clinical status/plan of care.  Family agreement.   # DISPOSITION:  #Venofer today.  # follow up in 6 months-MDlabs-cbc/bmp/iron studies/ferritin; MM work-up possible venofer-Dr.B  Cc; Dr.L /Dr.Singh All questions were answered. The patient knows to  call the clinic with any problems, questions or concerns.    Carrie Sickle, MD 02/18/2020 1:24 PM

## 2020-02-18 NOTE — Assessment & Plan Note (Addendum)
#  Anemia/sec to CKD-currently on p.o. iron every other day.  Patient symptoms are at baseline no worsening fatigue or shortness of breath.  #Today hemoglobin is 12.1; iron saturations 16%; ferritin 45%.  Proceed with IV iron infusion today.  # CKD [DM/HTN]-stage IV-[Dr.Singh] STABLE.  # MGUS-IgM lambda 0.5 gm/dl; K/L= Normal [feb 2020; do not clinically suspect active multiple myeloma.  Will order myeloma panel next visit  #Back pain-chronic compression fractures stable  # I spoke at length with the patient's family/brother- regarding the patient's clinical status/plan of care.  Family agreement.   # DISPOSITION:  #Venofer today.  # follow up in 6 months-MDlabs-cbc/bmp/iron studies/ferritin; MM work-up possible venofer-Dr.B  Cc; Dr.L /Dr.Singh

## 2020-04-15 DIAGNOSIS — D472 Monoclonal gammopathy: Secondary | ICD-10-CM | POA: Diagnosis not present

## 2020-04-15 DIAGNOSIS — E1129 Type 2 diabetes mellitus with other diabetic kidney complication: Secondary | ICD-10-CM | POA: Diagnosis not present

## 2020-04-15 DIAGNOSIS — I10 Essential (primary) hypertension: Secondary | ICD-10-CM | POA: Diagnosis not present

## 2020-04-15 DIAGNOSIS — N184 Chronic kidney disease, stage 4 (severe): Secondary | ICD-10-CM | POA: Diagnosis not present

## 2020-04-20 DIAGNOSIS — D472 Monoclonal gammopathy: Secondary | ICD-10-CM | POA: Diagnosis not present

## 2020-04-20 DIAGNOSIS — N184 Chronic kidney disease, stage 4 (severe): Secondary | ICD-10-CM | POA: Diagnosis not present

## 2020-04-20 DIAGNOSIS — I10 Essential (primary) hypertension: Secondary | ICD-10-CM | POA: Diagnosis not present

## 2020-04-20 DIAGNOSIS — E1129 Type 2 diabetes mellitus with other diabetic kidney complication: Secondary | ICD-10-CM | POA: Diagnosis not present

## 2020-06-08 DIAGNOSIS — I10 Essential (primary) hypertension: Secondary | ICD-10-CM | POA: Diagnosis not present

## 2020-06-08 DIAGNOSIS — E782 Mixed hyperlipidemia: Secondary | ICD-10-CM | POA: Diagnosis not present

## 2020-06-08 DIAGNOSIS — N184 Chronic kidney disease, stage 4 (severe): Secondary | ICD-10-CM | POA: Diagnosis not present

## 2020-06-08 DIAGNOSIS — E1169 Type 2 diabetes mellitus with other specified complication: Secondary | ICD-10-CM | POA: Diagnosis not present

## 2020-06-08 DIAGNOSIS — E785 Hyperlipidemia, unspecified: Secondary | ICD-10-CM | POA: Diagnosis not present

## 2020-06-08 DIAGNOSIS — D638 Anemia in other chronic diseases classified elsewhere: Secondary | ICD-10-CM | POA: Diagnosis not present

## 2020-06-15 DIAGNOSIS — Z0000 Encounter for general adult medical examination without abnormal findings: Secondary | ICD-10-CM | POA: Diagnosis not present

## 2020-06-15 DIAGNOSIS — E782 Mixed hyperlipidemia: Secondary | ICD-10-CM | POA: Diagnosis not present

## 2020-06-15 DIAGNOSIS — I10 Essential (primary) hypertension: Secondary | ICD-10-CM | POA: Diagnosis not present

## 2020-06-15 DIAGNOSIS — N184 Chronic kidney disease, stage 4 (severe): Secondary | ICD-10-CM | POA: Diagnosis not present

## 2020-08-11 ENCOUNTER — Other Ambulatory Visit: Payer: Self-pay | Admitting: *Deleted

## 2020-08-11 DIAGNOSIS — N183 Chronic kidney disease, stage 3 unspecified: Secondary | ICD-10-CM

## 2020-08-16 ENCOUNTER — Inpatient Hospital Stay: Payer: Medicare HMO | Attending: Internal Medicine

## 2020-08-16 ENCOUNTER — Other Ambulatory Visit: Payer: Self-pay

## 2020-08-16 DIAGNOSIS — N1832 Chronic kidney disease, stage 3b: Secondary | ICD-10-CM | POA: Diagnosis not present

## 2020-08-16 DIAGNOSIS — D631 Anemia in chronic kidney disease: Secondary | ICD-10-CM | POA: Diagnosis not present

## 2020-08-16 DIAGNOSIS — N183 Chronic kidney disease, stage 3 unspecified: Secondary | ICD-10-CM

## 2020-08-16 LAB — COMPREHENSIVE METABOLIC PANEL
ALT: 10 U/L (ref 0–44)
AST: 16 U/L (ref 15–41)
Albumin: 3.9 g/dL (ref 3.5–5.0)
Alkaline Phosphatase: 77 U/L (ref 38–126)
Anion gap: 8 (ref 5–15)
BUN: 32 mg/dL — ABNORMAL HIGH (ref 8–23)
CO2: 27 mmol/L (ref 22–32)
Calcium: 8.9 mg/dL (ref 8.9–10.3)
Chloride: 103 mmol/L (ref 98–111)
Creatinine, Ser: 3.42 mg/dL — ABNORMAL HIGH (ref 0.44–1.00)
GFR calc Af Amer: 13 mL/min — ABNORMAL LOW (ref >60)
GFR calc non Af Amer: 12 mL/min — ABNORMAL LOW (ref >60)
Glucose, Bld: 142 mg/dL — ABNORMAL HIGH (ref 70–99)
Potassium: 4.1 mmol/L (ref 3.5–5.1)
Sodium: 138 mmol/L (ref 135–145)
Total Bilirubin: 0.5 mg/dL (ref 0.3–1.2)
Total Protein: 7.8 g/dL (ref 6.5–8.1)

## 2020-08-16 LAB — CBC WITH DIFFERENTIAL/PLATELET
Abs Immature Granulocytes: 0.03 10*3/uL (ref 0.00–0.07)
Basophils Absolute: 0.1 10*3/uL (ref 0.0–0.1)
Basophils Relative: 1 %
Eosinophils Absolute: 0.3 10*3/uL (ref 0.0–0.5)
Eosinophils Relative: 5 %
HCT: 35.2 % — ABNORMAL LOW (ref 36.0–46.0)
Hemoglobin: 12.4 g/dL (ref 12.0–15.0)
Immature Granulocytes: 1 %
Lymphocytes Relative: 22 %
Lymphs Abs: 1.4 10*3/uL (ref 0.7–4.0)
MCH: 30.7 pg (ref 26.0–34.0)
MCHC: 35.2 g/dL (ref 30.0–36.0)
MCV: 87.1 fL (ref 80.0–100.0)
Monocytes Absolute: 0.4 10*3/uL (ref 0.1–1.0)
Monocytes Relative: 6 %
Neutro Abs: 4.2 10*3/uL (ref 1.7–7.7)
Neutrophils Relative %: 65 %
Platelets: 274 10*3/uL (ref 150–400)
RBC: 4.04 MIL/uL (ref 3.87–5.11)
RDW: 13.2 % (ref 11.5–15.5)
WBC: 6.4 10*3/uL (ref 4.0–10.5)
nRBC: 0 % (ref 0.0–0.2)

## 2020-08-16 LAB — IRON AND TIBC
Iron: 61 ug/dL (ref 28–170)
Saturation Ratios: 22 % (ref 10.4–31.8)
TIBC: 280 ug/dL (ref 250–450)
UIBC: 219 ug/dL

## 2020-08-16 LAB — FERRITIN: Ferritin: 119 ng/mL (ref 11–307)

## 2020-08-17 LAB — MULTIPLE MYELOMA PANEL, SERUM
Albumin SerPl Elph-Mcnc: 3.7 g/dL (ref 2.9–4.4)
Albumin/Glob SerPl: 1 (ref 0.7–1.7)
Alpha 1: 0.2 g/dL (ref 0.0–0.4)
Alpha2 Glob SerPl Elph-Mcnc: 0.9 g/dL (ref 0.4–1.0)
B-Globulin SerPl Elph-Mcnc: 1 g/dL (ref 0.7–1.3)
Gamma Glob SerPl Elph-Mcnc: 1.7 g/dL (ref 0.4–1.8)
Globulin, Total: 3.9 g/dL (ref 2.2–3.9)
IgA: 125 mg/dL (ref 64–422)
IgG (Immunoglobin G), Serum: 1110 mg/dL (ref 586–1602)
IgM (Immunoglobulin M), Srm: 934 mg/dL — ABNORMAL HIGH (ref 26–217)
M Protein SerPl Elph-Mcnc: 0.6 g/dL — ABNORMAL HIGH
Total Protein ELP: 7.6 g/dL (ref 6.0–8.5)

## 2020-08-17 LAB — KAPPA/LAMBDA LIGHT CHAINS
Kappa free light chain: 99.5 mg/L — ABNORMAL HIGH (ref 3.3–19.4)
Kappa, lambda light chain ratio: 1.01 (ref 0.26–1.65)
Lambda free light chains: 98.9 mg/L — ABNORMAL HIGH (ref 5.7–26.3)

## 2020-08-18 ENCOUNTER — Inpatient Hospital Stay: Payer: Medicare HMO

## 2020-08-18 ENCOUNTER — Inpatient Hospital Stay: Payer: Medicare HMO | Admitting: Internal Medicine

## 2020-08-18 ENCOUNTER — Other Ambulatory Visit: Payer: Medicare HMO

## 2020-11-02 DIAGNOSIS — I10 Essential (primary) hypertension: Secondary | ICD-10-CM | POA: Diagnosis not present

## 2020-11-02 DIAGNOSIS — N185 Chronic kidney disease, stage 5: Secondary | ICD-10-CM | POA: Diagnosis not present

## 2020-11-02 DIAGNOSIS — R600 Localized edema: Secondary | ICD-10-CM | POA: Diagnosis not present

## 2020-11-02 DIAGNOSIS — E1129 Type 2 diabetes mellitus with other diabetic kidney complication: Secondary | ICD-10-CM | POA: Diagnosis not present

## 2020-11-02 DIAGNOSIS — D472 Monoclonal gammopathy: Secondary | ICD-10-CM | POA: Diagnosis not present

## 2020-12-09 DIAGNOSIS — H2513 Age-related nuclear cataract, bilateral: Secondary | ICD-10-CM | POA: Diagnosis not present

## 2020-12-09 DIAGNOSIS — H40003 Preglaucoma, unspecified, bilateral: Secondary | ICD-10-CM | POA: Diagnosis not present

## 2020-12-23 DIAGNOSIS — E782 Mixed hyperlipidemia: Secondary | ICD-10-CM | POA: Diagnosis not present

## 2020-12-23 DIAGNOSIS — Z8673 Personal history of transient ischemic attack (TIA), and cerebral infarction without residual deficits: Secondary | ICD-10-CM | POA: Diagnosis not present

## 2020-12-23 DIAGNOSIS — N184 Chronic kidney disease, stage 4 (severe): Secondary | ICD-10-CM | POA: Diagnosis not present

## 2020-12-23 DIAGNOSIS — I129 Hypertensive chronic kidney disease with stage 1 through stage 4 chronic kidney disease, or unspecified chronic kidney disease: Secondary | ICD-10-CM | POA: Diagnosis not present

## 2020-12-27 DIAGNOSIS — N184 Chronic kidney disease, stage 4 (severe): Secondary | ICD-10-CM | POA: Diagnosis not present

## 2020-12-27 DIAGNOSIS — R7309 Other abnormal glucose: Secondary | ICD-10-CM | POA: Diagnosis not present

## 2020-12-30 DIAGNOSIS — N189 Chronic kidney disease, unspecified: Secondary | ICD-10-CM | POA: Diagnosis not present

## 2020-12-30 DIAGNOSIS — H2511 Age-related nuclear cataract, right eye: Secondary | ICD-10-CM | POA: Diagnosis not present

## 2021-01-11 ENCOUNTER — Other Ambulatory Visit: Payer: Self-pay

## 2021-01-11 ENCOUNTER — Encounter: Payer: Self-pay | Admitting: Ophthalmology

## 2021-01-13 ENCOUNTER — Other Ambulatory Visit
Admission: RE | Admit: 2021-01-13 | Discharge: 2021-01-13 | Disposition: A | Discharge status: Home / Self Care | Payer: Medicare HMO | Source: Ambulatory Visit | Attending: Ophthalmology | Admitting: Ophthalmology

## 2021-01-13 ENCOUNTER — Other Ambulatory Visit: Payer: Self-pay

## 2021-01-13 DIAGNOSIS — Z20822 Contact with and (suspected) exposure to covid-19: Secondary | ICD-10-CM | POA: Diagnosis not present

## 2021-01-13 DIAGNOSIS — Z01812 Encounter for preprocedural laboratory examination: Secondary | ICD-10-CM | POA: Insufficient documentation

## 2021-01-13 LAB — SARS CORONAVIRUS 2 (TAT 6-24 HRS): SARS Coronavirus 2: NEGATIVE

## 2021-01-13 NOTE — Discharge Instructions (Signed)

## 2021-01-17 ENCOUNTER — Encounter
Admission: RE | Disposition: A | Discharge status: Home / Self Care | Payer: Self-pay | Source: Home / Self Care | Attending: Ophthalmology

## 2021-01-17 ENCOUNTER — Ambulatory Visit: Payer: Medicare HMO | Admitting: Anesthesiology

## 2021-01-17 ENCOUNTER — Ambulatory Visit
Admission: RE | Admit: 2021-01-17 | Discharge: 2021-01-17 | Disposition: A | Discharge status: Home / Self Care | Payer: Medicare HMO | Attending: Ophthalmology | Admitting: Ophthalmology

## 2021-01-17 ENCOUNTER — Other Ambulatory Visit: Payer: Self-pay

## 2021-01-17 ENCOUNTER — Encounter: Payer: Self-pay | Admitting: Ophthalmology

## 2021-01-17 DIAGNOSIS — E7800 Pure hypercholesterolemia, unspecified: Secondary | ICD-10-CM | POA: Insufficient documentation

## 2021-01-17 DIAGNOSIS — H2511 Age-related nuclear cataract, right eye: Secondary | ICD-10-CM | POA: Insufficient documentation

## 2021-01-17 DIAGNOSIS — N189 Chronic kidney disease, unspecified: Secondary | ICD-10-CM | POA: Insufficient documentation

## 2021-01-17 DIAGNOSIS — I129 Hypertensive chronic kidney disease with stage 1 through stage 4 chronic kidney disease, or unspecified chronic kidney disease: Secondary | ICD-10-CM | POA: Diagnosis not present

## 2021-01-17 DIAGNOSIS — R7303 Prediabetes: Secondary | ICD-10-CM | POA: Diagnosis not present

## 2021-01-17 DIAGNOSIS — Z8673 Personal history of transient ischemic attack (TIA), and cerebral infarction without residual deficits: Secondary | ICD-10-CM | POA: Diagnosis not present

## 2021-01-17 DIAGNOSIS — H25811 Combined forms of age-related cataract, right eye: Secondary | ICD-10-CM | POA: Diagnosis not present

## 2021-01-17 HISTORY — PX: CATARACT EXTRACTION W/PHACO: SHX586

## 2021-01-17 HISTORY — DX: Presence of dental prosthetic device (complete) (partial): Z97.2

## 2021-01-17 SURGERY — PHACOEMULSIFICATION, CATARACT, WITH IOL INSERTION
Anesthesia: Monitor Anesthesia Care | Site: Eye | Laterality: Right

## 2021-01-17 MED ORDER — FENTANYL CITRATE (PF) 100 MCG/2ML IJ SOLN
INTRAMUSCULAR | Status: DC | PRN
  Administered 2021-01-17: 50 ug via INTRAVENOUS

## 2021-01-17 MED ORDER — ARMC OPHTHALMIC DILATING DROPS
1.0000 "application " | OPHTHALMIC | Status: DC | PRN
  Administered 2021-01-17 (×3): 1 via OPHTHALMIC

## 2021-01-17 MED ORDER — LACTATED RINGERS IV SOLN: INTRAVENOUS | Status: DC

## 2021-01-17 MED ORDER — LIDOCAINE HCL (PF) 2 % IJ SOLN
INTRAOCULAR | Status: DC | PRN
  Administered 2021-01-17: 1 mL

## 2021-01-17 MED ORDER — NA HYALUR & NA CHOND-NA HYALUR 0.4-0.35 ML IO KIT
PACK | INTRAOCULAR | Status: DC | PRN
  Administered 2021-01-17: 1 mL via INTRAOCULAR

## 2021-01-17 MED ORDER — EPINEPHRINE PF 1 MG/ML IJ SOLN
INTRAOCULAR | Status: DC | PRN
  Administered 2021-01-17: 78 mL via OPHTHALMIC

## 2021-01-17 MED ORDER — CEFUROXIME OPHTHALMIC INJECTION 1 MG/0.1 ML
INJECTION | OPHTHALMIC | Status: DC | PRN
  Administered 2021-01-17: 0.1 mL via INTRACAMERAL

## 2021-01-17 MED ORDER — TETRACAINE HCL 0.5 % OP SOLN
1.0000 [drp] | OPHTHALMIC | Status: DC | PRN
  Administered 2021-01-17 (×3): 1 [drp] via OPHTHALMIC

## 2021-01-17 MED ORDER — BRIMONIDINE TARTRATE-TIMOLOL 0.2-0.5 % OP SOLN
OPHTHALMIC | Status: DC | PRN
  Administered 2021-01-17: 1 [drp] via OPHTHALMIC

## 2021-01-17 SURGICAL SUPPLY — 22 items
CANNULA ANT/CHMB 27G (MISCELLANEOUS) ×1 IMPLANT
CANNULA ANT/CHMB 27GA (MISCELLANEOUS) ×2 IMPLANT
GLOVE SURG LX 7.5 STRW (GLOVE) ×1
GLOVE SURG LX STRL 7.5 STRW (GLOVE) ×1 IMPLANT
GLOVE SURG TRIUMPH 8.0 PF LTX (GLOVE) ×2 IMPLANT
GOWN STRL REUS W/ TWL LRG LVL3 (GOWN DISPOSABLE) ×2 IMPLANT
GOWN STRL REUS W/TWL LRG LVL3 (GOWN DISPOSABLE) ×4
LENS IOL TECNIS EYHANCE 23.0 (Intraocular Lens) ×1 IMPLANT
MARKER SKIN DUAL TIP RULER LAB (MISCELLANEOUS) ×2 IMPLANT
NDL CAPSULORHEX 25GA (NEEDLE) ×1 IMPLANT
NDL FILTER BLUNT 18X1 1/2 (NEEDLE) ×2 IMPLANT
NEEDLE CAPSULORHEX 25GA (NEEDLE) ×2 IMPLANT
NEEDLE FILTER BLUNT 18X 1/2SAF (NEEDLE) ×2
NEEDLE FILTER BLUNT 18X1 1/2 (NEEDLE) ×2 IMPLANT
PACK CATARACT BRASINGTON (MISCELLANEOUS) ×2 IMPLANT
PACK EYE AFTER SURG (MISCELLANEOUS) ×2 IMPLANT
PACK OPTHALMIC (MISCELLANEOUS) ×2 IMPLANT
SOLUTION OPHTHALMIC SALT (MISCELLANEOUS) ×2 IMPLANT
SYR 3ML LL SCALE MARK (SYRINGE) ×4 IMPLANT
SYR TB 1ML LUER SLIP (SYRINGE) ×2 IMPLANT
WATER STERILE IRR 250ML POUR (IV SOLUTION) ×2 IMPLANT
WIPE NON LINTING 3.25X3.25 (MISCELLANEOUS) ×2 IMPLANT

## 2021-01-17 NOTE — Op Note (Signed)
LOCATION:  [facility]   PREOPERATIVE DIAGNOSIS:    Nuclear sclerotic cataract right eye. H25.11   POSTOPERATIVE DIAGNOSIS:  Nuclear sclerotic cataract right eye with loose zonules.    PROCEDURE:  Phacoemusification with posterior chamber intraocular lens placement of the right eye   ULTRASOUND TIME: Procedure(s) with comments: CATARACT EXTRACTION PHACO AND INTRAOCULAR LENS PLACEMENT (IOC) RIGHT 14.31 01:33.0 15.4%  (Right) - Diabetic - no meds  LENS:   Implant Name Type Inv. Item Serial No. Manufacturer Lot No. LRB No. Used Action  LENS IOL TECNIS EYHANCE 23.0 - [id number] Intraocular Lens LENS IOL TECNIS EYHANCE 23.0 [id number] JOHNSON   Right 1 Implanted         SURGEON:  Wyonia Hough, MD   ANESTHESIA:  Topical with tetracaine drops and 2% Xylocaine jelly, augmented with 1% preservative-free intracameral lidocaine.    COMPLICATIONS:  None.   DESCRIPTION OF PROCEDURE:  The patient was identified in the holding room and transported to the operating room and placed in the supine position under the operating microscope.  The right eye was identified as the operative eye and it was prepped and draped in the usual sterile ophthalmic fashion.   A 1 millimeter clear-corneal paracentesis was made at the 12:00 position.  0.5 ml of preservative-free 1% lidocaine was injected into the anterior chamber. The anterior chamber was filled with Viscoat viscoelastic.  A 2.4 millimeter keratome was used to make a near-clear corneal incision at the 9:00 position.  A curvilinear capsulorrhexis was made with a cystotome and capsulorrhexis forceps.  Balanced salt solution was used to hydrodissect and hydrodelineate the nucleus.   Phacoemulsification was then used in stop and chop fashion to remove the lens nucleus and epinucleus.  The remaining cortex was then removed using the irrigation and aspiration handpiece. Loose zonules were noted uniformly.  Provisc was then placed into the  capsular bag to distend it for lens placement.  A lens was then injected into the capsular bag.  The remaining viscoelastic was aspirated.   Wounds were hydrated with balanced salt solution.  The anterior chamber was inflated to a physiologic pressure with balanced salt solution.  No wound leaks were noted. Cefuroxime 0.1 ml of a 10mg /ml solution was injected into the anterior chamber for a dose of 1 mg of intracameral antibiotic at the completion of the case.   Timolol and Brimonidine drops were applied to the eye.  The patient was taken to the recovery room in stable condition without complications of anesthesia or surgery.   Jariyah Hackley 01/17/2021, 8:02 AM

## 2021-01-17 NOTE — Anesthesia Preprocedure Evaluation (Signed)
Anesthesia Evaluation  Patient identified by MRN, date of birth, ID band Patient awake    Reviewed: Allergy & Precautions, NPO status , Patient's Chart, lab work & pertinent test results  Airway Mallampati: II  TM Distance: >3 FB Neck ROM: Full    Dental  (+) Upper Dentures, Lower Dentures   Pulmonary neg pulmonary ROS,    Pulmonary exam normal        Cardiovascular negative cardio ROS Normal cardiovascular exam     Neuro/Psych CVA negative psych ROS   GI/Hepatic Neg liver ROS, GERD  Controlled and Medicated,  Endo/Other  diabetes, Type 2  Renal/GU CRFRenal disease     Musculoskeletal negative musculoskeletal ROS (+)   Abdominal Normal abdominal exam  (+)   Peds  Hematology  (+) anemia , Multiple myeloma   Anesthesia Other Findings   Reproductive/Obstetrics                             Anesthesia Physical Anesthesia Plan  ASA: III  Anesthesia Plan: MAC   Post-op Pain Management:    Induction: Intravenous  PONV Risk Score and Plan: 2 and TIVA, Midazolam and Treatment may vary due to age or medical condition  Airway Management Planned: Natural Airway and Nasal Cannula  Additional Equipment: None  Intra-op Plan:   Post-operative Plan:   Informed Consent: I have reviewed the patients History and Physical, chart, labs and discussed the procedure including the risks, benefits and alternatives for the proposed anesthesia with the patient or authorized representative who has indicated his/her understanding and acceptance.     Dental advisory given  Plan Discussed with: CRNA  Anesthesia Plan Comments:         Anesthesia Quick Evaluation

## 2021-01-17 NOTE — Anesthesia Postprocedure Evaluation (Signed)
Anesthesia Post Note  Patient: Carrie Craig  Procedure(s) Performed: CATARACT EXTRACTION PHACO AND INTRAOCULAR LENS PLACEMENT (IOC) RIGHT 14.31 01:33.0 15.4%  (Right Eye)     Patient location during evaluation: PACU Anesthesia Type: MAC Level of consciousness: awake and alert Pain management: pain level controlled Vital Signs Assessment: post-procedure vital signs reviewed and stable Respiratory status: spontaneous breathing and nonlabored ventilation Cardiovascular status: blood pressure returned to baseline Postop Assessment: no apparent nausea or vomiting Anesthetic complications: no   No complications documented.  Lilyrose Tanney [employer]

## 2021-01-17 NOTE — Anesthesia Procedure Notes (Signed)
Procedure Name: MAC Date/Time: 01/17/2021 7:45 AM Performed by: Silvana Newness, CRNA Pre-anesthesia Checklist: Patient identified, Emergency Drugs available, Suction available, Patient being monitored and Timeout performed Patient Re-evaluated:Patient Re-evaluated prior to induction Oxygen Delivery Method: Nasal cannula Placement Confirmation: positive ETCO2

## 2021-01-17 NOTE — Transfer of Care (Signed)
Immediate Anesthesia Transfer of Care Note  Patient: Carrie Craig  Procedure(s) Performed: CATARACT EXTRACTION PHACO AND INTRAOCULAR LENS PLACEMENT (IOC) RIGHT 14.31 01:33.0 15.4%  (Right Eye)  Patient Location: PACU  Anesthesia Type: MAC  Level of Consciousness: awake, alert  and patient cooperative  Airway and Oxygen Therapy: Patient Spontanous Breathing and Patient connected to supplemental oxygen  Post-op Assessment: Post-op Vital signs reviewed, Patient's Cardiovascular Status Stable, Respiratory Function Stable, Patent Airway and No signs of Nausea or vomiting  Post-op Vital Signs: Reviewed and stable  Complications: No complications documented.

## 2021-01-17 NOTE — H&P (Signed)

## 2021-02-02 ENCOUNTER — Encounter: Payer: Self-pay | Admitting: Ophthalmology

## 2021-02-02 ENCOUNTER — Other Ambulatory Visit: Payer: Self-pay

## 2021-02-02 DIAGNOSIS — H2512 Age-related nuclear cataract, left eye: Secondary | ICD-10-CM | POA: Diagnosis not present

## 2021-02-07 ENCOUNTER — Other Ambulatory Visit
Admission: RE | Admit: 2021-02-07 | Discharge: 2021-02-07 | Disposition: A | Discharge status: Home / Self Care | Payer: Medicare HMO | Source: Ambulatory Visit | Attending: Ophthalmology | Admitting: Ophthalmology

## 2021-02-07 ENCOUNTER — Other Ambulatory Visit: Payer: Self-pay

## 2021-02-07 DIAGNOSIS — Z01812 Encounter for preprocedural laboratory examination: Secondary | ICD-10-CM | POA: Insufficient documentation

## 2021-02-07 DIAGNOSIS — Z20822 Contact with and (suspected) exposure to covid-19: Secondary | ICD-10-CM | POA: Diagnosis not present

## 2021-02-07 LAB — SARS CORONAVIRUS 2 (TAT 6-24 HRS): SARS Coronavirus 2: NEGATIVE

## 2021-02-07 NOTE — Discharge Instructions (Signed)

## 2021-02-09 ENCOUNTER — Encounter: Payer: Self-pay | Admitting: Ophthalmology

## 2021-02-09 ENCOUNTER — Other Ambulatory Visit: Payer: Self-pay

## 2021-02-09 ENCOUNTER — Encounter
Admission: RE | Disposition: A | Discharge status: Home / Self Care | Payer: Self-pay | Source: Home / Self Care | Attending: Ophthalmology

## 2021-02-09 ENCOUNTER — Ambulatory Visit: Payer: Medicare HMO | Admitting: Anesthesiology

## 2021-02-09 ENCOUNTER — Ambulatory Visit
Admission: RE | Admit: 2021-02-09 | Discharge: 2021-02-09 | Disposition: A | Discharge status: Home / Self Care | Payer: Medicare HMO | Attending: Ophthalmology | Admitting: Ophthalmology

## 2021-02-09 DIAGNOSIS — H25812 Combined forms of age-related cataract, left eye: Secondary | ICD-10-CM | POA: Diagnosis not present

## 2021-02-09 DIAGNOSIS — H2512 Age-related nuclear cataract, left eye: Secondary | ICD-10-CM | POA: Insufficient documentation

## 2021-02-09 DIAGNOSIS — Z7902 Long term (current) use of antithrombotics/antiplatelets: Secondary | ICD-10-CM | POA: Diagnosis not present

## 2021-02-09 DIAGNOSIS — Z7983 Long term (current) use of bisphosphonates: Secondary | ICD-10-CM | POA: Diagnosis not present

## 2021-02-09 DIAGNOSIS — E1136 Type 2 diabetes mellitus with diabetic cataract: Secondary | ICD-10-CM | POA: Diagnosis not present

## 2021-02-09 DIAGNOSIS — Z79899 Other long term (current) drug therapy: Secondary | ICD-10-CM | POA: Diagnosis not present

## 2021-02-09 DIAGNOSIS — Z7982 Long term (current) use of aspirin: Secondary | ICD-10-CM | POA: Insufficient documentation

## 2021-02-09 HISTORY — PX: CATARACT EXTRACTION W/PHACO: SHX586

## 2021-02-09 SURGERY — PHACOEMULSIFICATION, CATARACT, WITH IOL INSERTION
Anesthesia: Monitor Anesthesia Care | Site: Eye | Laterality: Left

## 2021-02-09 MED ORDER — LACTATED RINGERS IV SOLN: INTRAVENOUS | Status: DC

## 2021-02-09 MED ORDER — ACETAMINOPHEN 325 MG PO TABS: 325.0000 mg | ORAL_TABLET | ORAL | Status: DC | PRN

## 2021-02-09 MED ORDER — NA HYALUR & NA CHOND-NA HYALUR 0.4-0.35 ML IO KIT
PACK | INTRAOCULAR | Status: DC | PRN
  Administered 2021-02-09: 1 mL via INTRAOCULAR

## 2021-02-09 MED ORDER — CYCLOPENTOLATE HCL 2 % OP SOLN
1.0000 [drp] | OPHTHALMIC | Status: DC | PRN
  Administered 2021-02-09 (×3): 1 [drp] via OPHTHALMIC

## 2021-02-09 MED ORDER — FENTANYL CITRATE (PF) 100 MCG/2ML IJ SOLN
INTRAMUSCULAR | Status: DC | PRN
  Administered 2021-02-09: 50 ug via INTRAVENOUS

## 2021-02-09 MED ORDER — TETRACAINE HCL 0.5 % OP SOLN
1.0000 [drp] | OPHTHALMIC | Status: DC | PRN
  Administered 2021-02-09 (×3): 1 [drp] via OPHTHALMIC

## 2021-02-09 MED ORDER — PHENYLEPHRINE HCL 10 % OP SOLN
1.0000 [drp] | OPHTHALMIC | Status: DC | PRN
  Administered 2021-02-09 (×3): 1 [drp] via OPHTHALMIC

## 2021-02-09 MED ORDER — EPINEPHRINE PF 1 MG/ML IJ SOLN
INTRAOCULAR | Status: DC | PRN
  Administered 2021-02-09: 82 mL via OPHTHALMIC

## 2021-02-09 MED ORDER — CEFUROXIME OPHTHALMIC INJECTION 1 MG/0.1 ML
INJECTION | OPHTHALMIC | Status: DC | PRN
  Administered 2021-02-09: 0.1 mL via INTRACAMERAL

## 2021-02-09 MED ORDER — LIDOCAINE HCL (PF) 2 % IJ SOLN
INTRAOCULAR | Status: DC | PRN
  Administered 2021-02-09: 1 mL

## 2021-02-09 MED ORDER — ACETAMINOPHEN 160 MG/5ML PO SOLN: 325.0000 mg | ORAL | Status: DC | PRN

## 2021-02-09 MED ORDER — BRIMONIDINE TARTRATE-TIMOLOL 0.2-0.5 % OP SOLN
OPHTHALMIC | Status: DC | PRN
  Administered 2021-02-09: 1 [drp] via OPHTHALMIC

## 2021-02-09 SURGICAL SUPPLY — 19 items
CANNULA ANT/CHMB 27GA (MISCELLANEOUS) ×2 IMPLANT
GLOVE SURG LX 7.5 STRW (GLOVE) ×1
GLOVE SURG LX STRL 7.5 STRW (GLOVE) ×1 IMPLANT
GLOVE SURG TRIUMPH 8.0 PF LTX (GLOVE) ×2 IMPLANT
GOWN STRL REUS W/ TWL LRG LVL3 (GOWN DISPOSABLE) ×2 IMPLANT
GOWN STRL REUS W/TWL LRG LVL3 (GOWN DISPOSABLE) ×4
LENS IOL TECNIS EYHANCE 22.5 (Intraocular Lens) ×2 IMPLANT
MARKER SKIN DUAL TIP RULER LAB (MISCELLANEOUS) ×2 IMPLANT
NEEDLE CAPSULORHEX 25GA (NEEDLE) ×2 IMPLANT
NEEDLE FILTER BLUNT 18X 1/2SAF (NEEDLE) ×2
NEEDLE FILTER BLUNT 18X1 1/2 (NEEDLE) ×2 IMPLANT
PACK CATARACT BRASINGTON (MISCELLANEOUS) ×2 IMPLANT
PACK EYE AFTER SURG (MISCELLANEOUS) ×2 IMPLANT
PACK OPTHALMIC (MISCELLANEOUS) ×2 IMPLANT
SOLUTION OPHTHALMIC SALT (MISCELLANEOUS) ×2 IMPLANT
SYR 3ML LL SCALE MARK (SYRINGE) ×4 IMPLANT
SYR TB 1ML LUER SLIP (SYRINGE) ×2 IMPLANT
WATER STERILE IRR 250ML POUR (IV SOLUTION) ×2 IMPLANT
WIPE NON LINTING 3.25X3.25 (MISCELLANEOUS) ×2 IMPLANT

## 2021-02-09 NOTE — Transfer of Care (Signed)
Immediate Anesthesia Transfer of Care Note  Patient: Carrie Craig  Procedure(s) Performed: CATARACT EXTRACTION PHACO AND INTRAOCULAR LENS PLACEMENT (IOC) LEFT 15.6 01:47.4 14.5% (Left Eye)  Patient Location: PACU  Anesthesia Type: MAC  Level of Consciousness: awake, alert  and patient cooperative  Airway and Oxygen Therapy: Patient Spontanous Breathing and Patient connected to supplemental oxygen  Post-op Assessment: Post-op Vital signs reviewed, Patient's Cardiovascular Status Stable, Respiratory Function Stable, Patent Airway and No signs of Nausea or vomiting  Post-op Vital Signs: Reviewed and stable  Complications: No complications documented.

## 2021-02-09 NOTE — Anesthesia Preprocedure Evaluation (Signed)
Anesthesia Evaluation  Patient identified by MRN, date of birth, ID band Patient awake    Reviewed: Allergy & Precautions, H&P , NPO status , Patient's Chart, lab work & pertinent test results, reviewed documented beta blocker date and time   Airway Mallampati: II  TM Distance: >3 FB Neck ROM: full    Dental no notable dental hx.    Pulmonary neg pulmonary ROS,    Pulmonary exam normal breath sounds clear to auscultation       Cardiovascular Exercise Tolerance: Good hypertension, Normal cardiovascular exam Rhythm:regular Rate:Normal     Neuro/Psych CVA negative psych ROS   GI/Hepatic Neg liver ROS, GERD  ,  Endo/Other  diabetes, Type 2Diet controlled DM  Renal/GU negative Renal ROS  negative genitourinary   Musculoskeletal   Abdominal   Peds  Hematology  (+) Blood dyscrasia, anemia , Multiple Myeloma   Anesthesia Other Findings   Reproductive/Obstetrics negative OB ROS                             Anesthesia Physical Anesthesia Plan  ASA: III  Anesthesia Plan: MAC   Post-op Pain Management:    Induction:   PONV Risk Score and Plan:   Airway Management Planned:   Additional Equipment:   Intra-op Plan:   Post-operative Plan:   Informed Consent: I have reviewed the patients History and Physical, chart, labs and discussed the procedure including the risks, benefits and alternatives for the proposed anesthesia with the patient or authorized representative who has indicated his/her understanding and acceptance.     Dental Advisory Given  Plan Discussed with: CRNA and Anesthesiologist  Anesthesia Plan Comments:         Anesthesia Quick Evaluation

## 2021-02-09 NOTE — Anesthesia Procedure Notes (Signed)
Procedure Name: MAC Date/Time: 02/09/2021 8:52 AM Performed by: Silvana Newness, CRNA Pre-anesthesia Checklist: Patient identified, Emergency Drugs available, Suction available, Patient being monitored and Timeout performed Patient Re-evaluated:Patient Re-evaluated prior to induction Oxygen Delivery Method: Nasal cannula Placement Confirmation: positive ETCO2

## 2021-02-09 NOTE — Op Note (Signed)
OPERATIVE NOTE  Carrie Craig [id number] 02/09/2021   PREOPERATIVE DIAGNOSIS:  Nuclear sclerotic cataract left eye. H25.12   POSTOPERATIVE DIAGNOSIS:    Nuclear sclerotic cataract left eye.     PROCEDURE:  Phacoemusification with posterior chamber intraocular lens placement of the left eye  Ultrasound time: Procedure(s) with comments: CATARACT EXTRACTION PHACO AND INTRAOCULAR LENS PLACEMENT (IOC) LEFT 15.6 01:47.4 14.5% (Left) - Diabetic - oral meds  LENS:   Implant Name Type Inv. Item Serial No. Manufacturer Lot No. LRB No. Used Action  LENS IOL TECNIS EYHANCE 22.5 - [id number] Intraocular Lens LENS IOL TECNIS EYHANCE 22.5 [id number] JOHNSON   Left 1 Implanted      SURGEON:  Wyonia Hough, MD   ANESTHESIA:  Topical with tetracaine drops and 2% Xylocaine jelly, augmented with 1% preservative-free intracameral lidocaine.    COMPLICATIONS:  None.   DESCRIPTION OF PROCEDURE:  The patient was identified in the holding room and transported to the operating room and placed in the supine position under the operating microscope.  The left eye was identified as the operative eye and it was prepped and draped in the usual sterile ophthalmic fashion.   A 1 millimeter clear-corneal paracentesis was made at the 1:30 position.  0.5 ml of preservative-free 1% lidocaine was injected into the anterior chamber.  The anterior chamber was filled with Viscoat viscoelastic.  A 2.4 millimeter keratome was used to make a near-clear corneal incision at the 10:30 position.  .  A curvilinear capsulorrhexis was made with a cystotome and capsulorrhexis forceps.  Balanced salt solution was used to hydrodissect and hydrodelineate the nucleus.   Phacoemulsification was then used in stop and chop fashion to remove the lens nucleus and epinucleus.  The remaining cortex was then removed using the irrigation and aspiration handpiece. Loose zonules were noted during phaco and aspiration. The capsule remained  centered and no focal areas of zonular loss were noted.  Provisc was then placed into the capsular bag to distend it for lens placement.  A lens was then injected into the capsular bag.  The remaining viscoelastic was aspirated.   Wounds were hydrated with balanced salt solution.  The anterior chamber was inflated to a physiologic pressure with balanced salt solution.  No wound leaks were noted. Cefuroxime 0.1 ml of a 10mg /ml solution was injected into the anterior chamber for a dose of 1 mg of intracameral antibiotic at the completion of the case.   Timolol and Brimonidine drops were applied to the eye.  The patient was taken to the recovery room in stable condition without complications of anesthesia or surgery.  [name] 02/09/2021, 9:10 AM

## 2021-02-09 NOTE — H&P (Signed)
[facility]   Primary Care Physician:  Dion Body, MD Ophthalmologist: Dr. Leandrew Koyanagi  Pre-Procedure History & Physical: HPI:  Carrie Craig is a 87 y.o. female here for ophthalmic surgery.   Past Medical History:  Diagnosis Date  . Cancer ([place])    multiple myeloma  . Diabetes mellitus without complication (HCC)    diet controlled  . GERD (gastroesophageal reflux disease)   . Stroke ([place])   . Wears dentures    full upper and lower    Past Surgical History:  Procedure Laterality Date  . CATARACT EXTRACTION W/PHACO Right 01/17/2021   Procedure: CATARACT EXTRACTION PHACO AND INTRAOCULAR LENS PLACEMENT (IOC) RIGHT 14.31 01:33.0 15.4% ;  Surgeon: Leandrew Koyanagi, MD;  Location: [place];  Service: Ophthalmology;  Laterality: Right;  Diabetic - no meds    Prior to Admission medications   Medication Sig Start Date End Date Taking? Authorizing Provider  albuterol (VENTOLIN HFA) 108 (90 Base) MCG/ACT inhaler Inhale 2 puffs into the lungs every 6 (six) hours as needed for wheezing or shortness of breath.   Yes [provider]  alendronate (FOSAMAX) 70 MG tablet Take 70 mg by mouth every Monday. Take with a full glass of water on an empty stomach.   Yes [provider]  amLODipine (NORVASC) 5 MG tablet Take 5 mg by mouth daily.   Yes [provider]  aspirin EC 81 MG tablet Take 81 mg by mouth daily.   Yes [provider]  atorvastatin (LIPITOR) 40 MG tablet Take 40 mg by mouth at bedtime.   Yes [provider]  Calcium Carbonate-Vitamin D3 600-400 MG-UNIT TABS Take 1 tablet by mouth 2 (two) times daily with a meal.   Yes [provider]  clopidogrel (PLAVIX) 75 MG tablet Take 75 mg by mouth daily.   Yes [provider]  ferrous sulfate 325 (65 FE) MG tablet Take by mouth.   Yes [provider]  loratadine (CLARITIN) 10 MG tablet Take 10 mg by mouth daily.   Yes [provider]  losartan (COZAAR) 25 MG tablet Take 25 mg by mouth daily.   Yes [provider]  metoprolol succinate (TOPROL-XL) 25 MG 24 hr tablet Take 25 mg by mouth daily.   Yes [provider]  pantoprazole (PROTONIX) 40 MG tablet Take 40 mg by mouth daily.   Yes [provider]  torsemide (DEMADEX) 20 MG tablet Take 1 tablet by mouth daily as needed. 11/11/18  Yes [provider]  traZODone (DESYREL) 50 MG tablet Take 50 mg by mouth at bedtime as needed for sleep.   Yes [provider]  glipiZIDE (GLUCOTROL) 5 MG tablet Take by mouth daily before breakfast. Patient not taking: Reported on 01/11/2021    [provider]  tiZANidine (ZANAFLEX) 2 MG tablet Take 2 mg by mouth 3 (three) times daily as needed for muscle spasms. Patient not taking: Reported on 01/11/2021    [provider]  traMADol (ULTRAM) 50 MG tablet Take 50 mg by mouth 3 (three) times daily as needed for moderate pain.  Patient not taking: Reported on 01/11/2021    [provider]    Allergies as of 12/13/2020  . (No Known Allergies)    History reviewed. No pertinent family history.  Social History   Socioeconomic History  . Marital status: Widowed    Spouse name: Not on file  . Number of children: Not on file  . Years of education: Not  on file  . Highest education level: Not on file  Occupational History  . Not on file  Tobacco Use  . Smoking status: Never Smoker  . Smokeless tobacco: Never Used  Vaping Use  . Vaping Use: Never used  Substance and Sexual Activity  . Alcohol use: No  . Drug use: No  . Sexual activity: Not on file  Other Topics Concern  . Not on file  Social History Narrative   used to live in [place] [until 1 year]; lives in [facility]. 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 Uses walker.    Social Determinants of Health   Financial Resource Strain: Not on file  Food Insecurity: Not on file  Transportation  Needs: Not on file  Physical Activity: Not on file  Stress: Not on file  Social Connections: Not on file  Intimate Partner Violence: Not on file    Review of Systems: See HPI, otherwise negative ROS  Physical Exam: BP (!) 169/80   Pulse 85   Temp 97.8 F (36.6 C)   Ht 5' (1.524 m)   Wt 54.9 kg   SpO2 95%   BMI 23.63 kg/m  General:   Alert,  pleasant and cooperative in NAD Head:  Normocephalic and atraumatic. Lungs:  Clear to auscultation.    Heart:  Regular rate and rhythm.   Impression/Plan: Carrie Craig is here for ophthalmic surgery.  Risks, benefits, limitations, and alternatives regarding ophthalmic surgery have been reviewed with the patient.  Questions have been answered.  All parties agreeable.   Leandrew Koyanagi, MD  02/09/2021, 8:13 AM

## 2021-02-09 NOTE — Anesthesia Postprocedure Evaluation (Signed)
Anesthesia Post Note  Patient: Carrie Craig  Procedure(s) Performed: CATARACT EXTRACTION PHACO AND INTRAOCULAR LENS PLACEMENT (IOC) LEFT 15.6 01:47.4 14.5% (Left Eye)     Patient location during evaluation: PACU Anesthesia Type: MAC Level of consciousness: awake and alert Pain management: pain level controlled Vital Signs Assessment: post-procedure vital signs reviewed and stable Respiratory status: spontaneous breathing, nonlabored ventilation, respiratory function stable and patient connected to nasal cannula oxygen Cardiovascular status: stable and blood pressure returned to baseline Postop Assessment: no apparent nausea or vomiting Anesthetic complications: no   No complications documented.  Trecia Rogers

## 2021-02-10 ENCOUNTER — Encounter: Payer: Self-pay | Admitting: Ophthalmology

## 2021-04-13 DIAGNOSIS — C9000 Multiple myeloma not having achieved remission: Secondary | ICD-10-CM | POA: Diagnosis not present

## 2021-04-13 DIAGNOSIS — R0600 Dyspnea, unspecified: Secondary | ICD-10-CM | POA: Diagnosis not present

## 2021-04-13 DIAGNOSIS — R0689 Other abnormalities of breathing: Secondary | ICD-10-CM | POA: Diagnosis not present

## 2021-04-13 DIAGNOSIS — J449 Chronic obstructive pulmonary disease, unspecified: Secondary | ICD-10-CM | POA: Diagnosis not present

## 2021-04-27 DIAGNOSIS — Z23 Encounter for immunization: Secondary | ICD-10-CM | POA: Diagnosis not present

## 2021-05-04 DIAGNOSIS — E1129 Type 2 diabetes mellitus with other diabetic kidney complication: Secondary | ICD-10-CM | POA: Diagnosis not present

## 2021-05-04 DIAGNOSIS — I10 Essential (primary) hypertension: Secondary | ICD-10-CM | POA: Diagnosis not present

## 2021-05-04 DIAGNOSIS — N185 Chronic kidney disease, stage 5: Secondary | ICD-10-CM | POA: Diagnosis not present

## 2021-05-04 DIAGNOSIS — R600 Localized edema: Secondary | ICD-10-CM | POA: Diagnosis not present

## 2021-07-06 DIAGNOSIS — N185 Chronic kidney disease, stage 5: Secondary | ICD-10-CM | POA: Diagnosis not present

## 2021-07-06 DIAGNOSIS — I10 Essential (primary) hypertension: Secondary | ICD-10-CM | POA: Diagnosis not present

## 2021-08-25 DIAGNOSIS — Z23 Encounter for immunization: Secondary | ICD-10-CM | POA: Diagnosis not present

## 2021-09-01 DIAGNOSIS — N184 Chronic kidney disease, stage 4 (severe): Secondary | ICD-10-CM | POA: Diagnosis not present

## 2021-09-01 DIAGNOSIS — E782 Mixed hyperlipidemia: Secondary | ICD-10-CM | POA: Diagnosis not present

## 2021-09-01 DIAGNOSIS — I10 Essential (primary) hypertension: Secondary | ICD-10-CM | POA: Diagnosis not present

## 2021-09-01 DIAGNOSIS — Z1389 Encounter for screening for other disorder: Secondary | ICD-10-CM | POA: Diagnosis not present

## 2021-09-01 DIAGNOSIS — Z0000 Encounter for general adult medical examination without abnormal findings: Secondary | ICD-10-CM | POA: Diagnosis not present

## 2021-09-01 DIAGNOSIS — C9000 Multiple myeloma not having achieved remission: Secondary | ICD-10-CM | POA: Diagnosis not present

## 2021-09-01 DIAGNOSIS — Z8673 Personal history of transient ischemic attack (TIA), and cerebral infarction without residual deficits: Secondary | ICD-10-CM | POA: Diagnosis not present

## 2021-09-14 ENCOUNTER — Emergency Department
Admission: EM | Admit: 2021-09-14 | Discharge: 2021-09-14 | Disposition: A | Discharge status: Home / Self Care | Payer: Medicare HMO | Attending: Emergency Medicine | Admitting: Emergency Medicine

## 2021-09-14 ENCOUNTER — Emergency Department: Payer: Medicare HMO

## 2021-09-14 ENCOUNTER — Other Ambulatory Visit: Payer: Self-pay

## 2021-09-14 DIAGNOSIS — J209 Acute bronchitis, unspecified: Secondary | ICD-10-CM | POA: Insufficient documentation

## 2021-09-14 DIAGNOSIS — Z20822 Contact with and (suspected) exposure to covid-19: Secondary | ICD-10-CM | POA: Diagnosis not present

## 2021-09-14 DIAGNOSIS — R0602 Shortness of breath: Secondary | ICD-10-CM | POA: Diagnosis not present

## 2021-09-14 DIAGNOSIS — E119 Type 2 diabetes mellitus without complications: Secondary | ICD-10-CM | POA: Diagnosis not present

## 2021-09-14 DIAGNOSIS — Z7982 Long term (current) use of aspirin: Secondary | ICD-10-CM | POA: Insufficient documentation

## 2021-09-14 DIAGNOSIS — I10 Essential (primary) hypertension: Secondary | ICD-10-CM | POA: Diagnosis not present

## 2021-09-14 DIAGNOSIS — Z7984 Long term (current) use of oral hypoglycemic drugs: Secondary | ICD-10-CM | POA: Insufficient documentation

## 2021-09-14 DIAGNOSIS — R059 Cough, unspecified: Secondary | ICD-10-CM | POA: Diagnosis not present

## 2021-09-14 DIAGNOSIS — H1032 Unspecified acute conjunctivitis, left eye: Secondary | ICD-10-CM | POA: Insufficient documentation

## 2021-09-14 DIAGNOSIS — R069 Unspecified abnormalities of breathing: Secondary | ICD-10-CM | POA: Diagnosis not present

## 2021-09-14 DIAGNOSIS — J40 Bronchitis, not specified as acute or chronic: Secondary | ICD-10-CM

## 2021-09-14 DIAGNOSIS — R0902 Hypoxemia: Secondary | ICD-10-CM | POA: Diagnosis not present

## 2021-09-14 DIAGNOSIS — I700 Atherosclerosis of aorta: Secondary | ICD-10-CM | POA: Diagnosis not present

## 2021-09-14 LAB — CBC WITH DIFFERENTIAL/PLATELET
Abs Immature Granulocytes: 0.03 10*3/uL (ref 0.00–0.07)
Basophils Absolute: 0.1 10*3/uL (ref 0.0–0.1)
Basophils Relative: 1 %
Eosinophils Absolute: 0.1 10*3/uL (ref 0.0–0.5)
Eosinophils Relative: 1 %
HCT: 30.7 % — ABNORMAL LOW (ref 36.0–46.0)
Hemoglobin: 10.9 g/dL — ABNORMAL LOW (ref 12.0–15.0)
Immature Granulocytes: 0 %
Lymphocytes Relative: 7 %
Lymphs Abs: 0.6 10*3/uL — ABNORMAL LOW (ref 0.7–4.0)
MCH: 32 pg (ref 26.0–34.0)
MCHC: 35.5 g/dL (ref 30.0–36.0)
MCV: 90 fL (ref 80.0–100.0)
Monocytes Absolute: 0.6 10*3/uL (ref 0.1–1.0)
Monocytes Relative: 7 %
Neutro Abs: 7.3 10*3/uL (ref 1.7–7.7)
Neutrophils Relative %: 84 %
Platelets: 195 10*3/uL (ref 150–400)
RBC: 3.41 MIL/uL — ABNORMAL LOW (ref 3.87–5.11)
RDW: 13.2 % (ref 11.5–15.5)
WBC: 8.8 10*3/uL (ref 4.0–10.5)
nRBC: 0 % (ref 0.0–0.2)

## 2021-09-14 LAB — TROPONIN I (HIGH SENSITIVITY)
Troponin I (High Sensitivity): 17 ng/L (ref <18)
Troponin I (High Sensitivity): 17 ng/L (ref <18)

## 2021-09-14 LAB — RESP PANEL BY RT-PCR (FLU A&B, COVID) ARPGX2
Influenza A by PCR: NEGATIVE
Influenza B by PCR: NEGATIVE
SARS Coronavirus 2 by RT PCR: NEGATIVE

## 2021-09-14 LAB — COMPREHENSIVE METABOLIC PANEL
ALT: 7 U/L (ref 0–44)
AST: 11 U/L — ABNORMAL LOW (ref 15–41)
Albumin: 3.4 g/dL — ABNORMAL LOW (ref 3.5–5.0)
Alkaline Phosphatase: 49 U/L (ref 38–126)
Anion gap: 11 (ref 5–15)
BUN: 46 mg/dL — ABNORMAL HIGH (ref 8–23)
CO2: 21 mmol/L — ABNORMAL LOW (ref 22–32)
Calcium: 8.8 mg/dL — ABNORMAL LOW (ref 8.9–10.3)
Chloride: 104 mmol/L (ref 98–111)
Creatinine, Ser: 4.56 mg/dL — ABNORMAL HIGH (ref 0.44–1.00)
GFR, Estimated: 9 mL/min — ABNORMAL LOW (ref >60)
Glucose, Bld: 136 mg/dL — ABNORMAL HIGH (ref 70–99)
Potassium: 3.4 mmol/L — ABNORMAL LOW (ref 3.5–5.1)
Sodium: 136 mmol/L (ref 135–145)
Total Bilirubin: 0.9 mg/dL (ref 0.3–1.2)
Total Protein: 7.7 g/dL (ref 6.5–8.1)

## 2021-09-14 LAB — BRAIN NATRIURETIC PEPTIDE: B Natriuretic Peptide: 374.8 pg/mL — ABNORMAL HIGH (ref 0.0–100.0)

## 2021-09-14 MED ORDER — GUAIFENESIN ER 600 MG PO TB12: 600.0000 mg | ORAL_TABLET | Freq: Two times a day (BID) | ORAL | 0 refills | Status: DC

## 2021-09-14 MED ORDER — SULFACETAMIDE SODIUM 10 % OP SOLN
1.0000 [drp] | Freq: Once | OPHTHALMIC | Status: AC
  Administered 2021-09-14: 1 [drp] via OPHTHALMIC
  Filled 2021-09-14: qty 15

## 2021-09-14 MED ORDER — GUAIFENESIN ER 600 MG PO TB12: 600.0000 mg | ORAL_TABLET | Freq: Two times a day (BID) | ORAL | 0 refills | Status: AC

## 2021-09-14 NOTE — ED Notes (Signed)
Pt ambulated with walker approx 50 feet o2 sats maintained between 98-100% on room air

## 2021-09-14 NOTE — ED Triage Notes (Signed)
Arrived from assisted living, ems no aware of name address [place], [place]. Ems called due to SOB. Has albuterol at home but not been using per ems. Pt a&o x 4.   Hr 90 92% RA Co2 31 BP 167/80 Temp 98.1

## 2021-09-14 NOTE — ED Triage Notes (Signed)
Pt comes into the ED via EMS from [facility] with c/o SOB, cough with congestion, runny nose and watery eyes since Sunday. Pt does have some inflammation and redness around the left eye. Pt is a/ox4 on arrival. Brother/POA present

## 2021-09-14 NOTE — ED Provider Notes (Signed)
[facility] Emergency Department Provider Note  ____________________________________________   Event Date/Time   First MD Initiated Contact with Patient 09/14/21 1127     (approximate)  I have reviewed the triage vital signs and the nursing notes.   HISTORY  Chief Complaint Shortness of Breath and URI   HPI LILEIGH Craig is a 87 y.o. female with a past medical history of CKD followed by nephrology, multiple myeloma, DM, GERD, CVA and compression fracture, compression fracture who presents for assessment of approximately 3 to 4 days of cough, shortness of breath and slight redness and clear drainage from the left eye.  She denies any headache, earache, sore throat, rhinitis drainage, left eye pain double vision or blurry vision, chest pain, abdominal pain, back pain, rash or extremity pain.  She denies any nausea, vomiting, diarrhea or burning with urination.  She typically uses a walker to ambulate.  No recent surgeries.  She notes he does have an eye appoint with her ophthalmologist for later today at 2 PM.  No other acute concerns at this time.         Past Medical History:  Diagnosis Date   Cancer [facility])    multiple myeloma   Diabetes mellitus without complication ([place])    diet controlled   GERD (gastroesophageal reflux disease)    Stroke [facility])    Wears dentures    full upper and lower    Patient Active Problem List   Diagnosis Date Noted   Anemia due to stage 4 chronic kidney disease ([place]) 02/18/2020   Anemia due to stage 3 chronic kidney disease ([place]) 05/31/2018   UTI (urinary tract infection) 11/25/2015   Thoracic compression fracture ([place]) 11/25/2015    Past Surgical History:  Procedure Laterality Date   CATARACT EXTRACTION W/PHACO Right 01/17/2021   Procedure: CATARACT EXTRACTION PHACO AND INTRAOCULAR LENS PLACEMENT (IOC) RIGHT 14.31 01:33.0 15.4% ;  Surgeon: Leandrew Koyanagi, MD;  Location: [place];  Service:  Ophthalmology;  Laterality: Right;  Diabetic - no meds   CATARACT EXTRACTION W/PHACO Left 02/09/2021   Procedure: CATARACT EXTRACTION PHACO AND INTRAOCULAR LENS PLACEMENT (IOC) LEFT 15.6 01:47.4 14.5%;  Surgeon: Leandrew Koyanagi, MD;  Location: [place];  Service: Ophthalmology;  Laterality: Left;  Diabetic - oral meds    Prior to Admission medications   Medication Sig Start Date End Date Taking? Authorizing Provider  guaiFENesin (MUCINEX) 600 MG 12 hr tablet Take 1 tablet (600 mg total) by mouth 2 (two) times daily for 5 days. 09/14/21 09/19/21 Yes Lucrezia Starch, MD  albuterol (VENTOLIN HFA) 108 (90 Base) MCG/ACT inhaler Inhale 2 puffs into the lungs every 6 (six) hours as needed for wheezing or shortness of breath.    [provider]  alendronate (FOSAMAX) 70 MG tablet Take 70 mg by mouth every Monday. Take with a full glass of water on an empty stomach.    [provider]  amLODipine (NORVASC) 5 MG tablet Take 5 mg by mouth daily.    [provider]  aspirin EC 81 MG tablet Take 81 mg by mouth daily.    [provider]  atorvastatin (LIPITOR) 40 MG tablet Take 40 mg by mouth at bedtime.    [provider]  Calcium Carbonate-Vitamin D3 600-400 MG-UNIT TABS Take 1 tablet by mouth 2 (two) times daily with a meal.    [provider]  clopidogrel (PLAVIX) 75 MG tablet Take 75 mg by mouth daily.    [provider]  ferrous sulfate 325 (65 FE) MG tablet Take by mouth.    [provider]  glipiZIDE (GLUCOTROL) 5 MG tablet Take by mouth daily before breakfast. Patient not taking: Reported on 01/11/2021    [provider]  loratadine (CLARITIN) 10 MG tablet Take 10 mg by mouth daily.    [provider]  losartan (COZAAR) 25 MG tablet Take 25 mg by mouth daily.    [provider]  metoprolol succinate (TOPROL-XL) 25 MG 24 hr tablet Take 25 mg by mouth daily.    [provider]   pantoprazole (PROTONIX) 40 MG tablet Take 40 mg by mouth daily.    [provider]  tiZANidine (ZANAFLEX) 2 MG tablet Take 2 mg by mouth 3 (three) times daily as needed for muscle spasms. Patient not taking: Reported on 01/11/2021    [provider]  torsemide (DEMADEX) 20 MG tablet Take 1 tablet by mouth daily as needed. 11/11/18   [provider]  traMADol (ULTRAM) 50 MG tablet Take 50 mg by mouth 3 (three) times daily as needed for moderate pain.  Patient not taking: Reported on 01/11/2021    [provider]  traZODone (DESYREL) 50 MG tablet Take 50 mg by mouth at bedtime as needed for sleep.    [provider]    Allergies Patient has no known allergies.  No family history on file.  Social History Social History   Tobacco Use   Smoking status: Never   Smokeless tobacco: Never  Vaping Use   Vaping Use: Never used  Substance Use Topics   Alcohol use: No   Drug use: No    Review of Systems  Review of Systems  Constitutional:  Negative for chills and fever.  HENT:  Positive for congestion. Negative for sore throat.   Eyes:  Positive for discharge (clear) and redness. Negative for blurred vision, double vision, photophobia and pain.  Respiratory:  Positive for cough and shortness of breath. Negative for stridor.   Cardiovascular:  Negative for chest pain.  Gastrointestinal:  Negative for vomiting.  Genitourinary:  Negative for dysuria.  Musculoskeletal:  Negative for myalgias.  Skin:  Negative for rash.  Neurological:  Negative for seizures, loss of consciousness and headaches.  Psychiatric/Behavioral:  Negative for suicidal ideas.   All other systems reviewed and are negative.    ____________________________________________   PHYSICAL EXAM:  VITAL SIGNS: ED Triage Vitals  Enc Vitals Group     BP 09/14/21 0853 (!) 151/72     Pulse Rate 09/14/21 0853 90     Resp 09/14/21 0853 18     Temp 09/14/21 0853 99 F (37.2 C)      Temp Source 09/14/21 0853 Oral     SpO2 09/14/21 0853 100 %     Weight 09/14/21 0901 120 lb (54.4 kg)     Height 09/14/21 0901 _0  (1.448 m)     Head Circumference --      Peak Flow --      Pain Score 09/14/21 0901 0     Pain Loc --      Pain Edu? --      Excl. in [place]? --    Vitals:   09/14/21 0853 09/14/21 1213  BP: (!) 151/72 (!) 150/71  Pulse: 90 78  Resp: 18 18  Temp: 99 F (37.2 C)   SpO2: 100% 98%   Physical Exam Vitals and nursing note reviewed.  Constitutional:      General: She  is not in acute distress.    Appearance: She is well-developed.  HENT:     Head: Normocephalic and atraumatic.     Right Ear: External ear normal.     Left Ear: External ear normal.     Nose: Nose normal.  Eyes:     Conjunctiva/sclera: Conjunctivae normal.  Cardiovascular:     Rate and Rhythm: Normal rate and regular rhythm.     Heart sounds: No murmur heard. Pulmonary:     Effort: Pulmonary effort is normal. No respiratory distress.     Breath sounds: Normal breath sounds.  Abdominal:     Palpations: Abdomen is soft.     Tenderness: There is no abdominal tenderness.  Musculoskeletal:     Cervical back: Neck supple.     Right lower leg: Edema present.     Left lower leg: Edema present.  Skin:    General: Skin is warm and dry.  Neurological:     General: No focal deficit present.     Mental Status: She is alert.  Psychiatric:        Mood and Affect: Mood normal.    Lungs are clear bilaterally and patient has no significant work of breathing i.e. retractions nasal flaring.  Is a little bit of clear drainage from left eye little bit of injection but there is no proptosis or periorbital tenderness or significant edema.  Pupils are unremarkable.  Cranial nerves II through XII are grossly intact. ____________________________________________   LABS (all labs ordered are listed, but only abnormal results are displayed)  Labs Reviewed  CBC WITH DIFFERENTIAL/PLATELET - Abnormal;  Notable for the following components:      Result Value   RBC 3.41 (*)    Hemoglobin 10.9 (*)    HCT 30.7 (*)    Lymphs Abs 0.6 (*)    All other components within normal limits  COMPREHENSIVE METABOLIC PANEL - Abnormal; Notable for the following components:   Potassium 3.4 (*)    CO2 21 (*)    Glucose, Bld 136 (*)    BUN 46 (*)    Creatinine, Ser 4.56 (*)    Calcium 8.8 (*)    Albumin 3.4 (*)    AST 11 (*)    GFR, Estimated 9 (*)    All other components within normal limits  BRAIN NATRIURETIC PEPTIDE - Abnormal; Notable for the following components:   B Natriuretic Peptide 374.8 (*)    All other components within normal limits  RESP PANEL BY RT-PCR (FLU A&B, COVID) ARPGX2  TROPONIN I (HIGH SENSITIVITY)  TROPONIN I (HIGH SENSITIVITY)   ____________________________________________  EKG  ECG shows sinus rhythm with a ventricular rate of 88, normal axis, unremarkable intervals with no clear evidence of acute ischemia or significant arrhythmia. ____________________________________________  RADIOLOGY  ED MD interpretation: Chest x-ray shows no clear focal consolidation, overt edema, large effusion, pneumothorax, pneumomediastinum or other acute process.  There are some evidence of aortic atherosclerosis likely chronic.  Official radiology report(s): DG Chest 2 View  Result Date: 09/14/2021 CLINICAL DATA:  87 year old female with history of shortness of breath, cough and congestion. EXAM: CHEST - 2 VIEW COMPARISON:  Chest x-ray 09/14/2021. FINDINGS: Lung volumes are very low. No consolidative airspace disease. No pleural effusions. No pneumothorax. No pulmonary nodule or mass noted. Pulmonary vasculature and the cardiomediastinal silhouette are within normal limits. Atherosclerosis in the thoracic aorta. Old healed right-sided rib fracture incidentally noted. IMPRESSION: 1. Low lung volumes without radiographic evidence of acute cardiopulmonary disease. 2.  Aortic atherosclerosis.  Electronically Signed   By: Vinnie Langton M.D.   On: 09/14/2021 09:57    ____________________________________________   PROCEDURES  Procedure(s) performed (including Critical Care):  Procedures   ____________________________________________   INITIAL IMPRESSION / ASSESSMENT AND PLAN / ED COURSE      Patient presents with above-stated history exam for assessment of 3 4 days of cough associate with shortness shortness of breath, congestion and a little redness and clear drainage from left eye.  On arrival she is slightly hypertensive with otherwise stable vital signs on room air.  Differential includes ACS, PE, anemia, pneumonia, bronchitis possible conjunctivitis in left eye.  She denies any eye pain or decreased acuity and have a low suspicion for corneal ulcer.  There is no evidence on exam of periorbital or preseptal cellulitis.  No other obvious evidence on exam of deep space infection of the head or neck.  She is currently doing warm compresses which I advised to continue and why suspect likely viral conjunctivitis, difficult to completely rule out a bacterial conjunctivitis I will cover with a short course of sulfacetamide drops.  ECG and nonelevated troponin are not suggestive of ACS myocarditis or significant arrhythmia.   Chest x-ray shows no clear focal consolidation, overt edema, large effusion, pneumothorax, pneumomediastinum or other acute process.  There are some evidence of aortic atherosclerosis likely chronic.  CBC shows no leukocytosis and hemoglobin is at 10.9.  11.4 on 10/13 with no significant derangements.  I have low suspicion for significant acute anemia causing patient's symptoms today.  Patient has some subtle lower extremity edema she otherwise has no rhonchi on exam, significant JVD and BNP is only slightly elevated 374 and Evalose patient for acute CHF causing presentation.  In addition there is no edema on her x-ray.  I have a lower suspicion for PE at  this time given on ambulation she is not tachycardic tachypneic or hypoxic able to maintain SPO2 greater than 95%.  Overall presentation consistent with a viral bronchitis and I have low suspicion for bacterial pneumonia given absence of fever or leukocytosis or consolidation on x-ray.  Will give prescription for Mucinex outpatient follow-up with PCP and ophthalmology.  Discharged stable condition.  Strict return precautions advised and discussed.        ____________________________________________   FINAL CLINICAL IMPRESSION(S) / ED DIAGNOSES  Final diagnoses:  Bronchitis  Acute conjunctivitis of left eye, unspecified acute conjunctivitis type    Medications  sulfacetamide (BLEPH-10) 10 % ophthalmic solution 1 drop (has no administration in time range)     ED Discharge Orders          Ordered    guaiFENesin (MUCINEX) 600 MG 12 hr tablet  2 times daily        09/14/21 1335             Note:  This document was prepared using Dragon voice recognition software and may include unintentional dictation errors.    Lucrezia Starch, MD 09/14/21 1340

## 2021-09-27 DIAGNOSIS — B349 Viral infection, unspecified: Secondary | ICD-10-CM | POA: Diagnosis not present

## 2021-12-05 DIAGNOSIS — N185 Chronic kidney disease, stage 5: Secondary | ICD-10-CM | POA: Diagnosis not present

## 2021-12-05 DIAGNOSIS — I10 Essential (primary) hypertension: Secondary | ICD-10-CM | POA: Diagnosis not present

## 2021-12-05 DIAGNOSIS — E1129 Type 2 diabetes mellitus with other diabetic kidney complication: Secondary | ICD-10-CM | POA: Diagnosis not present

## 2021-12-05 DIAGNOSIS — R600 Localized edema: Secondary | ICD-10-CM | POA: Diagnosis not present

## 2021-12-05 DIAGNOSIS — D472 Monoclonal gammopathy: Secondary | ICD-10-CM | POA: Diagnosis not present

## 2022-01-18 ENCOUNTER — Inpatient Hospital Stay
Admission: EM | Admit: 2022-01-18 | Discharge: 2022-01-23 | DRG: 291 | Disposition: A | Discharge status: Home / Self Care | Payer: Medicare HMO | Attending: Hospitalist | Admitting: Hospitalist

## 2022-01-18 ENCOUNTER — Emergency Department: Payer: Medicare HMO

## 2022-01-18 ENCOUNTER — Other Ambulatory Visit: Payer: Self-pay

## 2022-01-18 DIAGNOSIS — G4700 Insomnia, unspecified: Secondary | ICD-10-CM | POA: Diagnosis present

## 2022-01-18 DIAGNOSIS — R0602 Shortness of breath: Secondary | ICD-10-CM

## 2022-01-18 DIAGNOSIS — J9601 Acute respiratory failure with hypoxia: Secondary | ICD-10-CM | POA: Diagnosis present

## 2022-01-18 DIAGNOSIS — D631 Anemia in chronic kidney disease: Secondary | ICD-10-CM | POA: Diagnosis not present

## 2022-01-18 DIAGNOSIS — Z515 Encounter for palliative care: Secondary | ICD-10-CM | POA: Diagnosis not present

## 2022-01-18 DIAGNOSIS — Z961 Presence of intraocular lens: Secondary | ICD-10-CM | POA: Diagnosis present

## 2022-01-18 DIAGNOSIS — Z9842 Cataract extraction status, left eye: Secondary | ICD-10-CM | POA: Diagnosis not present

## 2022-01-18 DIAGNOSIS — R069 Unspecified abnormalities of breathing: Secondary | ICD-10-CM | POA: Diagnosis not present

## 2022-01-18 DIAGNOSIS — N186 End stage renal disease: Secondary | ICD-10-CM | POA: Diagnosis not present

## 2022-01-18 DIAGNOSIS — Z7982 Long term (current) use of aspirin: Secondary | ICD-10-CM | POA: Diagnosis not present

## 2022-01-18 DIAGNOSIS — R0902 Hypoxemia: Secondary | ICD-10-CM | POA: Diagnosis not present

## 2022-01-18 DIAGNOSIS — I272 Pulmonary hypertension, unspecified: Secondary | ICD-10-CM | POA: Diagnosis present

## 2022-01-18 DIAGNOSIS — R062 Wheezing: Secondary | ICD-10-CM | POA: Diagnosis not present

## 2022-01-18 DIAGNOSIS — I132 Hypertensive heart and chronic kidney disease with heart failure and with stage 5 chronic kidney disease, or end stage renal disease: Principal | ICD-10-CM | POA: Diagnosis present

## 2022-01-18 DIAGNOSIS — Z7902 Long term (current) use of antithrombotics/antiplatelets: Secondary | ICD-10-CM

## 2022-01-18 DIAGNOSIS — Z66 Do not resuscitate: Secondary | ICD-10-CM | POA: Diagnosis present

## 2022-01-18 DIAGNOSIS — Z9841 Cataract extraction status, right eye: Secondary | ICD-10-CM | POA: Diagnosis not present

## 2022-01-18 DIAGNOSIS — Z79899 Other long term (current) drug therapy: Secondary | ICD-10-CM | POA: Diagnosis not present

## 2022-01-18 DIAGNOSIS — Z8673 Personal history of transient ischemic attack (TIA), and cerebral infarction without residual deficits: Secondary | ICD-10-CM

## 2022-01-18 DIAGNOSIS — D638 Anemia in other chronic diseases classified elsewhere: Secondary | ICD-10-CM | POA: Diagnosis present

## 2022-01-18 DIAGNOSIS — I5033 Acute on chronic diastolic (congestive) heart failure: Secondary | ICD-10-CM | POA: Diagnosis not present

## 2022-01-18 DIAGNOSIS — I120 Hypertensive chronic kidney disease with stage 5 chronic kidney disease or end stage renal disease: Secondary | ICD-10-CM | POA: Diagnosis not present

## 2022-01-18 DIAGNOSIS — N184 Chronic kidney disease, stage 4 (severe): Secondary | ICD-10-CM | POA: Diagnosis not present

## 2022-01-18 DIAGNOSIS — E538 Deficiency of other specified B group vitamins: Secondary | ICD-10-CM | POA: Diagnosis not present

## 2022-01-18 DIAGNOSIS — N179 Acute kidney failure, unspecified: Principal | ICD-10-CM | POA: Diagnosis present

## 2022-01-18 DIAGNOSIS — N185 Chronic kidney disease, stage 5: Secondary | ICD-10-CM | POA: Diagnosis present

## 2022-01-18 DIAGNOSIS — D529 Folate deficiency anemia, unspecified: Secondary | ICD-10-CM | POA: Diagnosis not present

## 2022-01-18 DIAGNOSIS — D649 Anemia, unspecified: Secondary | ICD-10-CM | POA: Diagnosis not present

## 2022-01-18 DIAGNOSIS — E1122 Type 2 diabetes mellitus with diabetic chronic kidney disease: Secondary | ICD-10-CM | POA: Diagnosis present

## 2022-01-18 DIAGNOSIS — Z85820 Personal history of malignant melanoma of skin: Secondary | ICD-10-CM | POA: Diagnosis not present

## 2022-01-18 DIAGNOSIS — J810 Acute pulmonary edema: Secondary | ICD-10-CM | POA: Diagnosis not present

## 2022-01-18 DIAGNOSIS — Z7189 Other specified counseling: Secondary | ICD-10-CM | POA: Diagnosis not present

## 2022-01-18 DIAGNOSIS — K219 Gastro-esophageal reflux disease without esophagitis: Secondary | ICD-10-CM | POA: Diagnosis present

## 2022-01-18 DIAGNOSIS — I10 Essential (primary) hypertension: Secondary | ICD-10-CM | POA: Diagnosis not present

## 2022-01-18 DIAGNOSIS — R0689 Other abnormalities of breathing: Secondary | ICD-10-CM | POA: Diagnosis not present

## 2022-01-18 LAB — CBC
HCT: 26.8 % — ABNORMAL LOW (ref 36.0–46.0)
Hemoglobin: 8.6 g/dL — ABNORMAL LOW (ref 12.0–15.0)
MCH: 29.6 pg (ref 26.0–34.0)
MCHC: 32.1 g/dL (ref 30.0–36.0)
MCV: 92.1 fL (ref 80.0–100.0)
Platelets: 269 10*3/uL (ref 150–400)
RBC: 2.91 MIL/uL — ABNORMAL LOW (ref 3.87–5.11)
RDW: 14.4 % (ref 11.5–15.5)
WBC: 7.1 10*3/uL (ref 4.0–10.5)
nRBC: 0 % (ref 0.0–0.2)

## 2022-01-18 LAB — BRAIN NATRIURETIC PEPTIDE: B Natriuretic Peptide: 688.7 pg/mL — ABNORMAL HIGH (ref 0.0–100.0)

## 2022-01-18 LAB — IRON AND TIBC
Iron: 15 ug/dL — ABNORMAL LOW (ref 28–170)
Saturation Ratios: 7 % — ABNORMAL LOW (ref 10.4–31.8)
TIBC: 227 ug/dL — ABNORMAL LOW (ref 250–450)
UIBC: 212 ug/dL

## 2022-01-18 LAB — TROPONIN I (HIGH SENSITIVITY)
Troponin I (High Sensitivity): 98 ng/L — ABNORMAL HIGH (ref <18)
Troponin I (High Sensitivity): 110 ng/L (ref <18)

## 2022-01-18 LAB — PROCALCITONIN: Procalcitonin: <0.1 ng/mL

## 2022-01-18 LAB — LACTIC ACID, PLASMA: Lactic Acid, Venous: 0.8 mmol/L (ref 0.5–1.9)

## 2022-01-18 LAB — BASIC METABOLIC PANEL
Anion gap: 12 (ref 5–15)
BUN: 41 mg/dL — ABNORMAL HIGH (ref 8–23)
CO2: 20 mmol/L — ABNORMAL LOW (ref 22–32)
Calcium: 9 mg/dL (ref 8.9–10.3)
Chloride: 106 mmol/L (ref 98–111)
Creatinine, Ser: 4.45 mg/dL — ABNORMAL HIGH (ref 0.44–1.00)
GFR, Estimated: 9 mL/min — ABNORMAL LOW (ref >60)
Glucose, Bld: 100 mg/dL — ABNORMAL HIGH (ref 70–99)
Potassium: 3.9 mmol/L (ref 3.5–5.1)
Sodium: 138 mmol/L (ref 135–145)

## 2022-01-18 LAB — FERRITIN: Ferritin: 143 ng/mL (ref 11–307)

## 2022-01-18 LAB — ALBUMIN: Albumin: 3.1 g/dL — ABNORMAL LOW (ref 3.5–5.0)

## 2022-01-18 LAB — PHOSPHORUS: Phosphorus: 3.7 mg/dL (ref 2.5–4.6)

## 2022-01-18 MED ORDER — CLOPIDOGREL BISULFATE 75 MG PO TABS
75.0000 mg | ORAL_TABLET | Freq: Every day | ORAL | Status: DC
Start: 2022-01-19 — End: 2022-01-19
  Administered 2022-01-19: 75 mg via ORAL
  Filled 2022-01-18: qty 1

## 2022-01-18 MED ORDER — FUROSEMIDE 10 MG/ML IJ SOLN: 40.0000 mg | Freq: Once | INTRAMUSCULAR | Status: DC

## 2022-01-18 MED ORDER — FUROSEMIDE 10 MG/ML IJ SOLN
60.0000 mg | Freq: Two times a day (BID) | INTRAMUSCULAR | Status: DC
  Administered 2022-01-18 – 2022-01-22 (×9): 60 mg via INTRAVENOUS
  Filled 2022-01-18 (×9): qty 8

## 2022-01-18 MED ORDER — MORPHINE SULFATE (PF) 2 MG/ML IV SOLN: 2.0000 mg | INTRAVENOUS | Status: DC | PRN

## 2022-01-18 MED ORDER — ACETAMINOPHEN 325 MG PO TABS
650.0000 mg | ORAL_TABLET | Freq: Four times a day (QID) | ORAL | Status: DC | PRN
  Administered 2022-01-23: 650 mg via ORAL
  Filled 2022-01-18: qty 2

## 2022-01-18 MED ORDER — ASPIRIN EC 81 MG PO TBEC
81.0000 mg | DELAYED_RELEASE_TABLET | Freq: Every day | ORAL | Status: DC
  Administered 2022-01-19: 81 mg via ORAL
  Filled 2022-01-18: qty 1

## 2022-01-18 MED ORDER — HEPARIN SODIUM (PORCINE) 5000 UNIT/ML IJ SOLN
5000.0000 [IU] | Freq: Three times a day (TID) | INTRAMUSCULAR | Status: DC
  Administered 2022-01-18 – 2022-01-19 (×3): 5000 [IU] via SUBCUTANEOUS
  Filled 2022-01-18 (×3): qty 1

## 2022-01-18 MED ORDER — ALBUTEROL SULFATE (2.5 MG/3ML) 0.083% IN NEBU: 2.5000 mL | INHALATION_SOLUTION | RESPIRATORY_TRACT | Status: DC | PRN

## 2022-01-18 MED ORDER — IPRATROPIUM-ALBUTEROL 0.5-2.5 (3) MG/3ML IN SOLN
3.0000 mL | Freq: Once | RESPIRATORY_TRACT | Status: AC
  Administered 2022-01-18: 3 mL via RESPIRATORY_TRACT

## 2022-01-18 MED ORDER — ALUM & MAG HYDROXIDE-SIMETH 200-200-20 MG/5ML PO SUSP
30.0000 mL | Freq: Three times a day (TID) | ORAL | Status: DC | PRN
  Administered 2022-01-18 – 2022-01-19 (×2): 30 mL via ORAL
  Filled 2022-01-18 (×2): qty 30

## 2022-01-18 MED ORDER — SODIUM CHLORIDE 0.9% FLUSH
3.0000 mL | Freq: Two times a day (BID) | INTRAVENOUS | Status: DC
  Administered 2022-01-18 – 2022-01-22 (×9): 3 mL via INTRAVENOUS

## 2022-01-18 MED ORDER — TRAZODONE HCL 50 MG PO TABS
50.0000 mg | ORAL_TABLET | Freq: Every day | ORAL | Status: DC
Start: 2022-01-18 — End: 2022-01-23
  Administered 2022-01-18 – 2022-01-22 (×5): 50 mg via ORAL
  Filled 2022-01-18 (×5): qty 1

## 2022-01-18 MED ORDER — ACETAMINOPHEN 650 MG RE SUPP: 650.0000 mg | Freq: Four times a day (QID) | RECTAL | Status: DC | PRN

## 2022-01-18 NOTE — Assessment & Plan Note (Addendum)
history of MGUS.  Microcytic  ?--Hgb 8.6 on presentation ?--folate and Vit B12 both low ?--s/p IV iron x1, started on folic acid and Vit J00 supplements ?Plan: ?--cont folic acid and vit X38 supplements ?

## 2022-01-18 NOTE — ED Triage Notes (Signed)
Patient to ER via [facility] from [facility] ([place]). Reports 2-3 days of worsening shortness of breath. On EMS arrival, patient's room air sats at 88%. Wheezing/ Rhonchi throughout. EMS administered x5 albuterol breathing treatments with improvement of sats to 98-100%.  ? ?Ems VS- ra sats 88%, temp 98.7, HR 91, etco2 34. ? ?Patient on room air at baseline.  ?

## 2022-01-18 NOTE — ED Provider Notes (Signed)
? ?[facility] ?Provider Note ? ? ? Event Date/Time  ? First MD Initiated Contact with Patient 01/18/22 0840   ?  (approximate) ? ? ?History  ? ?Respiratory Distress ? ? ?HPI ? ? ?Carrie Craig is a 88 y.o. female history of diabetes stroke ? ?Patient presents increasing shortness of breath since Sunday with a cough.  Denies fever.  Feels she just cannot catch her breath been getting worse daily.  Nothing seems to make it better or worse. ? ?Tried a breathing treatment last night.  Denies history of COPD or asthma.  Does have a history of chronic kidney problems ? ?No chest pain.  No leg swelling.  No fever.  Just feels like increasing shortness of breath for the last 3 or so days ? ?No abdominal pain no nausea or vomiting.  Does not feel bloated ? ? ?  ? ? ?Physical Exam  ? ?Triage Vital Signs: ?ED Triage Vitals  ?Enc Vitals Group  ?   BP 01/18/22 0826 (!) 171/76  ?   Pulse Rate 01/18/22 0826 77  ?   Resp 01/18/22 0826 (!) 28  ?   Temp --   ?   Temp src --   ?   SpO2 01/18/22 0826 100 %  ?   Weight --   ?   Height 01/18/22 0827 4\' 9"  (0.017 m)  ?   Head Circumference --   ?   Peak Flow --   ?   Pain Score 01/18/22 0827 0  ?   Pain Loc --   ?   Pain Edu? --   ?   Excl. in [place]? --   ? ? ?Most recent vital signs: ?Vitals:  ? 01/18/22 0956 01/18/22 1000  ?BP:  (!) 166/98  ?Pulse:  92  ?Resp:  14  ?Temp: 98.9 ?F (37.2 ?C)   ?SpO2:  100%  ? ? ? ?General: Awake, mild increased work of breathing.  No tiring.  Mild accessory muscle use and mild to moderate tachypnea. ?CV:  Good peripheral perfusion.  Regular rate and rhythm, no murmurs ?Resp:  Tachypnea with mild accessory muscle use.  Crackles noted in the lower lung fields bilaterally ?Abd:  No distention.  ?Other:  No lower extremity edema to noted. ? ? ?ED Results / Procedures / Treatments  ? ?Labs ?(all labs ordered are listed, but only abnormal results are displayed) ?Labs Reviewed  ?BASIC METABOLIC PANEL - Abnormal; Notable for the following  components:  ?    Result Value  ? CO2 20 (*)   ? Glucose, Bld 100 (*)   ? BUN 41 (*)   ? Creatinine, Ser 4.45 (*)   ? GFR, Estimated 9 (*)   ? All other components within normal limits  ?CBC - Abnormal; Notable for the following components:  ? RBC 2.91 (*)   ? Hemoglobin 8.6 (*)   ? HCT 26.8 (*)   ? All other components within normal limits  ?BRAIN NATRIURETIC PEPTIDE - Abnormal; Notable for the following components:  ? B Natriuretic Peptide 688.7 (*)   ? All other components within normal limits  ?TROPONIN I (HIGH SENSITIVITY) - Abnormal; Notable for the following components:  ? Troponin I (High Sensitivity) 98 (*)   ? All other components within normal limits  ?CULTURE, BLOOD (ROUTINE X 2)  ?CULTURE, BLOOD (ROUTINE X 2)  ?PROCALCITONIN  ?LACTIC ACID, PLASMA  ?TROPONIN I (HIGH SENSITIVITY)  ? ? ? ?EKG ? ?Reviewed interpreted by me  at 850 ?Heart rate 99 QRS 89 QTc 450 ?Normal sinus rhythm no evidence of ischemia ? ?RADIOLOGY ? ? ? ?I personally viewed and interpreted the patient's chest x-ray concerning for pulmonary edema or diffuse infiltrative process ? ? ? ?PROCEDURES: ? ?Critical Care performed: Yes, see critical care procedure note(s) ? ?CRITICAL CARE ?Performed by: Delman Kitten ? ? ?Total critical care time: 25 minutes ? ?Critical care time was exclusive of separately billable procedures and treating other patients. ? ?Critical care was necessary to treat or prevent imminent or life-threatening deterioration. ? ?Critical care was time spent personally by me on the following activities: development of treatment plan with patient and/or surrogate as well as nursing, discussions with consultants, evaluation of patient's response to treatment, examination of patient, obtaining history from patient or surrogate, ordering and performing treatments and interventions, ordering and review of laboratory studies, ordering and review of radiographic studies, pulse oximetry and re-evaluation of patient's  condition. ? ?Patient with findings concerning for acute onset pulmonary edema.  Her BNP is elevated her procalcitonin is low she is afebrile with a normal white count and based upon her chest x-ray finding and clinical history I suspect she has acute end-stage renal disease with volume overload.  Required consultation with nephrology, seen by Dr. Raliegh Ip of nephrology and he advises consistent with end-stage renal disease.  He also tells me the patient is not interested in hemodialysis but his team will continue to follow, recommends IV Lasix 60 mg which is team has ordered.  Discussed case and admission with the hospitalist Dr. Tanda Rockers ? ?Procedures ? ? ?MEDICATIONS ORDERED IN ED: ?Medications  ?furosemide (LASIX) injection 60 mg (has no administration in time range)  ?ipratropium-albuterol (DUONEB) 0.5-2.5 (3) MG/3ML nebulizer solution 3 mL (3 mLs Nebulization Given 01/18/22 0911)  ? ? ? ?IMPRESSION / MDM / ASSESSMENT AND PLAN / ED COURSE  ?I reviewed the triage vital signs and the nursing notes. ?             ?               ? ?Differential diagnosis includes, but is not limited to, pneumonia, CHF ESRD, pulmonary edema, pneumothorax, feel less likely something like pulmonary embolism as no associated chest pain no tachycardia. ?No clear evidence of acute ACS.  She does have an elevated troponin, but no chest pain I suspect likely demand in nature.  Troponins will continue to be trended ? ?Admission discussed with Dr. Tanda Rockers ? ?Neb given, but without significant change.  She does not appear to be wheezing somewhat does have crackles in her bases. ? ? ?The patient is on the cardiac monitor to evaluate for evidence of arrhythmia and/or significant heart rate changes. ? ?I independently reviewed the patient's images, obtain consultation from nephrology as well as hospitalist service.  Patient will be admitted to hospitalist service. ? ?Clinical Course as of 01/18/22 1028  ?Wed Jan 18, 2022  ?1002 Consult placed and has been  reviewed by Dr. Abigail Butts of nephrology.  [MQ]  ?  ?Clinical Course User Index ?[MQ] Delman Kitten, MD  ? ? ? ?FINAL CLINICAL IMPRESSION(S) / ED DIAGNOSES  ? ?Final diagnoses:  ?Acute respiratory failure with hypoxia ([place])  ?End-stage renal disease ?Acute pulmonary edema ? ? ?Rx / DC Orders  ? ?ED Discharge Orders   ? ? None  ? ?  ? ? ? ?Note:  This document was prepared using Dragon voice recognition software and may include unintentional dictation errors. ?  ?Kaitlynd Phillips,  Elta Guadeloupe, MD ?01/18/22 1029 ? ?

## 2022-01-18 NOTE — Assessment & Plan Note (Addendum)
-   Not on oxygen at home.  Etiology considered due to underlying pulmonary edema from renal failure.  Other considered differential includes cardiac in origin or contribution vs possible LLL infiltrate but low suspicion for infection at this time (no fever, WBC, negative PCT) therefore hold off on abx at this time ?Plan: ?--transition diuretic to torsemide 20 mg daily today per nephro ?--Continue supplemental O2 to keep sats between 88-92% ? ?

## 2022-01-18 NOTE — Progress Notes (Signed)
?[facility]  ?ROUNDING NOTE  ? ?Subjective:  ? ?[name] was admitted to [facility] on 01/18/2022 for Acute on chronic kidney failure ([place]) [N17.9, N18.9] ? ?Patient was last seen by my partner, Dr. Murlean Iba, on 07/06/21 for chronic kidney disease. Patient at that time decided that she does not want dialysis.  ? ?Patient's brother is at bedside who assists with history taking. Patient presents with progressive shortness of breath and cough for last 3 days. Patient has been taking torsemide daily and does endorse a response to her medications.  ? ?Nephrology consulted for creatinine of 4.5 with GFR of 9 , normocytic anemia with hemoglobin of 8.9, and bilateral pulmonary edema.  ? ?Patient confirms that she does not want dialysis.  ? ? ?Objective:  ?Vital signs in last 24 hours:  ?Temp:  [98.9 ?F (37.2 ?C)] 98.9 ?F (37.2 ?C) (03/01 4010) ?Pulse Rate:  [77-97] 95 (03/01 1030) ?Resp:  [14-28] 21 (03/01 1030) ?BP: (156-171)/(73-98) 164/76 (03/01 1030) ?SpO2:  [99 %-100 %] 99 % (03/01 1030) ? ?Weight change:  ?There were no vitals filed for this visit. ? ?Intake/Output: ?No intake/output data recorded. ?  ?Intake/Output this shift: ? No intake/output data recorded. ? ?Physical Exam: ?General: NAD, laying in stretcher  ?Head: Normocephalic, atraumatic. Moist oral mucosal membranes  ?Eyes: Anicteric, PERRL  ?Neck: Supple, trachea midline  ?Lungs:  Bilateral crackles, 3L [place] O2  ?Heart: Regular rate and rhythm  ?Abdomen:  Soft, nontender  ?Extremities:  no peripheral edema.  ?Neurologic: Nonfocal, moving all four extremities  ?Skin: No lesions  ?Access: none  ? ? ?Basic Metabolic Panel: ?Recent Labs  ?Lab 01/18/22 ?0830  ?NA 138  ?K 3.9  ?CL 106  ?CO2 20*  ?GLUCOSE 100*  ?BUN 41*  ?CREATININE 4.45*  ?CALCIUM 9.0  ? ? ?Liver Function Tests: ?No results for input(s): AST, ALT, ALKPHOS, BILITOT, PROT, ALBUMIN in the last 168 hours. ?No results for input(s): LIPASE, AMYLASE in the last 168 hours. ?No results  for input(s): AMMONIA in the last 168 hours. ? ?CBC: ?Recent Labs  ?Lab 01/18/22 ?0830  ?WBC 7.1  ?HGB 8.6*  ?HCT 26.8*  ?MCV 92.1  ?PLT 269  ? ? ?Cardiac Enzymes: ?No results for input(s): CKTOTAL, CKMB, CKMBINDEX, TROPONINI in the last 168 hours. ? ?BNP: ?Invalid input(s): POCBNP ? ?CBG: ?No results for input(s): GLUCAP in the last 168 hours. ? ?Microbiology: ?Results for orders placed or performed during the hospital encounter of 09/14/21  ?Resp Panel by RT-PCR (Flu A&B, Covid) Nasopharyngeal Swab     Status: None  ? Collection Time: 09/14/21 11:30 AM  ? Specimen: Nasopharyngeal Swab; Nasopharyngeal(NP) swabs in vial transport medium  ?Result Value Ref Range Status  ? SARS Coronavirus 2 by RT PCR NEGATIVE NEGATIVE Final  ?  Comment: (NOTE) ?SARS-CoV-2 target nucleic acids are NOT DETECTED. ? ?The SARS-CoV-2 RNA is generally detectable in upper respiratory ?specimens during the acute phase of infection. The lowest ?concentration of SARS-CoV-2 viral copies this assay can detect is ?138 copies/mL. A negative result does not preclude SARS-Cov-2 ?infection and should not be used as the sole basis for treatment or ?other patient management decisions. A negative result may occur with  ?improper specimen collection/handling, submission of specimen other ?than nasopharyngeal swab, presence of viral mutation(s) within the ?areas targeted by this assay, and inadequate number of viral ?copies(<138 copies/mL). A negative result must be combined with ?clinical observations, patient history, and epidemiological ?information. The expected result is Negative. ? ?Fact Sheet for  Patients:  ?EntrepreneurPulse.com.au ? ?Fact Sheet for Healthcare Providers:  ?IncredibleEmployment.be ? ?This test is no t yet approved or cleared by the [place] FDA and  ?has been authorized for detection and/or diagnosis of SARS-CoV-2 by ?FDA under an Emergency Use Authorization (EUA). This EUA will remain  ?in  effect (meaning this test can be used) for the duration of the ?COVID-19 declaration under Section 564(b)(1) of the Act, 21 ?U.S.C.section 360bbb-3(b)(1), unless the authorization is terminated  ?or revoked sooner.  ? ? ?  ? Influenza A by PCR NEGATIVE NEGATIVE Final  ? Influenza B by PCR NEGATIVE NEGATIVE Final  ?  Comment: (NOTE) ?The Xpert Xpress SARS-CoV-2/FLU/RSV plus assay is intended as an aid ?in the diagnosis of influenza from Nasopharyngeal swab specimens and ?should not be used as a sole basis for treatment. Nasal washings and ?aspirates are unacceptable for Xpert Xpress SARS-CoV-2/FLU/RSV ?testing. ? ?Fact Sheet for Patients: ?EntrepreneurPulse.com.au ? ?Fact Sheet for Healthcare Providers: ?IncredibleEmployment.be ? ?This test is not yet approved or cleared by the [place] FDA and ?has been authorized for detection and/or diagnosis of SARS-CoV-2 by ?FDA under an Emergency Use Authorization (EUA). This EUA will remain ?in effect (meaning this test can be used) for the duration of the ?COVID-19 declaration under Section 564(b)(1) of the Act, 21 U.S.C. ?section 360bbb-3(b)(1), unless the authorization is terminated or ?revoked. ? ?Performed at [facility], [place], ?[place] [address] ?  ? ? ?Coagulation Studies: ?No results for input(s): LABPROT, INR in the last 72 hours. ? ?Urinalysis: ?No results for input(s): COLORURINE, LABSPEC, [place], GLUCOSEU, HGBUR, BILIRUBINUR, KETONESUR, PROTEINUR, UROBILINOGEN, NITRITE, LEUKOCYTESUR in the last 72 hours. ? ?Invalid input(s): APPERANCEUR  ? ? ?Imaging: ?DG Chest Port 1 View ? ?Result Date: 01/18/2022 ?CLINICAL DATA:  Worsening shortness of breath. EXAM: PORTABLE CHEST 1 VIEW COMPARISON:  September 14, 2021 FINDINGS: Enlarged cardiac silhouette. Calcific atherosclerotic disease and tortuosity of the aorta. Low lung volumes. Diffuse bilateral linear and ground-glass opacities throughout both lungs.  Possible left pleural effusion and/or more confluent consolidation in the left lung base. IMPRESSION: 1. Diffuse bilateral linear and ground-glass opacities throughout both lungs may represent mixed pattern pulmonary edema or multifocal, possibly atypical pneumonia with area of confluent consolidation in the left lung base. Electronically Signed   By: Fidela Salisbury M.D.   On: 01/18/2022 08:48   ? ? ?Medications:  ? ? ? furosemide  60 mg Intravenous Q12H  ? ? ? ?Assessment/ Plan:  ?[name] is a 88 y.o. white female with MGUS, diabetes mellitus type II diet controlled, GERD, CVA, hypertension, insomnia who is admitted to [facility] on 01/18/2022 ?for Acute on chronic kidney failure ([place]) [N17.9, N18.9]  ? ?Acute kidney injury on chronic kidney disease stage V verses progression of chronic kidney disease to end stage renal disease: baseline creatinine of 4.27, GFR of 10 on 12/05/2021. Patient does not want dialysis.  ?- IV furosemide ?- palliative care  ?- no indication for dialysis at this time.  ? ?Anemia with chronic kidney disease: with history of MGUS. History of requiring IV iron. Microcytic ?- check iron levels, SPEP/UPEP ?- patient may benefit from ESA ? ?Hypertension with chronic kidney disease: with echocardiogram from 12/17/2019 with preserved ejection fraction. Elevated 166/98 when examined. Volume driven. Home regimen of amlodipine, losartan, metoprolol and torsemide.  ?- recommend restarting home medications and restart torsemide when patient is closer to her dry weight.  ? ? ? LOS: 0 ?Tyqwan Pink ?3/1/202311:22 AM ?  ?

## 2022-01-18 NOTE — ED Notes (Signed)
MD Girguis messaged to inform of troponin of 110 at this time.  ?

## 2022-01-18 NOTE — H&P (Signed)
History and Physical    Patient: Carrie Craig TGG:[id number] DOB: 09-04-1932 DOA: 01/18/2022 DOS: the patient was seen and examined on 01/18/2022 PCP: Dion Body, MD  Patient coming from: Home  Chief Complaint:  Chief Complaint  Patient presents with   Respiratory Distress    HPI:  Carrie Craig is an 88 year old female with PMH DM II, CVA, history of myeloma, progressive CKD who presented with worsening shortness of breath and hypoxia at home. CXR was obtained which showed low lung volumes but concern for underlying pulmonary edema. Other notable labs included BUN 41, creatinine 4.45, CO2 20. WBC 7.1, hemoglobin 8.6 g/dL. She was afebrile and initial procalcitonin also negative. She was placed on oxygen in the ER due to acute hypoxia. Trending of her labs over the past 1 to 2 years showed progressively worsening renal function. Nephrology was consulted on admission as well.  Discussion also held bedside with patient and her brother; she was declining going on dialysis if needed and was able to explain consequences of not doing so to include imminent death if renal function continued to decline rapidly.   Assessment and Plan: * Acute renal failure superimposed on stage 5 chronic kidney disease, not on chronic dialysis ([place])- (present on admission) - patient has history of CKD5. Baseline creat ~ 3.4, eGFR 12 - patient presents with increase in creat >0.3 mg/dL above baseline, creat increase >1.5x baseline presumed to have occurred within past 7 days PTA -Given pattern, patient likely developing progressive renal failure requiring dialysis.  After thorough bedside discussion, patient declining dialysis and understands risks and benefits and able to explain them appropriately -Nephrology also following, greatly appreciate assistance - Trial of Lasix in efforts to relieve respiratory distress and assess renal response - Patient okay with palliative care consult as well -strict I&O -  s/p renal u/s in 2019 and 2020 showing decreasing kidney sizes and concerns for medical renal disease; likely no big change if repeated and low concern for outlet obstruction at this time but will monitor for any signs to suggest  Acute respiratory failure with hypoxia ([place]) - Not on oxygen at home.  Etiology considered due to underlying pulmonary edema from renal failure.  Other considered differential includes cardiac in origin or contribution vs possible LLL infiltrate but low suspicion for infection at this time (no fever, WBC, negative PCT) therefore hold off on abx at this time - Check echo - Continue O2 and wean as able - Monitor Lasix response  Normocytic anemia - Follow-up SPEP/UPEP - Follow-up iron levels     eview of Systems: Review of Systems  Constitutional:  Negative for chills and fever.  HENT: Negative.    Eyes: Negative.   Respiratory:  Positive for shortness of breath and wheezing. Negative for cough.   Cardiovascular:  Negative for chest pain, palpitations and leg swelling.  Gastrointestinal: Negative.   Genitourinary: Negative.   Musculoskeletal: Negative.   Skin: Negative.   Neurological: Negative.   Endo/Heme/Allergies: Negative.   Psychiatric/Behavioral: Negative.     Past Medical History:  Diagnosis Date   Cancer [facility])    multiple myeloma   Diabetes mellitus without complication ([place])    diet controlled   GERD (gastroesophageal reflux disease)    Stroke [facility])    Wears dentures    full upper and lower   Past Surgical History:  Procedure Laterality Date   CATARACT EXTRACTION W/PHACO Right 01/17/2021   Procedure: CATARACT EXTRACTION PHACO AND INTRAOCULAR LENS PLACEMENT (IOC) RIGHT 14.31 01:33.0 15.4% ;  Surgeon: Leandrew Koyanagi, MD;  Location: [place];  Service: Ophthalmology;  Laterality: Right;  Diabetic - no meds   CATARACT EXTRACTION W/PHACO Left 02/09/2021   Procedure: CATARACT EXTRACTION PHACO AND INTRAOCULAR LENS PLACEMENT (IOC)  LEFT 15.6 01:47.4 14.5%;  Surgeon: Leandrew Koyanagi, MD;  Location: [place];  Service: Ophthalmology;  Laterality: Left;  Diabetic - oral meds   Social History:  reports that she has never smoked. She has never used smokeless tobacco. She reports that she does not drink alcohol and does not use drugs.  No Known Allergies  No family history on file.  Prior to Admission medications   Medication Sig Start Date End Date Taking? Authorizing Provider  albuterol (VENTOLIN HFA) 108 (90 Base) MCG/ACT inhaler Inhale 2 puffs into the lungs every 4 (four) hours as needed for wheezing or shortness of breath.   Yes [provider]  amLODipine (NORVASC) 10 MG tablet Take 10 mg by mouth daily.   Yes [provider]  aspirin EC 81 MG tablet Take 81 mg by mouth daily.   Yes [provider]  atorvastatin (LIPITOR) 40 MG tablet Take 40 mg by mouth at bedtime.   Yes [provider]  benzonatate (TESSALON) 200 MG capsule Take 200 mg by mouth 3 (three) times daily as needed for cough.   Yes [provider]  Calcium Carbonate-Vitamin D3 600-400 MG-UNIT TABS Take 1 tablet by mouth 2 (two) times daily with a meal.   Yes [provider]  clopidogrel (PLAVIX) 75 MG tablet Take 75 mg by mouth daily.   Yes [provider]  dextromethorphan-guaiFENesin (MUCINEX DM) 30-600 MG 12hr tablet Take 1 tablet by mouth 2 (two) times daily as needed for cough.   Yes [provider]  ferrous sulfate 325 (65 FE) MG tablet Take 325 mg by mouth every other day.   Yes [provider]  loratadine (CLARITIN) 10 MG tablet Take 10 mg by mouth daily.   Yes [provider]  losartan (COZAAR) 25 MG tablet Take 25 mg by mouth every evening.   Yes [provider]  metoprolol succinate (TOPROL-XL) 25 MG 24 hr tablet Take 25 mg by mouth daily.   Yes [provider]  pantoprazole (PROTONIX) 40 MG tablet Take 40 mg by mouth daily.    Yes [provider]  torsemide (DEMADEX) 20 MG tablet Take 20 mg by mouth daily as needed.   Yes [provider]  traZODone (DESYREL) 50 MG tablet Take 50 mg by mouth at bedtime.   Yes [provider]    Physical Exam: Vitals:   01/18/22 1100 01/18/22 1130 01/18/22 1320 01/18/22 1400  BP: (!) 166/95 (!) 152/86 (!) 144/86 (!) 161/73  Pulse: 95 96 99 92  Resp: 17 13 16 18   Temp:   98.3 F (36.8 C)   TempSrc:      SpO2: 99% 100% 96% 99%  Height:       Physical Exam Constitutional:      Appearance: Normal appearance.  HENT:     Head: Normocephalic and atraumatic.     Mouth/Throat:     Mouth: Mucous membranes are moist.  Eyes:     Extraocular Movements: Extraocular movements intact.  Cardiovascular:     Rate and Rhythm: Normal rate and regular rhythm.  Pulmonary:     Comments: Mild expiratory wheezing, wet rales appreciated Abdominal:     General: Bowel sounds are normal. There is no distension.     Palpations:  Abdomen is soft.     Tenderness: There is no abdominal tenderness.  Musculoskeletal:        General: No swelling. Normal range of motion.     Cervical back: Normal range of motion and neck supple.  Skin:    General: Skin is warm and dry.  Neurological:     General: No focal deficit present.     Mental Status: She is alert.     Comments: No asterixis appreciated  Psychiatric:        Mood and Affect: Mood normal.        Behavior: Behavior normal.     Data Reviewed: Results for orders placed or performed during the hospital encounter of 01/18/22 (from the past 24 hour(s))  Basic metabolic panel     Status: Abnormal   Collection Time: 01/18/22  8:30 AM  Result Value Ref Range   Sodium 138 135 - 145 mmol/L   Potassium 3.9 3.5 - 5.1 mmol/L   Chloride 106 98 - 111 mmol/L   CO2 20 (L) 22 - 32 mmol/L   Glucose, Bld 100 (H) 70 - 99 mg/dL   BUN 41 (H) 8 - 23 mg/dL   Creatinine, Ser 4.45 (H) 0.44 - 1.00 mg/dL   Calcium 9.0 8.9 - 10.3  mg/dL   GFR, Estimated 9 (L) >60 mL/min   Anion gap 12 5 - 15  CBC     Status: Abnormal   Collection Time: 01/18/22  8:30 AM  Result Value Ref Range   WBC 7.1 4.0 - 10.5 K/uL   RBC 2.91 (L) 3.87 - 5.11 MIL/uL   Hemoglobin 8.6 (L) 12.0 - 15.0 g/dL   HCT 26.8 (L) 36.0 - 46.0 %   MCV 92.1 80.0 - 100.0 fL   MCH 29.6 26.0 - 34.0 pg   MCHC 32.1 30.0 - 36.0 g/dL   RDW 14.4 11.5 - 15.5 %   Platelets 269 150 - 400 K/uL   nRBC 0.0 0.0 - 0.2 %  Troponin I (High Sensitivity)     Status: Abnormal   Collection Time: 01/18/22  8:30 AM  Result Value Ref Range   Troponin I (High Sensitivity) 98 (H) <18 ng/L  Procalcitonin - Baseline     Status: None   Collection Time: 01/18/22  9:08 AM  Result Value Ref Range   Procalcitonin <0.10 ng/mL  Brain natriuretic peptide     Status: Abnormal   Collection Time: 01/18/22  9:08 AM  Result Value Ref Range   B Natriuretic Peptide 688.7 (H) 0.0 - 100.0 pg/mL  Lactic acid, plasma     Status: None   Collection Time: 01/18/22  9:08 AM  Result Value Ref Range   Lactic Acid, Venous 0.8 0.5 - 1.9 mmol/L  Troponin I (High Sensitivity)     Status: Abnormal   Collection Time: 01/18/22 10:43 AM  Result Value Ref Range   Troponin I (High Sensitivity) 110 (HH) <18 ng/L    I have Reviewed nursing notes, Vitals, and Lab results since pt's last encounter. Pertinent lab results : see above I have ordered test including BMP, CBC, Mg I have reviewed the last note from staff over past 24 hours I have discussed pt's care plan and test results with nursing staff, case manager   Advance Care Planning: Full   Consults: Nephrology  Family Communication: brother  Severity of Illness: The appropriate patient status for this patient is INPATIENT. Inpatient status is judged to be reasonable and necessary in order to provide  the required intensity of service to ensure the patient's safety. The patient's presenting symptoms, physical exam findings, and initial radiographic and  laboratory data in the context of their chronic comorbidities is felt to place them at high risk for further clinical deterioration. Furthermore, it is not anticipated that the patient will be medically stable for discharge from the hospital within 2 midnights of admission.   * I certify that at the point of admission it is my clinical judgment that the patient will require inpatient hospital care spanning beyond 2 midnights from the point of admission due to high intensity of service, high risk for further deterioration and high frequency of surveillance required.*  Author: Dwyane Dee, MD 01/18/2022 5:04 PM  For on call review [email or web address].

## 2022-01-18 NOTE — Assessment & Plan Note (Addendum)
-  patient has history of CKD5. Baseline creat ~ 3.4, eGFR 12 ?- patient presents with increase in creat >0.3 mg/dL above baseline, creat increase >1.5x baseline presumed to have occurred within past 7 days PTA ?-Given pattern, patient likely developing progressive renal failure requiring dialysis.  After thorough bedside discussion, patient declining dialysis and understands risks and benefits and able to explain them appropriately ?-Nephrology also following, greatly appreciate assistance ?- Trial of Lasix in efforts to relieve respiratory distress and assess renal response, and pt has actually responded really well to IV lasix with improved Cr ?Plan: ?--transition diuretic to torsemide 20 mg daily today per nephro ?

## 2022-01-18 NOTE — Hospital Course (Signed)
Carrie Craig is an 88 year old female with PMH DM II, CVA, history of myeloma, progressive CKD who presented with worsening shortness of breath and hypoxia at home. ?CXR was obtained which showed low lung volumes but concern for underlying pulmonary edema. ?Other notable labs included BUN 41, creatinine 4.45, CO2 20. ?WBC 7.1, hemoglobin 8.6 g/dL. ?She was afebrile and initial procalcitonin also negative. ?She was placed on oxygen in the ER due to acute hypoxia. ?Trending of her labs over the past 1 to 2 years showed progressively worsening renal function. ?Nephrology was consulted on admission as well.  Discussion also held bedside with patient and her brother; she was declining going on dialysis if needed and was able to explain consequences of not doing so to include imminent death if renal function continued to decline rapidly.  ?

## 2022-01-19 ENCOUNTER — Inpatient Hospital Stay (HOSPITAL_COMMUNITY)
Admit: 2022-01-19 | Discharge: 2022-01-19 | Disposition: A | Discharge status: Home / Self Care | Payer: Medicare HMO | Attending: Internal Medicine | Admitting: Internal Medicine

## 2022-01-19 DIAGNOSIS — Z7189 Other specified counseling: Secondary | ICD-10-CM

## 2022-01-19 DIAGNOSIS — J9601 Acute respiratory failure with hypoxia: Secondary | ICD-10-CM

## 2022-01-19 DIAGNOSIS — I10 Essential (primary) hypertension: Secondary | ICD-10-CM

## 2022-01-19 DIAGNOSIS — I5033 Acute on chronic diastolic (congestive) heart failure: Secondary | ICD-10-CM | POA: Diagnosis present

## 2022-01-19 LAB — PROCALCITONIN: Procalcitonin: <0.1 ng/mL

## 2022-01-19 LAB — HEPATITIS B CORE ANTIBODY, TOTAL

## 2022-01-19 LAB — KAPPA/LAMBDA LIGHT CHAINS
Kappa free light chain: 155.4 mg/L — ABNORMAL HIGH (ref 3.3–19.4)
Kappa, lambda light chain ratio: 0.96 (ref 0.26–1.65)
Lambda free light chains: 161.8 mg/L — ABNORMAL HIGH (ref 5.7–26.3)

## 2022-01-19 LAB — HEPATITIS B SURFACE ANTIBODY,QUALITATIVE

## 2022-01-19 LAB — PARATHYROID HORMONE, INTACT (NO CA): PTH: 41 pg/mL (ref 15–65)

## 2022-01-19 LAB — HEPATITIS B SURFACE ANTIGEN

## 2022-01-19 LAB — ECHOCARDIOGRAM COMPLETE
AR max vel: 2.73 cm2
AV Area VTI: 2.58 cm2
AV Area mean vel: 2.79 cm2
AV Mean grad: 3 mmHg
AV Peak grad: 5.9 mmHg
Ao pk vel: 1.21 m/s
Area-P 1/2: 3.79 cm2
Height: 57 in
MV VTI: 2.5 cm2
S' Lateral: 2.8 cm
Weight: 1888 oz

## 2022-01-19 LAB — HEPATITIS C ANTIBODY: HCV Ab: NONREACTIVE — AB

## 2022-01-19 MED ORDER — LORAZEPAM 2 MG/ML PO CONC: 1.0000 mg | ORAL | Status: DC | PRN

## 2022-01-19 MED ORDER — LORAZEPAM 1 MG PO TABS: 1.0000 mg | ORAL_TABLET | ORAL | Status: DC | PRN

## 2022-01-19 MED ORDER — ONDANSETRON HCL 4 MG/2ML IJ SOLN: 4.0000 mg | Freq: Four times a day (QID) | INTRAMUSCULAR | Status: DC | PRN

## 2022-01-19 MED ORDER — GLYCOPYRROLATE 1 MG PO TABS
1.0000 mg | ORAL_TABLET | ORAL | Status: DC | PRN
  Filled 2022-01-19: qty 1

## 2022-01-19 MED ORDER — BIOTENE DRY MOUTH MT LIQD: 15.0000 mL | OROMUCOSAL | Status: DC | PRN

## 2022-01-19 MED ORDER — LORAZEPAM 2 MG/ML IJ SOLN: 1.0000 mg | INTRAMUSCULAR | Status: DC | PRN

## 2022-01-19 MED ORDER — GLYCOPYRROLATE 0.2 MG/ML IJ SOLN
0.2000 mg | INTRAMUSCULAR | Status: DC | PRN
  Filled 2022-01-19: qty 1

## 2022-01-19 MED ORDER — POLYVINYL ALCOHOL 1.4 % OP SOLN
1.0000 [drp] | Freq: Four times a day (QID) | OPHTHALMIC | Status: DC | PRN
  Filled 2022-01-19: qty 15

## 2022-01-19 MED ORDER — HALOPERIDOL LACTATE 5 MG/ML IJ SOLN: 0.5000 mg | INTRAMUSCULAR | Status: DC | PRN

## 2022-01-19 MED ORDER — OXYCODONE HCL 5 MG PO TABS: 5.0000 mg | ORAL_TABLET | ORAL | Status: DC | PRN

## 2022-01-19 MED ORDER — HALOPERIDOL 0.5 MG PO TABS
0.5000 mg | ORAL_TABLET | ORAL | Status: DC | PRN
  Filled 2022-01-19: qty 1

## 2022-01-19 MED ORDER — HALOPERIDOL LACTATE 2 MG/ML PO CONC
0.5000 mg | ORAL | Status: DC | PRN
  Filled 2022-01-19: qty 0.3

## 2022-01-19 MED ORDER — ONDANSETRON 4 MG PO TBDP
4.0000 mg | ORAL_TABLET | Freq: Four times a day (QID) | ORAL | Status: DC | PRN
Start: 2022-01-19 — End: 2022-01-23

## 2022-01-19 NOTE — Consult Note (Addendum)
Consultation Note Date: 01/19/2022   Patient Name: Carrie Craig  DOB: September 12, 1932  MRN: [id number]  Age / Sex: 88 y.o., female  PCP: Dion Body, MD Referring Physician: Enzo Bi, MD  Reason for Consultation: Establishing goals of care  HPI/Patient Profile: Ms. Cockerham is an 88 year old female with PMH DM II, CVA, history of myeloma, progressive CKD who presented with worsening shortness of breath and hypoxia at home. CXR was obtained which showed low lung volumes but concern for underlying pulmonary edema. Other notable labs included BUN 41, creatinine 4.45, CO2 20.  Clinical Assessment and Goals of Care: Labs and notes reviewed. Patient is sitting in bed with brother at bedside.  She is alert and oriented.  She states she lives in an assisted living facility with 6 other individuals.  She is widowed and has no children.  Her brother is at bedside and she states he is her HPOA.  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   We discussed her diagnosis, prognosis, GOC, EOL wishes disposition and options.  A detailed discussion was had today regarding advanced directives.  Concepts specific to code status, artifical feeding and hydration, IV antibiotics and rehospitalization were discussed.  The difference between an aggressive medical intervention path and a comfort care path was discussed.  Values and goals of care important to patient and family were attempted to be elicited.  Discussed limitations of medical interventions to prolong quality of life in some situations and discussed the concept of human mortality.  Patient is able to articulate her diagnoses and current status;  she has been well updated.  She states she is aware that she needs dialysis to survive but has seen others undergo dialysis and she has made a firm decision that this is  not what she wants to do, particularly at 88 years old.  She states she just wants to focus on comfort and dignity "until the [facility]ita [facility] takes me home".  She is familiar with hospice and states that this is what she would want.  She would like to shift to comfort focused care.  I completed a MOST form today with patient, and with HPOA brother at bedside. The signed original was placed in the chart. A photocopy was placed in the chart to be scanned into EMR. The patient outlined their wishes for the following treatment decisions:  Cardiopulmonary Resuscitation: Do Not Attempt Resuscitation (DNR/No CPR)  Medical Interventions: Comfort Measures: Keep clean, warm, and dry. Use medication by any route, positioning, wound care, and other measures to relieve pain and suffering. Use oxygen, suction and manual treatment of airway obstruction as needed for comfort. Do not transfer to the hospital unless comfort needs cannot be met in current location.  Antibiotics: No antibiotics (use other measures to relieve symptoms)  IV Fluids: No IV fluids (provide other measures to ensure comfort)  Feeding Tube: No feeding tube      SUMMARY OF RECOMMENDATIONS    Patient would like  hospice facility placement.  With current care, patient has audible wheezing and states she has some shortness of breath.  She is eating a [employer] sausage and egg biscuit, and drinking fluids regularly.  Concern for symptom management burden outside of the hospital-agree with hospice facility request.  Meds added for symptom management.   Prognosis:  < 2 weeks        Primary Diagnoses: Present on Admission:  Acute renal failure superimposed on stage 5 chronic kidney disease, not on chronic dialysis ([place])   I have reviewed the medical record, interviewed the patient and family, and examined the patient. The following aspects are pertinent.  Past Medical History:  Diagnosis Date   Cancer ([place])    multiple myeloma   Diabetes  mellitus without complication ([place])    diet controlled   GERD (gastroesophageal reflux disease)    Stroke [facility])    Wears dentures    full upper and lower   Social History   Socioeconomic History   Marital status: Widowed    Spouse name: Not on file   Number of children: Not on file   Years of education: Not on file   Highest education level: Not on file  Occupational History   Not on file  Tobacco Use   Smoking status: Never   Smokeless tobacco: Never  Vaping Use   Vaping Use: Never used  Substance and Sexual Activity   Alcohol use: No   Drug use: No   Sexual activity: Not on file  Other Topics Concern   Not on file  Social History Narrative   used to live in [place] [until 1 year]; lives in [facility]. 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 Uses walker.    Social Determinants of Health   Financial Resource Strain: Not on file  Food Insecurity: Not on file  Transportation Needs: Not on file  Physical Activity: Not on file  Stress: Not on file  Social Connections: Not on file   History reviewed. No pertinent family history. Scheduled Meds:  aspirin EC  81 mg Oral Daily   clopidogrel  75 mg Oral Daily   furosemide  60 mg Intravenous Q12H   heparin  5,000 Units Subcutaneous Q8H   sodium chloride flush  3 mL Intravenous Q12H   traZODone  50 mg Oral QHS   Continuous Infusions: PRN Meds:.acetaminophen **OR** acetaminophen, albuterol, alum & mag hydroxide-simeth, morphine injection Medications Prior to Admission:  Prior to Admission medications   Medication Sig Start Date End Date Taking? Authorizing Provider  albuterol (VENTOLIN HFA) 108 (90 Base) MCG/ACT inhaler Inhale 2 puffs into the lungs every 4 (four) hours as needed for wheezing or shortness of breath.   Yes [provider]  amLODipine (NORVASC) 10 MG tablet Take 10 mg by mouth daily.   Yes [provider]  aspirin EC 81 MG tablet Take 81 mg by mouth daily.   Yes [provider]  atorvastatin (LIPITOR) 40 MG tablet Take 40 mg by mouth at bedtime.   Yes [provider]  benzonatate (TESSALON) 200 MG capsule Take 200 mg by mouth 3 (three) times daily as needed for cough.   Yes [provider]  Calcium Carbonate-Vitamin D3 600-400 MG-UNIT TABS Take 1 tablet by mouth 2 (two) times daily with a meal.   Yes [provider]  clopidogrel (PLAVIX) 75 MG tablet Take 75 mg by mouth daily.   Yes [provider]  dextromethorphan-guaiFENesin (MUCINEX DM) 30-600 MG 12hr tablet Take  1 tablet by mouth 2 (two) times daily as needed for cough.   Yes [provider]  ferrous sulfate 325 (65 FE) MG tablet Take 325 mg by mouth every other day.   Yes [provider]  loratadine (CLARITIN) 10 MG tablet Take 10 mg by mouth daily.   Yes [provider]  losartan (COZAAR) 25 MG tablet Take 25 mg by mouth every evening.   Yes [provider]  metoprolol succinate (TOPROL-XL) 25 MG 24 hr tablet Take 25 mg by mouth daily.   Yes [provider]  pantoprazole (PROTONIX) 40 MG tablet Take 40 mg by mouth daily.   Yes [provider]  torsemide (DEMADEX) 20 MG tablet Take 20 mg by mouth daily as needed.   Yes [provider]  traZODone (DESYREL) 50 MG tablet Take 50 mg by mouth at bedtime.   Yes [provider]   No Known Allergies Review of Systems  Respiratory:  Positive for shortness of breath and wheezing.    Physical Exam Pulmonary:     Comments: Audible wheezing Neurological:     Mental Status: She is alert.    Vital Signs: BP (!) 138/59 (BP Location: Right Arm)    Pulse 69    Temp 98 F (36.7 C) (Oral)    Resp 18    Ht 4' 9"  (1.448 m)    Wt 53.5 kg    SpO2 97%    BMI 25.53 kg/m  Pain Scale: 0-10   Pain Score: 0-No pain   SpO2: SpO2: 97 % O2 Device:SpO2: 97 % O2 Flow Rate: .O2 Flow Rate (L/min): 3 L/min  IO: Intake/output summary:  Intake/Output Summary (Last 24 hours) at  01/19/2022 1606 Last data filed at 01/19/2022 1017 Gross per 24 hour  Intake 358 ml  Output 400 ml  Net -42 ml    LBM: Last BM Date : 01/17/22 Baseline Weight: Weight: 53.5 kg Most recent weight: Weight: 53.5 kg      Signed by: Asencion Gowda, NP   Please contact Palliative Medicine Team phone at [phone] for questions and concerns.  For individual provider: See Shea Evans

## 2022-01-19 NOTE — Assessment & Plan Note (Addendum)
--  Home regimen of amlodipine, losartan, metoprolol and torsemide, held on presentation. ?Plan: ?--transition diuretic to torsemide 20 mg daily today per nephro ?--cont home amlodipine and Toprol  ?--hold home losartan for now due to AKI ?

## 2022-01-19 NOTE — Plan of Care (Signed)
?  Problem: Clinical Measurements: ?Goal: Ability to maintain clinical measurements within normal limits will improve ?Outcome: Progressing ?Goal: Will remain free from infection ?Outcome: Progressing ?Goal: Diagnostic test results will improve ?Outcome: Progressing ?Goal: Respiratory complications will improve ?Outcome: Progressing ?Goal: Cardiovascular complication will be avoided ?Outcome: Progressing ?  ?Pt is involved in and agrees with the plan of care. V/S stable. Alert and orientedx4. No complaints of pain. Still on oxygen at 3LPm/[place] with sats at 96%, ?

## 2022-01-19 NOTE — Progress Notes (Signed)
?Progress Note ? ? ?Patient: Carrie Craig BFX:[id number] DOB: November 08, 1932 DOA: 01/18/2022     1 ?DOS: the patient was seen and examined on 01/19/2022 ?  ?Brief hospital course: ?Carrie Craig is an 88 year old female with PMH DM II, CVA, history of myeloma, progressive CKD who presented with worsening shortness of breath and hypoxia at home. ?CXR was obtained which showed low lung volumes but concern for underlying pulmonary edema. ?Other notable labs included BUN 41, creatinine 4.45, CO2 20. ?WBC 7.1, hemoglobin 8.6 g/dL. ?She was afebrile and initial procalcitonin also negative. ?She was placed on oxygen in the ER due to acute hypoxia. ?Trending of her labs over the past 1 to 2 years showed progressively worsening renal function. ?Nephrology was consulted on admission as well.  Discussion also held bedside with patient and her brother; she was declining going on dialysis if needed and was able to explain consequences of not doing so to include imminent death if renal function continued to decline rapidly.  ? ?Assessment and Plan: ?* Acute renal failure superimposed on stage 5 chronic kidney disease, not on chronic dialysis [facility]) ?- patient has history of CKD5. Baseline creat ~ 3.4, eGFR 12 ?- patient presents with increase in creat >0.3 mg/dL above baseline, creat increase >1.5x baseline presumed to have occurred within past 7 days PTA ?-Given pattern, patient likely developing progressive renal failure requiring dialysis.  After thorough bedside discussion, patient declining dialysis and understands risks and benefits and able to explain them appropriately ?-Nephrology also following, greatly appreciate assistance ?- Trial of Lasix in efforts to relieve respiratory distress and assess renal response ?Plan: ?--cont IV lasix 60 mg q12h ? ?HTN (hypertension) ?--Home regimen of amlodipine, losartan, metoprolol and torsemide, currently held ?--cont IV diuresis for now ? ?Acute on chronic diastolic CHF (congestive heart  failure) ([place]) ?# mod pulm HTN ?--current Echo showed LVEF 60-65% with grade II DD and mod pulm HTN ?Plan: ?--cont IV lasix 60 mg q12h ? ? ?Normocytic anemia ?--Hgb 8.6 on presentation ?--anemia workup ? ?Acute respiratory failure with hypoxia ([place]) ?- Not on oxygen at home.  Etiology considered due to underlying pulmonary edema from renal failure.  Other considered differential includes cardiac in origin or contribution vs possible LLL infiltrate but low suspicion for infection at this time (no fever, WBC, negative PCT) therefore hold off on abx at this time ?Plan: ?--cont IV lasix 60 mg q12h ?-Continue O2 and wean as able ? ? ? ? ? ?Subjective:  ?Pt reported breathing better today, and having good urine output.   ? ?Pt appeared to have difficulty understanding goals of care discussion.  Per palliative care provider, pt wanted to be DNR and hospice facility placement, but said to me this morning that she "didn't want to die" and later told RN that she wanted to be resuscitated.  Code status reverted back to Full code. ? ? ?Physical Exam: ?Vitals:  ? 01/19/22 0403 01/19/22 0801 01/19/22 1637 01/19/22 2010  ?BP: (!) 151/78 (!) 138/59 (!) 141/62 (!) 142/63  ?Pulse: 69 69 77 70  ?Resp: 18 18  18   ?Temp: 98 ?F (36.7 ?C) 98 ?F (36.7 ?C) 98.3 ?F (36.8 ?C) 98.2 ?F (36.8 ?C)  ?TempSrc:  Oral  Oral  ?SpO2: 99% 97% 100% 100%  ?Weight:      ?Height:      ? ? ?Constitutional: NAD, alert, oriented to person and place ?HEENT: conjunctivae and lids normal, EOMI ?CV: No cyanosis.   ?RESP: normal respiratory effort, on 2L ?Extremities:  No effusions, edema in BLE ?SKIN: warm, dry ?Neuro: II - XII grossly intact.   ?Psych: Normal mood and affect.   ? ? ?Data Reviewed: ? ?Family Communication:  ? ?Disposition: ?Status is: Inpatient ?Remains inpatient appropriate because: pulm edema on IV lasix ? ? ? Planned Discharge Destination:  undetermined ? ? ? ? ?Time spent: 50 minutes ? ?Author: ?Enzo Bi, MD ?01/19/2022 9:24 PM ? ?For on call  review [email or web address].  ? ?

## 2022-01-19 NOTE — Assessment & Plan Note (Addendum)
#   mod pulm HTN ?--current Echo showed LVEF 60-65% with grade II DD and mod pulm HTN ?Plan: ?--transition diuretic to torsemide 20 mg daily today per nephro ? ?

## 2022-01-19 NOTE — Progress Notes (Signed)
*  PRELIMINARY RESULTS* ?Echocardiogram ?2D Echocardiogram has been performed. ? ?Venna Berberich, Sonia Side ?01/19/2022, 12:38 PM ?

## 2022-01-19 NOTE — Progress Notes (Signed)
Mobility Specialist - Progress Note ? ? 01/19/22 1600  ?Mobility  ?Activity Ambulated with assistance in room  ?Level of Assistance Modified independent, requires aide device or extra time  ?Assistive Device Front wheel walker  ?Distance Ambulated (ft) 20 ft  ?Activity Response Tolerated well  ?$Mobility charge 1 Mobility  ? ? ?Pre-mobility: 85 HR, 98% SpO2 ?Post-mobility: 101 HR, 92% SpO2 ? ? ?Pt lying in bed upon arrival, family at bedside. MinA to exit bed, modI to stand and ambulate with RW. Wheezing noted, O2 desat to 92% on 2L. Does voice generalized weakness. Distance limited by fatigue. Pt left semi-supine with alarm set, needs in reach.  ? ? ?Kathee Delton ?Mobility Specialist ?01/19/22, 4:36 PM ? ?

## 2022-01-19 NOTE — Progress Notes (Signed)
When I was explaining the DNR bracelet that I was going to put on the patient she said that that is not what she wanted. Dr Billie Ruddy notified and DNR discontinued ?

## 2022-01-19 NOTE — Progress Notes (Signed)
?[facility]  ?ROUNDING NOTE  ? ?Subjective:  ? ?[name] was admitted to [facility] on 01/18/2022 for SOB (shortness of breath) [R06.02] ?Acute on chronic kidney failure ([place]) [N17.9, N18.9] ?Acute respiratory failure with hypoxia ([place]) [J96.01] ? ?Patient was last seen by my partner, Dr. Murlean Craig, on 07/06/21 for chronic kidney disease. Patient at that time decided that she does not want dialysis.  ? ?Patient seen resting in bed, no family at bedside ?Alert and oriented ?Reports improved respiratory status with Lasix ? ?Creatinine 4.45 ?Recorded urine output of 200 mL overnight. ? ? ?Objective:  ?Vital signs in last 24 hours:  ?Temp:  [98 ?F (36.7 ?C)-98.3 ?F (36.8 ?C)] 98 ?F (36.7 ?C) (03/02 0801) ?Pulse Rate:  [69-99] 69 (03/02 0801) ?Resp:  [16-20] 18 (03/02 0801) ?BP: (138-165)/(59-86) 138/59 (03/02 0801) ?SpO2:  [96 %-100 %] 97 % (03/02 0801) ?Weight:  [53.5 kg] 53.5 kg (03/01 1836) ? ?Weight change:  ?Filed Weights  ? 01/18/22 1836  ?Weight: 53.5 kg  ? ? ?Intake/Output: ?I/O last 3 completed shifts: ?In: 118 [P.O.:118] ?Out: 200 [Urine:200] ?  ?Intake/Output this shift: ? Total I/O ?In: 240 [P.O.:240] ?Out: 200 [Urine:200] ? ?Physical Exam: ?General: NAD  ?Head: Normocephalic, atraumatic. Moist oral mucosal membranes  ?Eyes: Anicteric  ?Lungs:  Bilateral crackles, Superior O2  ?Heart: Regular rate and rhythm  ?Abdomen:  Soft, nontender  ?Extremities:  no peripheral edema.  ?Neurologic: Nonfocal, moving all four extremities  ?Skin: No lesions  ?Access: none  ? ? ?Basic Metabolic Panel: ?Recent Labs  ?Lab 01/18/22 ?0830 01/18/22 ?1350  ?NA 138  --   ?K 3.9  --   ?CL 106  --   ?CO2 20*  --   ?GLUCOSE 100*  --   ?BUN 41*  --   ?CREATININE 4.45*  --   ?CALCIUM 9.0  --   ?PHOS  --  3.7  ? ? ? ?Liver Function Tests: ?Recent Labs  ?Lab 01/18/22 ?1350  ?ALBUMIN 3.1*  ? ?No results for input(s): LIPASE, AMYLASE in the last 168 hours. ?No results for input(s): AMMONIA in the last 168 hours. ? ?CBC: ?Recent  Labs  ?Lab 01/18/22 ?0830  ?WBC 7.1  ?HGB 8.6*  ?HCT 26.8*  ?MCV 92.1  ?PLT 269  ? ? ? ?Cardiac Enzymes: ?No results for input(s): CKTOTAL, CKMB, CKMBINDEX, TROPONINI in the last 168 hours. ? ?BNP: ?Invalid input(s): POCBNP ? ?CBG: ?No results for input(s): GLUCAP in the last 168 hours. ? ?Microbiology: ?Results for orders placed or performed during the hospital encounter of 01/18/22  ?Culture, blood (Routine X 2) w Reflex to ID Panel     Status: None (Preliminary result)  ? Collection Time: 01/18/22  9:17 AM  ? Specimen: BLOOD  ?Result Value Ref Range Status  ? Specimen Description BLOOD RIGHT FA  Final  ? Special Requests   Final  ?  BOTTLES DRAWN AEROBIC AND ANAEROBIC Blood Culture adequate volume  ? Culture   Final  ?  NO GROWTH < 24 HOURS ?Performed at [facility], [address] [place], [place] [address] ?  ? Report Status PENDING  Incomplete  ?Culture, blood (Routine X 2) w Reflex to ID Panel     Status: None (Preliminary result)  ? Collection Time: 01/18/22  9:17 AM  ? Specimen: BLOOD  ?Result Value Ref Range Status  ? Specimen Description BLOOD LEFTH FA  Final  ? Special Requests   Final  ?  BOTTLES DRAWN AEROBIC AND ANAEROBIC Blood  Culture adequate volume  ? Culture   Final  ?  NO GROWTH < 24 HOURS ?Performed at [facility], [address], [place], [place] [address] ?  ? Report Status PENDING  Incomplete  ? ? ?Coagulation Studies: ?No results for input(s): LABPROT, INR in the last 72 hours. ? ?Urinalysis: ?No results for input(s): COLORURINE, LABSPEC, [place], GLUCOSEU, HGBUR, BILIRUBINUR, KETONESUR, PROTEINUR, UROBILINOGEN, NITRITE, LEUKOCYTESUR in the last 72 hours. ? ?Invalid input(s): APPERANCEUR  ? ? ?Imaging: ?DG Chest Port 1 View ? ?Result Date: 01/18/2022 ?CLINICAL DATA:  Worsening shortness of breath. EXAM: PORTABLE CHEST 1 VIEW COMPARISON:  September 14, 2021 FINDINGS: Enlarged cardiac silhouette. Calcific atherosclerotic disease and tortuosity of the aorta. Low lung volumes.  Diffuse bilateral linear and ground-glass opacities throughout both lungs. Possible left pleural effusion and/or more confluent consolidation in the left lung base. IMPRESSION: 1. Diffuse bilateral linear and ground-glass opacities throughout both lungs may represent mixed pattern pulmonary edema or multifocal, possibly atypical pneumonia with area of confluent consolidation in the left lung base. Electronically Signed   By: Fidela Salisbury M.D.   On: 01/18/2022 08:48   ? ? ?Medications:  ? ? ? aspirin EC  81 mg Oral Daily  ? clopidogrel  75 mg Oral Daily  ? furosemide  60 mg Intravenous Q12H  ? heparin  5,000 Units Subcutaneous Q8H  ? sodium chloride flush  3 mL Intravenous Q12H  ? traZODone  50 mg Oral QHS  ? ? ? ?Assessment/ Plan:  ?[name] is a 88 y.o. white female with MGUS, diabetes mellitus type II diet controlled, GERD, CVA, hypertension, insomnia who is admitted to [facility] on 01/18/2022 ?for SOB (shortness of breath) [R06.02] ?Acute on chronic kidney failure ([place]) [N17.9, N18.9] ?Acute respiratory failure with hypoxia ([place]) [J96.01]  ? ?Acute kidney injury on chronic kidney disease stage V verses progression of chronic kidney disease to end stage renal disease: baseline creatinine of 4.27, GFR of 10 on 12/05/2021. Patient does not want dialysis.  ?- Continue IV furosemide ?- palliative care consulted and establishing [place] ? ?Anemia with chronic kidney disease: with history of MGUS. History of requiring IV iron. Microcytic ?-Iron studies indicate anemia of chronic disease ?- patient may benefit from ESA ? ?Hypertension with chronic kidney disease: with echocardiogram from 12/17/2019 with preserved ejection fraction. Elevated 166/98 when examined. Volume driven. Home regimen of amlodipine, losartan, metoprolol and torsemide.  ?- recommend restarting home medications and restart torsemide when patient is closer to her dry weight.  ?- Continue current treatments at this time ? ? ? LOS: 1 ?[place] ?3/2/202311:34 AM ?  ?

## 2022-01-20 LAB — FOLATE: Folate: 4.2 ng/mL — ABNORMAL LOW (ref >5.9)

## 2022-01-20 LAB — VITAMIN B12: Vitamin B-12: 127 pg/mL — ABNORMAL LOW (ref 180–914)

## 2022-01-20 NOTE — Plan of Care (Signed)

## 2022-01-20 NOTE — Progress Notes (Signed)
?Progress Note ? ? ?Patient: Carrie Craig WUJ:[id number] DOB: 1932/07/31 DOA: 01/18/2022     2 ?DOS: the patient was seen and examined on 01/20/2022 ?  ?Brief hospital course: ?Ms. Skorupski is an 88 year old female with PMH DM II, CVA, history of myeloma, progressive CKD who presented with worsening shortness of breath and hypoxia at home. ?CXR was obtained which showed low lung volumes but concern for underlying pulmonary edema. ?Other notable labs included BUN 41, creatinine 4.45, CO2 20. ?WBC 7.1, hemoglobin 8.6 g/dL. ?She was afebrile and initial procalcitonin also negative. ?She was placed on oxygen in the ER due to acute hypoxia. ?Trending of her labs over the past 1 to 2 years showed progressively worsening renal function. ?Nephrology was consulted on admission as well.  Discussion also held bedside with patient and her brother; she was declining going on dialysis if needed and was able to explain consequences of not doing so to include imminent death if renal function continued to decline rapidly.  ? ?Assessment and Plan: ?* Acute respiratory failure with hypoxia ([place]) ?- Not on oxygen at home.  Etiology considered due to underlying pulmonary edema from renal failure.  Other considered differential includes cardiac in origin or contribution vs possible LLL infiltrate but low suspicion for infection at this time (no fever, WBC, negative PCT) therefore hold off on abx at this time ?Plan: ?--cont IV lasix 60 mg q12h ?-Continue O2 and wean as able ? ?HTN (hypertension) ?--Home regimen of amlodipine, losartan, metoprolol and torsemide, currently held ?--cont IV diuresis for now ? ?Acute on chronic diastolic CHF (congestive heart failure) ([place]) ?# mod pulm HTN ?--current Echo showed LVEF 60-65% with grade II DD and mod pulm HTN ?Plan: ?--cont IV lasix 60 mg q12h ? ? ?Anemia of chronic disease ?history of MGUS.  Microcytic ?--Hgb 8.6 on presentation ?--check folate and vit b12 ? ?Acute renal failure superimposed on stage  5 chronic kidney disease, not on chronic dialysis [facility]) ?- patient has history of CKD5. Baseline creat ~ 3.4, eGFR 12 ?- patient presents with increase in creat >0.3 mg/dL above baseline, creat increase >1.5x baseline presumed to have occurred within past 7 days PTA ?-Given pattern, patient likely developing progressive renal failure requiring dialysis.  After thorough bedside discussion, patient declining dialysis and understands risks and benefits and able to explain them appropriately ?-Nephrology also following, greatly appreciate assistance ?- Trial of Lasix in efforts to relieve respiratory distress and assess renal response ?Plan: ?--cont IV lasix 60 mg q12h ? ? ? ? ? ?Subjective:  ?Pt reported she made good amount of urine.  Pt also seems to be eating well. ? ?Pt has had a lot of back and forth about what she wants in terms of goals of care.  Today, palliative care provider talked with pt and brother, I went in right after and confirmed, so now, current status, pt is DNR, but continue with full treatment otherwise.  Pt would like to return home when medically ready. ? ? ?Physical Exam: ?Vitals:  ? 01/19/22 1637 01/19/22 2010 01/20/22 0610 01/20/22 0731  ?BP: (!) 141/62 (!) 142/63 (!) 156/52 (!) 156/63  ?Pulse: 77 70 61 63  ?Resp:  18 18 18   ?Temp: 98.3 ?F (36.8 ?C) 98.2 ?F (36.8 ?C) 97.6 ?F (36.4 ?C) 97.9 ?F (36.6 ?C)  ?TempSrc:  Oral    ?SpO2: 100% 100% 97% 98%  ?Weight:      ?Height:      ? ? ?Constitutional: NAD, AAOx3 ?HEENT: conjunctivae and lids  normal, EOMI ?CV: No cyanosis.   ?RESP: some gurgling, on 2L ?SKIN: warm, dry ?Neuro: II - XII grossly intact.   ?Psych: Normal mood and affect.   ? ? ?Data Reviewed: ? ?Family Communication: brother updated at bedside today ? ?Disposition: ?Status is: Inpatient ?Remains inpatient appropriate because: pulm edema on IV lasix ? ? ? Planned Discharge Destination: home ? ? ? ? ?Time spent: 50 minutes ? ?Author: ?Enzo Bi, MD ?01/20/2022 4:20 PM ? ?For on call review  [email or web address].  ? ?

## 2022-01-20 NOTE — Progress Notes (Addendum)
? ?                                                                                                                                                     ?                                                   ?Daily Progress Note  ? ?Patient Name: Carrie Craig       Date: 01/20/2022 ?DOB: Mar 19, 1932  Age: 88 y.o. MRN#: [id number] ?Attending Physician: Carrie Bi, MD ?Primary Care Physician: Carrie Body, MD ?Admit Date: 01/18/2022 ? ?Reason for Consultation/Follow-up: Establishing goals of care ? ?Subjective: ?Contacted by staff and primary team as patient is advising she would like full code/full scope treatment.  ? ?In to see patient and HPOA brother. She states she wants all care including to "be brought back" if her heart stops. She then proceeds to tell me she does not want dialysis as "I'm 89". She states "if I were in my 49's or 60's maybe". With this statement we discussed "being brought back". We discussed symptom management and we discussed issues with renal failure without dialysis. Brother discusses relatives and situations with them in regards to dialysis, hospice, and readmissions.   ? ?She states she does not want dialysis. With a great amount of discussion and questions answered, she states she would not want a breathing tube/machine breathing for you/life support/ ventilator support (all terms were used for understanding) and would not want CPR, or to "be brought back" (both terms used for her understanding) if her heart and breathing stop. She states she wants to be left peaceful and natural. She tells me she does want to return to the hospital for diuresis and symptom management as needed for life prolongation. She would want BIPAP. She would not want a feeding tube. She confirms her brother is HPOA. She states when she becomes confused and is unable to make decisions, she wants her brother to shift to full comfort care. All of the decisions were stated to me following open ended questions where she  articulated her wishes. ? ?New updated MOST form completed. ? ?I completed a MOST form today and the signed original was placed in the chart. A photocopy was placed in the chart to be scanned into EMR. The patient outlined their wishes for the following treatment decisions: ? ?Cardiopulmonary Resuscitation: Do Not Attempt Resuscitation (DNR/No CPR)  ?Medical Interventions: Limited Additional Interventions: Use medical treatment, IV fluids and cardiac monitoring as indicated, DO NOT USE intubation or mechanical ventilation. May consider use of less invasive airway support such as BiPAP or CPAP. Also provide comfort measures. Transfer to the hospital if  indicated. Avoid intensive care.   ?Antibiotics: Antibiotics if indicated  ?IV Fluids: IV fluids if indicated  ?Feeding Tube: No feeding tube  ?  ? ?Dr. Billie Craig updated on unit and in to see patient.  ? ?Length of Stay: 2 ? ?Current Medications: ?Scheduled Meds:  ?? furosemide  60 mg Intravenous Q12H  ?? sodium chloride flush  3 mL Intravenous Q12H  ?? traZODone  50 mg Oral QHS  ? ? ?Continuous Infusions: ? ? ?PRN Meds: ?acetaminophen **OR** acetaminophen, albuterol, alum & mag hydroxide-simeth, antiseptic oral rinse, glycopyrrolate **OR** glycopyrrolate **OR** glycopyrrolate, haloperidol **OR** haloperidol **OR** haloperidol lactate, LORazepam **OR** LORazepam **OR** LORazepam, morphine injection, ondansetron **OR** ondansetron (ZOFRAN) IV, oxyCODONE, polyvinyl alcohol ? ?Physical Exam ?Pulmonary:  ?   Comments: Audible wheezing ?Neurological:  ?   Mental Status: She is alert.  ?         ? ?Vital Signs: BP (!) 156/63 (BP Location: Right Arm)   Pulse 63   Temp 97.9 ?F (36.6 ?C)   Resp 18   Ht 4\' 9"  (1.448 m)   Wt 53.5 kg   SpO2 98%   BMI 25.53 kg/m?  ?SpO2: SpO2: 98 % ?O2 Device: O2 Device: Nasal Cannula ?O2 Flow Rate: O2 Flow Rate (L/min): 2 L/min ? ?Intake/output summary:  ?Intake/Output Summary (Last 24 hours) at 01/20/2022 1520 ?Last data filed at 01/20/2022  1406 ?Gross per 24 hour  ?Intake 485 ml  ?Output 1150 ml  ?Net -665 ml  ? ?LBM: Last BM Date : 01/17/22 ?Baseline Weight: Weight: 53.5 kg ?Most recent weight: Weight: 53.5 kg ? ? ? ?Patient Active Problem List  ? Diagnosis Date Noted  ?? Acute on chronic diastolic CHF (congestive heart failure) ([place]) 01/19/2022  ?? HTN (hypertension) 01/19/2022  ?? Acute renal failure superimposed on stage 5 chronic kidney disease, not on chronic dialysis ([place]) 01/18/2022  ?? Acute respiratory failure with hypoxia ([place]) 01/18/2022  ?? Normocytic anemia 01/18/2022  ?? Anemia due to stage 4 chronic kidney disease ([place]) 02/18/2020  ?? Anemia due to stage 3 chronic kidney disease ([place]) 05/31/2018  ?? UTI (urinary tract infection) 11/25/2015  ?? Thoracic compression fracture ([place]) 11/25/2015  ? ? ?Palliative Care Assessment & Plan  ? ? ?Recommendations/Plan: ?DNR/DNI. Treat the treatable.  ? ?Code Status: ? ?  ?Code Status Orders  ?(From admission, onward)  ?  ? ? ?  ? ?  Start     Ordered  ? 01/19/22 1755  Full code  Continuous       ? 01/19/22 1754  ? ?  ?  ? ?  ? ?Code Status History   ? ? Date Active Date Inactive Code Status Order ID Comments User Context  ? 01/19/2022 1619 01/19/2022 1754 DNR [id number]  Carrie Gowda, NP Inpatient  ? 01/18/2022 2027 01/19/2022 1619 Full Code [id number]  Dwyane Dee, MD Inpatient  ? 11/24/2015 0333 11/25/2015 2107 Full Code [id number]  Harrie Foreman, MD Inpatient  ? ?  ? ? ?Prognosis: ? < 3 months ? ? ?Care plan was discussed with primary MD ? ?Thank you for allowing the Palliative Medicine Team to assist in the care of this patient. ? ?Carrie Gowda, NP ? ?Please contact Palliative Medicine Team phone at [phone] for questions and concerns.  ? ? ? ? ? ?

## 2022-01-20 NOTE — Care Management Important Message (Signed)
Important Message ? ?Patient Details  ?Name: Carrie Craig ?MRN: [id number] ?Date of Birth: 09/21/1932 ? ? ?Medicare Important Message Given:  N/A - LOS <3 / Initial given by admissions ? ? ? ? ?[name] ?01/20/2022, 9:32 AM ?

## 2022-01-21 LAB — CBC
HCT: 24.3 % — ABNORMAL LOW (ref 36.0–46.0)
Hemoglobin: 7.7 g/dL — ABNORMAL LOW (ref 12.0–15.0)
MCH: 29.3 pg (ref 26.0–34.0)
MCHC: 31.7 g/dL (ref 30.0–36.0)
MCV: 92.4 fL (ref 80.0–100.0)
Platelets: 285 10*3/uL (ref 150–400)
RBC: 2.63 MIL/uL — ABNORMAL LOW (ref 3.87–5.11)
RDW: 13.7 % (ref 11.5–15.5)
WBC: 6.4 10*3/uL (ref 4.0–10.5)
nRBC: 0 % (ref 0.0–0.2)

## 2022-01-21 LAB — BASIC METABOLIC PANEL
Anion gap: 9 (ref 5–15)
BUN: 48 mg/dL — ABNORMAL HIGH (ref 8–23)
CO2: 27 mmol/L (ref 22–32)
Calcium: 8.6 mg/dL — ABNORMAL LOW (ref 8.9–10.3)
Chloride: 104 mmol/L (ref 98–111)
Creatinine, Ser: 4.18 mg/dL — ABNORMAL HIGH (ref 0.44–1.00)
GFR, Estimated: 10 mL/min — ABNORMAL LOW (ref >60)
Glucose, Bld: 86 mg/dL (ref 70–99)
Potassium: 4.2 mmol/L (ref 3.5–5.1)
Sodium: 140 mmol/L (ref 135–145)

## 2022-01-21 LAB — MAGNESIUM: Magnesium: 1.9 mg/dL (ref 1.7–2.4)

## 2022-01-21 MED ORDER — AMLODIPINE BESYLATE 10 MG PO TABS
10.0000 mg | ORAL_TABLET | Freq: Every day | ORAL | Status: DC
  Administered 2022-01-21 – 2022-01-23 (×3): 10 mg via ORAL
  Filled 2022-01-21 (×3): qty 1

## 2022-01-21 MED ORDER — FOLIC ACID 1 MG PO TABS
1.0000 mg | ORAL_TABLET | Freq: Every day | ORAL | Status: DC
  Administered 2022-01-21 – 2022-01-23 (×3): 1 mg via ORAL
  Filled 2022-01-21 (×3): qty 1

## 2022-01-21 MED ORDER — SODIUM CHLORIDE 0.9 % IV SOLN
510.0000 mg | Freq: Once | INTRAVENOUS | Status: AC
  Administered 2022-01-21: 510 mg via INTRAVENOUS
  Filled 2022-01-21: qty 17

## 2022-01-21 MED ORDER — VITAMIN B-12 1000 MCG PO TABS
1000.0000 ug | ORAL_TABLET | Freq: Every day | ORAL | Status: DC
  Administered 2022-01-21 – 2022-01-23 (×3): 1000 ug via ORAL
  Filled 2022-01-21 (×3): qty 1

## 2022-01-21 MED ORDER — METOPROLOL SUCCINATE ER 25 MG PO TB24
25.0000 mg | ORAL_TABLET | Freq: Every day | ORAL | Status: DC
  Administered 2022-01-21 – 2022-01-23 (×3): 25 mg via ORAL
  Filled 2022-01-21 (×3): qty 1

## 2022-01-21 NOTE — Progress Notes (Addendum)
[facility]  ROUNDING NOTE   Subjective:   [name] was admitted to [facility] on 01/18/2022 for SOB (shortness of breath) [R06.02] Acute on chronic kidney failure ([place]) [N17.9, N18.9] Acute respiratory failure with hypoxia ([place]) [J96.01]  Patient was last seen by my partner, Dr. Murlean Iba, on 07/06/21 for chronic kidney disease. Patient at that time decided that she does not want dialysis.   Patient seen sitting in chair Alert and oriented States she feels much better than when she arrived Tolerating meals without nausea and vomiting Remains on 2L [place]  Creatinine 4.18 Recorded urine output of 1.75L in 24 hours   Objective:  Vital signs in last 24 hours:  Temp:  [98.2 F (36.8 C)-99 F (37.2 C)] 99 F (37.2 C) (03/04 0759) Pulse Rate:  [71-73] 73 (03/04 0759) Resp:  [20] 20 (03/04 0759) BP: (178-180)/(53-63) 180/53 (03/04 0759) SpO2:  [99 %] 99 % (03/04 0759)  Weight change:  Filed Weights   01/18/22 1836  Weight: 53.5 kg    Intake/Output: I/O last 3 completed shifts: In: 845 [P.O.:840; I.V.:5] Out: 2600 [Urine:2600]   Intake/Output this shift:  No intake/output data recorded.  Physical Exam: General: NAD  Head: Normocephalic, atraumatic. Moist oral mucosal membranes  Eyes: Anicteric  Lungs:  Diminished in bases, [place] O2  Heart: Regular rate and rhythm  Abdomen:  Soft, nontender  Extremities:  no peripheral edema.  Neurologic: Nonfocal, moving all four extremities  Skin: No lesions  Access: none    Basic Metabolic Panel: Recent Labs  Lab 01/18/22 0830 01/18/22 1350 01/21/22 0358  NA 138  --  140  K 3.9  --  4.2  CL 106  --  104  CO2 20*  --  27  GLUCOSE 100*  --  86  BUN 41*  --  48*  CREATININE 4.45*  --  4.18*  CALCIUM 9.0  --  8.6*  MG  --   --  1.9  PHOS  --  3.7  --      Liver Function Tests: Recent Labs  Lab 01/18/22 1350  ALBUMIN 3.1*    No results for input(s): LIPASE, AMYLASE in the last 168 hours. No  results for input(s): AMMONIA in the last 168 hours.  CBC: Recent Labs  Lab 01/18/22 0830 01/21/22 0358  WBC 7.1 6.4  HGB 8.6* 7.7*  HCT 26.8* 24.3*  MCV 92.1 92.4  PLT 269 285     Cardiac Enzymes: No results for input(s): CKTOTAL, CKMB, CKMBINDEX, TROPONINI in the last 168 hours.  BNP: Invalid input(s): POCBNP  CBG: No results for input(s): GLUCAP in the last 168 hours.  Microbiology: Results for orders placed or performed during the hospital encounter of 01/18/22  Culture, blood (Routine X 2) w Reflex to ID Panel     Status: None (Preliminary result)   Collection Time: 01/18/22  9:17 AM   Specimen: BLOOD  Result Value Ref Range Status   Specimen Description BLOOD RIGHT FA  Final   Special Requests   Final    BOTTLES DRAWN AEROBIC AND ANAEROBIC Blood Culture adequate volume   Culture   Final    NO GROWTH 3 DAYS Performed at [facility], [address] [place], [place] [address]    Report Status PENDING  Incomplete  Culture, blood (Routine X 2) w Reflex to ID Panel     Status: None (Preliminary result)   Collection Time: 01/18/22  9:17 AM   Specimen: BLOOD  Result Value  Ref Range Status   Specimen Description BLOOD LEFTH FA  Final   Special Requests   Final    BOTTLES DRAWN AEROBIC AND ANAEROBIC Blood Culture adequate volume   Culture   Final    NO GROWTH 3 DAYS Performed at [facility], [address] [place], [place] [address]    Report Status PENDING  Incomplete    Coagulation Studies: No results for input(s): LABPROT, INR in the last 72 hours.  Urinalysis: No results for input(s): COLORURINE, LABSPEC, PHURINE, GLUCOSEU, HGBUR, BILIRUBINUR, KETONESUR, PROTEINUR, UROBILINOGEN, NITRITE, LEUKOCYTESUR in the last 72 hours.  Invalid input(s): APPERANCEUR    Imaging: ECHOCARDIOGRAM COMPLETE  Result Date: 01/19/2022    ECHOCARDIOGRAM REPORT   Patient Name:   Carrie Craig Date of Exam: 01/19/2022 Medical Rec #:  [id number]     Height:        57.0 in Accession #:    [id number]    Weight:       118.0 lb Date of Birth:  1932-03-08      BSA:          1.437 m Patient Age:    88 years      BP:           138/59 mmHg Patient Gender: F             HR:           69 bpm. Exam Location:  [facility] Procedure: 2D Echo, Cardiac Doppler and Color Doppler Indications:     Pulmonary Embolus  History:         Patient has no prior history of Echocardiogram examinations.                  Stroke; Risk Factors:Diabetes.  Sonographer:     Sherrie Sport Referring Phys:  [place] Diagnosing Phys: Ida Rogue MD  Sonographer Comments: Suboptimal apical window. IMPRESSIONS  1. Left ventricular ejection fraction, by estimation, is 60 to 65%. The left ventricle has normal function. The left ventricle has no regional wall motion abnormalities. Left ventricular diastolic parameters are consistent with Grade II diastolic dysfunction (pseudonormalization).  2. Right ventricular systolic function is normal. The right ventricular size is normal. There is moderately elevated pulmonary artery systolic pressure. The estimated right ventricular systolic pressure is 22.6 mmHg.  3. Left atrial size was moderately dilated.  4. The mitral valve is normal in structure. Moderate mitral valve regurgitation. No evidence of mitral stenosis.  5. Tricuspid valve regurgitation is moderate.  6. The aortic valve is tricuspid. Aortic valve regurgitation is mild. No aortic stenosis is present. FINDINGS  Left Ventricle: Left ventricular ejection fraction, by estimation, is 60 to 65%. The left ventricle has normal function. The left ventricle has no regional wall motion abnormalities. The left ventricular internal cavity size was normal in size. There is  no left ventricular hypertrophy. Left ventricular diastolic parameters are consistent with Grade II diastolic dysfunction (pseudonormalization). Right Ventricle: The right ventricular size is normal. No increase in right ventricular wall thickness.  Right ventricular systolic function is normal. There is moderately elevated pulmonary artery systolic pressure. The tricuspid regurgitant velocity is 3.19 m/s, and with an assumed right atrial pressure of 5 mmHg, the estimated right ventricular systolic pressure is 33.3 mmHg. Left Atrium: Left atrial size was moderately dilated. Right Atrium: Right atrial size was normal in size. Pericardium: There is no evidence of pericardial effusion. Mitral Valve: The mitral valve is normal in structure. Moderate mitral valve  regurgitation. No evidence of mitral valve stenosis. MV peak gradient, 5.7 mmHg. The mean mitral valve gradient is 2.0 mmHg. Tricuspid Valve: The tricuspid valve is normal in structure. Tricuspid valve regurgitation is moderate . No evidence of tricuspid stenosis. Aortic Valve: The aortic valve is tricuspid. Aortic valve regurgitation is mild. No aortic stenosis is present. Aortic valve mean gradient measures 3.0 mmHg. Aortic valve peak gradient measures 5.9 mmHg. Aortic valve area, by VTI measures 2.58 cm. Pulmonic Valve: The pulmonic valve was normal in structure. Pulmonic valve regurgitation is mild. No evidence of pulmonic stenosis. Aorta: The aortic root is normal in size and structure. Venous: The inferior vena cava was not well visualized. IAS/Shunts: No atrial level shunt detected by color flow Doppler.  LEFT VENTRICLE PLAX 2D LVIDd:         4.40 cm   Diastology LVIDs:         2.80 cm   LV e' medial:    3.59 cm/s LV PW:         0.70 cm   LV E/e' medial:  30.1 LV IVS:        1.00 cm   LV e' lateral:   7.29 cm/s LVOT diam:     2.00 cm   LV E/e' lateral: 14.8 LV SV:         69 LV SV Index:   48 LVOT Area:     3.14 cm  RIGHT VENTRICLE RV Basal diam:  3.00 cm RV S prime:     11.10 cm/s TAPSE (M-mode): 1.6 cm LEFT ATRIUM             Index        RIGHT ATRIUM           Index LA diam:        4.60 cm 3.20 cm/m   RA Area:     12.50 cm LA Vol (A2C):   51.3 ml 35.69 ml/m  RA Volume:   26.90 ml  18.72 ml/m  LA Vol (A4C):   51.6 ml 35.90 ml/m LA Biplane Vol: 55.0 ml 38.27 ml/m  AORTIC VALVE                    PULMONIC VALVE AV Area (Vmax):    2.73 cm     PV Vmax:        1.06 m/s AV Area (Vmean):   2.79 cm     PV Vmean:       74.300 cm/s AV Area (VTI):     2.58 cm     PV VTI:         0.218 m AV Vmax:           121.00 cm/s  PV Peak grad:   4.5 mmHg AV Vmean:          80.800 cm/s  PV Mean grad:   3.0 mmHg AV VTI:            0.267 m      RVOT Peak grad: 2 mmHg AV Peak Grad:      5.9 mmHg AV Mean Grad:      3.0 mmHg LVOT Vmax:         105.00 cm/s LVOT Vmean:        71.700 cm/s LVOT VTI:          0.219 m LVOT/AV VTI ratio: 0.82  AORTA Ao Root diam: 3.10 cm MITRAL VALVE  TRICUSPID VALVE MV Area (PHT): 3.79 cm     TR Peak grad:   40.7 mmHg MV Area VTI:   2.50 cm     TR Vmax:        319.00 cm/s MV Peak grad:  5.7 mmHg MV Mean grad:  2.0 mmHg     SHUNTS MV Vmax:       1.19 m/s     Systemic VTI:  0.22 m MV Vmean:      58.4 cm/s    Systemic Diam: 2.00 cm MV Decel Time: 200 msec     Pulmonic VTI:  0.152 m MV E velocity: 108.00 cm/s MV A velocity: 83.60 cm/s MV E/A ratio:  1.29 Ida Rogue MD Electronically signed by Ida Rogue MD Signature Date/Time: 01/19/2022/12:56:11 PM    Final      Medications:     furosemide  60 mg Intravenous Q12H   sodium chloride flush  3 mL Intravenous Q12H   traZODone  50 mg Oral QHS     Assessment/ Plan:  Ms. LASHONDA SONNEBORN is a 88 y.o. white female with MGUS, diabetes mellitus type II diet controlled, GERD, CVA, hypertension, insomnia who is admitted to [facility] on 01/18/2022 for SOB (shortness of breath) [R06.02] Acute on chronic kidney failure ([place]) [N17.9, N18.9] Acute respiratory failure with hypoxia ([place]) [J96.01]   Acute kidney injury on chronic kidney disease stage V verses progression of chronic kidney disease to end stage renal disease: baseline creatinine of 4.27, GFR of 10 on 12/05/2021. Patient does not want dialysis.  - Continue IV furosemide - Home  regimen includes Torsemide '20mg'$  twice weekly. May require increased frequency or daily low dose diuretic at discharge.   Anemia with chronic kidney disease: with history of MGUS. History of requiring IV iron. Microcytic -Iron studies  indicate Iron deficiency anemia    Latest Reference Range & Units 01/18/22 13:50 01/20/22 16:31  Iron 28 - 170 ug/dL 15 (L)   UIBC ug/dL 212   TIBC 250 - 450 ug/dL 227 (L)   Saturation Ratios 10.4 - 31.8 % 7 (L)   Ferritin 11 - 307 ng/mL 143   Folate >5.9 ng/mL  4.2 (L)  (L): Data is abnormally low  Folate deficiency as well  - Hgb 7.7  Hypertension with chronic kidney disease: with echocardiogram from 12/17/2019 with preserved ejection fraction. Elevated 166/98 when examined. Volume driven. Home regimen of amlodipine, losartan, metoprolol and torsemide.  - recommend restarting home medications and restart torsemide when patient is closer to her dry weight.    PLan  Will start PO folate Will give IV Iron Will suggest to start Torsemide  '20mg'$  po daily at the time of discharge    Addendum I saw the pt independently and discussed the case with Ms Gwyneth Revels.     LOS: 3 Shantelle Breeze 3/4/202311:48 AM

## 2022-01-21 NOTE — Progress Notes (Signed)
?Progress Note ? ? ?Patient: Carrie Craig YKZ:[id number] DOB: [birth year] DOA: 01/18/2022     3 ?DOS: the patient was seen and examined on 01/21/2022 ?  ?Brief hospital course: ?Ms. Komperda is an 88 year old female with PMH DM II, CVA, history of myeloma, progressive CKD who presented with worsening shortness of breath and hypoxia at home. ?CXR was obtained which showed low lung volumes but concern for underlying pulmonary edema. ?Other notable labs included BUN 41, creatinine 4.45, CO2 20. ?WBC 7.1, hemoglobin 8.6 g/dL. ?She was afebrile and initial procalcitonin also negative. ?She was placed on oxygen in the ER due to acute hypoxia. ?Trending of her labs over the past 1 to 2 years showed progressively worsening renal function. ?Nephrology was consulted on admission as well.  Discussion also held bedside with patient and her brother; she was declining going on dialysis if needed and was able to explain consequences of not doing so to include imminent death if renal function continued to decline rapidly.  ? ?Assessment and Plan: ?* Acute respiratory failure with hypoxia ([place]) ?- Not on oxygen at home.  Etiology considered due to underlying pulmonary edema from renal failure.  Other considered differential includes cardiac in origin or contribution vs possible LLL infiltrate but low suspicion for infection at this time (no fever, WBC, negative PCT) therefore hold off on abx at this time ?Plan: ?--cont IV lasix 60 mg q12h ?--Continue supplemental O2 to keep sats between 88-92%, wean as tolerated ? ? ?HTN (hypertension) ?--Home regimen of amlodipine, losartan, metoprolol and torsemide, held on presentation. ?Plan: ?--cont IV diuresis  ?--resume home amlodipine and Toprol today ?--hold home losartan for now due to AKI ? ?Acute on chronic diastolic CHF (congestive heart failure) ([place]) ?# mod pulm HTN ?--current Echo showed LVEF 60-65% with grade II DD and mod pulm HTN ?Plan: ?--cont IV lasix 60 mg q12h ? ? ?Anemia of chronic  disease ?history of MGUS.  Microcytic  ?--Hgb 8.6 on presentation ?--folate and Vit B12 both low ?Plan: ?--IV iron, per nephrology ?--start folic acid and vit L39 supplements ? ?Acute renal failure superimposed on stage 5 chronic kidney disease, not on chronic dialysis [facility]) ?- patient has history of CKD5. Baseline creat ~ 3.4, eGFR 12 ?- patient presents with increase in creat >0.3 mg/dL above baseline, creat increase >1.5x baseline presumed to have occurred within past 7 days PTA ?-Given pattern, patient likely developing progressive renal failure requiring dialysis.  After thorough bedside discussion, patient declining dialysis and understands risks and benefits and able to explain them appropriately ?-Nephrology also following, greatly appreciate assistance ?- Trial of Lasix in efforts to relieve respiratory distress and assess renal response, and pt has actually responded really well to IV lasix. ?Plan: ?--cont IV lasix 60 mg q12h ? ? ? ? ? ?Subjective:  ?Pt was noted to have good appetite and good urine output.   ? ?PT walked pt and pt needed 1L O2. ? ? ?Physical Exam: ?Vitals:  ? 01/20/22 1913 01/21/22 0759 01/21/22 1514 01/21/22 1538  ?BP: (!) 178/63 (!) 180/53 (!) 168/66 (!) 177/81  ?Pulse: 71 73 81 83  ?Resp: 20 20  16   ?Temp: 98.2 ?F (36.8 ?C) 99 ?F (37.2 ?C) 98 ?F (36.7 ?C) 98.2 ?F (36.8 ?C)  ?TempSrc: Oral Oral Oral Oral  ?SpO2: 99% 99% 100% 99%  ?Weight:      ?Height:      ? ? ?Constitutional: NAD, AAOx3, sitting in recliner ?HEENT: conjunctivae and lids normal, EOMI ?CV: No cyanosis.   ?  RESP: normal respiratory effort, on 1L ?Extremities: No effusions, edema in BLE ?SKIN: warm, dry ?Neuro: II - XII grossly intact.   ?Psych: Normal mood and affect.   ? ? ?Data Reviewed: ? ?Family Communication: brother updated at bedside today ? ?Disposition: ?Status is: Inpatient ?Remains inpatient appropriate because: pulm edema on IV lasix ? ? ? Planned Discharge Destination: home ? ? ? ? ?Time spent: 50  minutes ? ?Author: ?Enzo Bi, MD ?01/21/2022 5:47 PM ? ?For on call review [email or web address].  ? ?

## 2022-01-21 NOTE — Plan of Care (Signed)
  Problem: Health Behavior/Discharge Planning: Goal: Ability to manage health-related needs will improve Outcome: Progressing   

## 2022-01-21 NOTE — Evaluation (Addendum)
Physical Therapy Evaluation ?Patient Details ?Name: Carrie Craig ?MRN: [id number] ?DOB: 09/29/1932 ?Today's Date: 01/21/2022 ? ?History of Present Illness ? Pt admitted to [facility] on 01/18/22 from [facility] for c/o worsening SOB with SpO2 at 88% on RA; SpO2 improved with albuterol breathing treatments. Significant PMH includes: T2DM, hx CVA, hx myeloma, progressive CKD. CXR revealed low lung volumtes with concern for pulmonary edema. ?  ?Clinical Impression ? Pt is a 88 year old F admitted to hospital on 01/18/22 for acute respiratory failure with hypoxia. At baseline, pt is Mod I for limited ambulation with RW, dressing, and toileting; requires assist for IADL's, bathing, transportation, and medication management. Pt presents with generalized weakness (still WFL), decreased standing balance, increased O2 dependence from baseline, and decreased activity tolerance, resulting in impaired functional mobility from baseline. Due to deficits, pt required supervision for bed mobility, transfers, and gait. Pt able to ambulate 78f on RA demonstrating labored breathing and delayed desaturation to 88%. Pt unable to recover >92% despite seated rest break and cues for PLB; ultimately required 1L O2 via [place] for recovery. Pt left on 1L via [place]; RN notified. Deficits limit the pt's ability to safely and independently perform ADL's, transfer, and ambulate. Pt will benefit from acute skilled PT services to address deficits for return to baseline function. At this time, PT recommends HHPT at DC to address deficits and improve overall safety with functional mobility for return to baseline function. Will require home O2. ?   ?   ? ?Recommendations for follow up therapy are one component of a multi-disciplinary discharge planning process, led by the attending physician.  Recommendations may be updated based on patient status, additional functional criteria and insurance authorization. ? ?Follow Up Recommendations Home health  PT ? ?  ?Assistance Recommended at Discharge Intermittent Supervision/Assistance  ?Patient can return home with the following ? A little help with walking and/or transfers;A little help with bathing/dressing/bathroom;Assistance with cooking/housework ? ?  ?Equipment Recommendations  (home O2)  ?   ?Functional Status Assessment Patient has had a recent decline in their functional status and demonstrates the ability to make significant improvements in function in a reasonable and predictable amount of time.  ? ?  ?Precautions / Restrictions Precautions ?Precautions: Fall ?Precaution Comments: fluid restriction 12067m?Restrictions ?Weight Bearing Restrictions: No  ? ?  ? ?Mobility ? Bed Mobility ?Overal bed mobility: Needs Assistance ?Bed Mobility: Supine to Sit ?  ?  ?Supine to sit: Supervision, HOB elevated ?  ?  ?General bed mobility comments: for safety to sit EOB, increased time/effort, HOB elevated, use of BUE for support ?  ? ?Transfers ?Overall transfer level: Needs assistance ?Equipment used: Rolling walker (2 wheels) ?Transfers: Sit to/from Stand ?Sit to Stand: Supervision, From elevated surface ?  ?  ?  ?  ?  ?General transfer comment: for safety to perform STS transfers from EOB and recliner; cues for hand placement ?  ? ?Ambulation/Gait ?Ambulation/Gait assistance: Supervision ?Gait Distance (Feet): 50 Feet ?Assistive device: Rolling walker (2 wheels) ?  ?  ?  ?  ?General Gait Details: Supervision for safety to ambulate with RW. Demonstrates slowed cadence, decreased step length/foot clearance bil, early reciprocal gait, and labored breathing. Desat to 88% on RA during gait, requiring 1L O2 via  for recovery >92%. ? ?  ? ?Balance Overall balance assessment: Needs assistance ?Sitting-balance support: No upper extremity supported, Feet supported ?Sitting balance-Leahy Scale: Good ?  ?  ?  ?Standing balance-Leahy Scale: Fair ?Standing  balance comment: BUE support on RW ?  ?  ?  ?  ?  ?  ?  ?  ?  ?  ?  ?    ? ? ? ?Pertinent Vitals/Pain Pain Assessment ?Pain Assessment: No/denies pain  ? ? ?Home Living Family/patient expects to be discharged to:: Other (Comment) (back to [place]) ?  ?  ?  ?  ?  ?  ?  ?  ?  ?   ?  ?Prior Function Prior Level of Function : Independent/Modified Independent;History of Falls (last six months) ?  ?  ?  ?  ?  ?  ?Mobility Comments: Mod I for limited ambulation with RW; group home assists with transportation. Does not wear O2 at baseline. ?ADLs Comments: Grossly mod I for ADL's, but requires assist for bathing (Every other day), medication management, and IADL's. ?  ? ? ?Hand Dominance  ? Dominant Hand: Right ? ?  ?Extremity/Trunk Assessment  ? Upper Extremity Assessment ?Upper Extremity Assessment: Overall WFL for tasks assessed;Generalized weakness (Observed to be at least 3+/5; sensation intact) ?  ? ?Lower Extremity Assessment ?Lower Extremity Assessment: Overall WFL for tasks assessed;Generalized weakness (Observed to be at least 3+/5; sensation intact) ?  ? ?Cervical / Trunk Assessment ?Cervical / Trunk Assessment: Normal  ?Communication  ? Communication: No difficulties  ?Cognition Arousal/Alertness: Awake/alert ?Behavior During Therapy: [facility] for tasks assessed/performed ?Overall Cognitive Status: Within Functional Limits for tasks assessed ?  ?  ?  ?  ?  ?  ?  ?  ?  ?  ?  ?  ?  ?  ?  ?  ?General Comments: A&O x4; able to follow 100% of simple 3-step commands. ?  ?  ? ?  ?General Comments General comments (skin integrity, edema, etc.): 2L O2 via [place] upon entry with SpO2 at 97%; able to wean to RA for gait trial, ambulating 56f and desat to 88%. Required 1L O2 via [place] for recovery >92%. ? ?  ?Exercises Other Exercises ?Other Exercises: Bed mobility, transfers, and gait with RW. Required 1L. Educated re: PT role/POC, DC recommendations, safety with mobility, O2 needs, OOB mobility/toileting. She verbalized understanding.  ? ?Assessment/Plan  ?  ?PT Assessment Patient needs continued PT  services  ?PT Problem List Decreased strength;Decreased activity tolerance;Decreased balance;Cardiopulmonary status limiting activity ? ?   ?  ?PT Treatment Interventions Gait training;Functional mobility training;Therapeutic activities;Therapeutic exercise;Balance training;Neuromuscular re-education   ? ?PT Goals (Current goals can be found in the Care Plan section)  ?Acute Rehab PT Goals ?Patient Stated Goal: "go home" ?PT Goal Formulation: With patient ?Time For Goal Achievement: 02/04/22 ?Potential to Achieve Goals: Good ? ?  ?Frequency Min 2X/week ?  ? ? ?   ?AM-PAC PT "6 Clicks" Mobility  ?Outcome Measure Help needed turning from your back to your side while in a flat bed without using bedrails?: A Little ?Help needed moving from lying on your back to sitting on the side of a flat bed without using bedrails?: A Little ?Help needed moving to and from a bed to a chair (including a wheelchair)?: A Little ?Help needed standing up from a chair using your arms (e.g., wheelchair or bedside chair)?: A Little ?Help needed to walk in hospital room?: A Little ?Help needed climbing 3-5 steps with a railing? : A Little ?6 Click Score: 18 ? ?  ?End of Session Equipment Utilized During Treatment: Gait belt;Oxygen ?Activity Tolerance: Patient tolerated treatment well;Patient limited by fatigue ?Patient left: in chair;with call bell/phone  within reach;with chair alarm set ?Nurse Communication: Mobility status (O2 needs) ?PT Visit Diagnosis: Unsteadiness on feet (R26.81);Muscle weakness (generalized) (M62.81) ?  ? ?Time: 3343-5686 ?PT Time Calculation (min) (ACUTE ONLY): 21 min ? ? ?Charges:   PT Evaluation ?$PT Eval Low Complexity: 1 Low ?PT Treatments ?$Therapeutic Exercise: 8-22 mins ?  ?   ? ?Herminio Commons, PT, DPT ?2:20 PM,01/21/22 ? ? ?

## 2022-01-22 LAB — BASIC METABOLIC PANEL
Anion gap: 11 (ref 5–15)
BUN: 58 mg/dL — ABNORMAL HIGH (ref 8–23)
CO2: 28 mmol/L (ref 22–32)
Calcium: 9.5 mg/dL (ref 8.9–10.3)
Chloride: 100 mmol/L (ref 98–111)
Creatinine, Ser: 3.89 mg/dL — ABNORMAL HIGH (ref 0.44–1.00)
GFR, Estimated: 11 mL/min — ABNORMAL LOW (ref >60)
Glucose, Bld: 98 mg/dL (ref 70–99)
Potassium: 4.1 mmol/L (ref 3.5–5.1)
Sodium: 139 mmol/L (ref 135–145)

## 2022-01-22 LAB — CBC
HCT: 27.9 % — ABNORMAL LOW (ref 36.0–46.0)
Hemoglobin: 9.1 g/dL — ABNORMAL LOW (ref 12.0–15.0)
MCH: 29.4 pg (ref 26.0–34.0)
MCHC: 32.6 g/dL (ref 30.0–36.0)
MCV: 90 fL (ref 80.0–100.0)
Platelets: 300 10*3/uL (ref 150–400)
RBC: 3.1 MIL/uL — ABNORMAL LOW (ref 3.87–5.11)
RDW: 13.6 % (ref 11.5–15.5)
WBC: 6.8 10*3/uL (ref 4.0–10.5)
nRBC: 0 % (ref 0.0–0.2)

## 2022-01-22 LAB — MAGNESIUM: Magnesium: 1.9 mg/dL (ref 1.7–2.4)

## 2022-01-22 MED ORDER — TORSEMIDE 20 MG PO TABS
20.0000 mg | ORAL_TABLET | Freq: Every day | ORAL | Status: DC
  Administered 2022-01-22 – 2022-01-23 (×2): 20 mg via ORAL
  Filled 2022-01-22 (×2): qty 1

## 2022-01-22 NOTE — Progress Notes (Addendum)
?[facility]  ?ROUNDING NOTE  ? ?Subjective:  ? ?[name] was admitted to [facility] on 01/18/2022 for SOB (shortness of breath) [R06.02] ?Acute on chronic kidney failure ([place]) [N17.9, N18.9] ?Acute respiratory failure with hypoxia ([place]) [J96.01] ? ?Patient was last seen by my partner, Dr. Murlean Iba, on 07/06/21 for chronic kidney disease. Patient at that time decided that she does not want dialysis.  ? ?Patient seen resting comfortably ?Tolerating meals ?Denies shortness of breath, remains on 2 L nasal cannula ? ?Creatinine 3.89 (4.18) ?Recorded urine output of 2.4L in 24 hours ? ? ?Objective:  ?Vital signs in last 24 hours:  ?Temp:  [97.8 ?F (36.6 ?C)-98.6 ?F (37 ?C)] 98.6 ?F (37 ?C) (03/05 0803) ?Pulse Rate:  [65-83] 68 (03/05 0803) ?Resp:  [16-18] 18 (03/05 0803) ?BP: (159-177)/(57-86) 163/57 (03/05 0803) ?SpO2:  [97 %-100 %] 97 % (03/05 0803) ? ?Weight change:  ?Filed Weights  ? 01/18/22 1836  ?Weight: 53.5 kg  ? ? ?Intake/Output: ?I/O last 3 completed shifts: ?In: -  ?Out: 3250 [[id number]] ?  ?Intake/Output this shift: ? No intake/output data recorded. ? ?Physical Exam: ?General: NAD  ?Head: Normocephalic, atraumatic. Moist oral mucosal membranes  ?Eyes: Anicteric  ?Lungs:  Diminished in bases, [place] O2  ?Heart: Regular rate and rhythm  ?Abdomen:  Soft, nontender  ?Extremities:  no peripheral edema.  ?Neurologic: Nonfocal, moving all four extremities  ?Skin: No lesions  ?Access: none  ? ? ?Basic Metabolic Panel: ?Recent Labs  ?Lab 01/18/22 ?0830 01/18/22 ?1350 01/21/22 ?0358 01/22/22 ?8338  ?NA 138  --  140 139  ?K 3.9  --  4.2 4.1  ?CL 106  --  104 100  ?CO2 20*  --  27 28  ?GLUCOSE 100*  --  86 98  ?BUN 41*  --  48* 58*  ?CREATININE 4.45*  --  4.18* 3.89*  ?CALCIUM 9.0  --  8.6* 9.5  ?MG  --   --  1.9 1.9  ?PHOS  --  3.7  --   --   ? ? ? ?Liver Function Tests: ?Recent Labs  ?Lab 01/18/22 ?1350  ?ALBUMIN 3.1*  ? ? ?No results for input(s): LIPASE, AMYLASE in the last 168 hours. ?No results for  input(s): AMMONIA in the last 168 hours. ? ?CBC: ?Recent Labs  ?Lab 01/18/22 ?0830 01/21/22 ?0358 01/22/22 ?2505  ?WBC 7.1 6.4 6.8  ?HGB 8.6* 7.7* 9.1*  ?HCT 26.8* 24.3* 27.9*  ?MCV 92.1 92.4 90.0  ?PLT 269 285 300  ? ? ? ?Cardiac Enzymes: ?No results for input(s): CKTOTAL, CKMB, CKMBINDEX, TROPONINI in the last 168 hours. ? ?BNP: ?Invalid input(s): POCBNP ? ?CBG: ?No results for input(s): GLUCAP in the last 168 hours. ? ?Microbiology: ?Results for orders placed or performed during the hospital encounter of 01/18/22  ?Culture, blood (Routine X 2) w Reflex to ID Panel     Status: None (Preliminary result)  ? Collection Time: 01/18/22  9:17 AM  ? Specimen: BLOOD  ?Result Value Ref Range Status  ? Specimen Description BLOOD RIGHT FA  Final  ? Special Requests   Final  ?  BOTTLES DRAWN AEROBIC AND ANAEROBIC Blood Culture adequate volume  ? Culture   Final  ?  NO GROWTH 4 DAYS ?Performed at [facility], [address] [place], [place] [address] ?  ? Report Status PENDING  Incomplete  ?Culture, blood (Routine X 2) w Reflex to ID Panel     Status: None (Preliminary result)  ? Collection Time:  01/18/22  9:17 AM  ? Specimen: BLOOD  ?Result Value Ref Range Status  ? Specimen Description BLOOD LEFTH FA  Final  ? Special Requests   Final  ?  BOTTLES DRAWN AEROBIC AND ANAEROBIC Blood Culture adequate volume  ? Culture   Final  ?  NO GROWTH 4 DAYS ?Performed at [facility], [address], [place], [place] [address] ?  ? Report Status PENDING  Incomplete  ? ? ?Coagulation Studies: ?No results for input(s): LABPROT, INR in the last 72 hours. ? ?Urinalysis: ?No results for input(s): COLORURINE, LABSPEC, [place], GLUCOSEU, HGBUR, BILIRUBINUR, KETONESUR, PROTEINUR, UROBILINOGEN, NITRITE, LEUKOCYTESUR in the last 72 hours. ? ?Invalid input(s): APPERANCEUR  ? ? ?Imaging: ?No results found. ? ? ?Medications:  ? ? ? amLODipine  10 mg Oral Daily  ? folic acid  1 mg Oral Daily  ? metoprolol succinate  25 mg Oral  Daily  ? sodium chloride flush  3 mL Intravenous Q12H  ? torsemide  20 mg Oral Daily  ? traZODone  50 mg Oral QHS  ? vitamin B-12  1,000 mcg Oral Daily  ? ? ? ?Assessment/ Plan:  ?[name] is a 88 y.o. white female with MGUS, diabetes mellitus type II diet controlled, GERD, CVA, hypertension, insomnia who is admitted to [facility] on 01/18/2022 ?for SOB (shortness of breath) [R06.02] ?Acute on chronic kidney failure ([place]) [N17.9, N18.9] ?Acute respiratory failure with hypoxia ([place]) [J96.01]  ? ?Acute kidney injury on chronic kidney disease stage V verses progression of chronic kidney disease to end stage renal disease: baseline creatinine of 4.27, GFR of 10 on 12/05/2021. Patient does not want dialysis.  ?-Stopped IV Lasix ?-We will start torsemide 20 mg daily p.o. ?-Patient will require close follow-up in office at discharge. ? ?Anemia with chronic kidney disease: with history of MGUS. History of requiring IV iron. Microcytic ?-Iron studies  indicate Iron deficiency anemia ? ? ? Latest Reference Range & Units 01/18/22 13:50 01/20/22 16:31  ?Iron 28 - 170 ug/dL 15 (L)   ?UIBC ug/dL 212   ?TIBC 250 - 450 ug/dL 227 (L)   ?Saturation Ratios 10.4 - 31.8 % 7 (L)   ?Ferritin 11 - 307 ng/mL 143   ?Folate >5.9 ng/mL  4.2 (L)  ?(L): Data is abnormally low ? ?Folate deficiency as well  ?-Hemoglobin improved to 9.1 with IV and p.o. iron given yesterday. ? ?Hypertension with chronic kidney disease: with echocardiogram from 12/17/2019 with preserved ejection fraction. Elevated 166/98 when examined. Volume driven. Home regimen of amlodipine, losartan, metoprolol and torsemide.  ?-Amlodipine, metoprolol, and torsemide currently prescribed. ? ? ?PLan ?IV furosemide stopped ?Will suggest to start Torsemide  '20mg'$  po daily  ?We will schedule outpatient follow-up with nephrology at discharge ? ? ? ? LOS: 4 ?[place] ?3/5/202311:14 AM ?  ?

## 2022-01-22 NOTE — Progress Notes (Signed)
?Progress Note ? ? ?Patient: Carrie Craig SXJ:[id number] DOB: Aug 06, 1932 DOA: 01/18/2022     4 ?DOS: the patient was seen and examined on 01/22/2022 ?  ?Brief hospital course: ?Carrie Craig is an 88 year old female with PMH DM II, CVA, history of myeloma, progressive CKD who presented with worsening shortness of breath and hypoxia at home. ?CXR was obtained which showed low lung volumes but concern for underlying pulmonary edema. ?Other notable labs included BUN 41, creatinine 4.45, CO2 20. ?WBC 7.1, hemoglobin 8.6 g/dL. ?She was afebrile and initial procalcitonin also negative. ?She was placed on oxygen in the ER due to acute hypoxia. ?Trending of her labs over the past 1 to 2 years showed progressively worsening renal function. ?Nephrology was consulted on admission as well.  Discussion also held bedside with patient and her brother; she was declining going on dialysis if needed and was able to explain consequences of not doing so to include imminent death if renal function continued to decline rapidly.  ? ?Assessment and Plan: ?* Acute respiratory failure with hypoxia ([place]) ?- Not on oxygen at home.  Etiology considered due to underlying pulmonary edema from renal failure.  Other considered differential includes cardiac in origin or contribution vs possible LLL infiltrate but low suspicion for infection at this time (no fever, WBC, negative PCT) therefore hold off on abx at this time ?Plan: ?--transition diuretic to torsemide 20 mg daily today per nephro ?--Continue supplemental O2 to keep sats between 88-92% ? ? ?HTN (hypertension) ?--Home regimen of amlodipine, losartan, metoprolol and torsemide, held on presentation. ?Plan: ?--transition diuretic to torsemide 20 mg daily today per nephro ?--cont home amlodipine and Toprol  ?--hold home losartan for now due to AKI ? ?Acute on chronic diastolic CHF (congestive heart failure) ([place]) ?# mod pulm HTN ?--current Echo showed LVEF 60-65% with grade II DD and mod pulm  HTN ?Plan: ?--transition diuretic to torsemide 20 mg daily today per nephro ? ? ?Anemia of chronic disease ?history of MGUS.  Microcytic  ?--Hgb 8.6 on presentation ?--folate and Vit B12 both low ?--s/p IV iron x1, started on folic acid and Vit V36 supplements ?Plan: ?--cont folic acid and vit P22 supplements ? ?Acute renal failure superimposed on stage 5 chronic kidney disease, not on chronic dialysis [facility]) ?- patient has history of CKD5. Baseline creat ~ 3.4, eGFR 12 ?- patient presents with increase in creat >0.3 mg/dL above baseline, creat increase >1.5x baseline presumed to have occurred within past 7 days PTA ?-Given pattern, patient likely developing progressive renal failure requiring dialysis.  After thorough bedside discussion, patient declining dialysis and understands risks and benefits and able to explain them appropriately ?-Nephrology also following, greatly appreciate assistance ?- Trial of Lasix in efforts to relieve respiratory distress and assess renal response, and pt has actually responded really well to IV lasix with improved Cr ?Plan: ?--transition diuretic to torsemide 20 mg daily today per nephro ? ? ? ? ? ?Subjective:  ?Continue to have good urine output on IV lasix, with Cr improving.  Pt said she was just waiting on her meal. ? ? ?Physical Exam: ?Vitals:  ? 01/21/22 1538 01/21/22 1956 01/22/22 0453 01/22/22 0803  ?BP: (!) 177/81 (!) 159/86 (!) 168/62 (!) 163/57  ?Pulse: 83 73 65 68  ?Resp: _0 ?Temp: 98.2 ?F (36.8 ?C) 97.8 ?F (36.6 ?C) 98 ?F (36.7 ?C) 98.6 ?F (37 ?C)  ?TempSrc: Oral Oral Oral Oral  ?SpO2: 99% 99% 98% 97%  ?Weight:      ?  Height:      ? ? ?Constitutional: NAD, AAOx3 ?HEENT: conjunctivae and lids normal, EOMI ?CV: No cyanosis.   ?RESP: normal respiratory effort, on 1L ?Extremities: No effusions, edema in BLE ?SKIN: warm, dry ?Neuro: II - XII grossly intact.   ?Psych: flat mood and affect.   ? ? ?Data Reviewed: ? ?Family Communication:  ?Disposition: ?Status is:  Inpatient ? ? Planned Discharge Destination: home tomorrow ? ? ? ? ?Time spent: 50 minutes ? ?Author: ?Enzo Bi, MD ?01/22/2022 5:57 PM ? ?For on call review [email or web address].  ? ?

## 2022-01-22 NOTE — Plan of Care (Signed)
  Problem: Health Behavior/Discharge Planning: Goal: Ability to manage health-related needs will improve Outcome: Progressing   Problem: Clinical Measurements: Goal: Ability to maintain clinical measurements within normal limits will improve Outcome: Progressing   

## 2022-01-22 NOTE — TOC Initial Note (Signed)
Transition of Care (TOC) - Initial/Assessment Note  ? ? ?Patient Details  ?Name: Carrie Craig ?MRN: [id number] ?Date of Birth: 12/28/1931 ? ?Transition of Care (TOC) CM/SW Contact:    ?Shamarcus Hoheisel E Karimah Winquist, LCSW ?Phone Number: ?01/22/2022, 9:30 AM ? ?Clinical Narrative:                Confirmed with MD plan for home with home health, patient not wanting hospice care.  ?Spoke to patient who confirms she resides at [facility]. PCP is Dr. Netty Starring. Pharmacy is [facility]. No HH or SNF history. Patient uses a RW. Brother provides transportation.  ?Patient is agreeable to [facility] and no agency preference expressed.  ?Referral made to [facility]. ?Per rounds on 3/4, patient will also likely need home o2. Referral made to [facility] with [facility] to follow on 3/6 on home o2 set up.  ? ? ?Expected Discharge Plan: Group Home ?Barriers to Discharge: Continued Medical Work up ? ? ?Patient Goals and CMS Choice ?Patient states their goals for this hospitalization and ongoing recovery are:: back to [facility] ?CMS Medicare.gov Compare Post Acute Care list provided to:: Patient ?Choice offered to / list presented to : Patient ? ?Expected Discharge Plan and Services ?Expected Discharge Plan: Group Home ?  ?  ?  ?Living arrangements for the past 2 months: Group Home ?                ?DME Arranged: Oxygen ?DME Agency: [facility] ?Date DME Agency Contacted: 01/22/22 ?  ?Representative spoke with at DME Agency: Thedore Mins - sent email for follow up tomorrow ?  ?[place] Agency: [facility] ?Date HH Agency Contacted: 01/22/22 ?  ?Representative spoke with at [place]: Merleen Nicely ? ?Prior Living Arrangements/Services ?Living arrangements for the past 2 months: Group Home ?Lives with:: Facility Resident ?Patient language and need for interpreter reviewed:: Yes ?Do you feel safe going back to the place where you live?: Yes      ?Need for Family Participation in Patient Care: Yes (Comment) ?Care giver support system in place?: Yes (comment) ?Current  home services: DME ?Criminal Activity/Legal Involvement Pertinent to Current Situation/Hospitalization: No - Comment as needed ? ?Activities of Daily Living ?Home Assistive Devices/Equipment: Gilford Rile (specify type) ?ADL Screening (condition at time of admission) ?Patient's cognitive ability adequate to safely complete daily activities?: Yes ?Is the patient deaf or have difficulty hearing?: Yes ?Does the patient have difficulty seeing, even when wearing glasses/contacts?: No ?Does the patient have difficulty concentrating, remembering, or making decisions?: No ?Patient able to express need for assistance with ADLs?: Yes ?Does the patient have difficulty dressing or bathing?: Yes ?Independently performs ADLs?: Yes (appropriate for developmental age) ?Does the patient have difficulty walking or climbing stairs?: No ?Weakness of Legs: None ?Weakness of Arms/Hands: None ? ?Permission Sought/Granted ?Permission sought to share information with : Customer service manager ?Permission granted to share information with : Yes, Verbal Permission Granted ?   ? Permission granted to share info w AGENCY: [place], DME, [place] ?   ?   ? ?Emotional Assessment ?  ?  ?  ?Orientation: : Oriented to Self, Oriented to Place, Oriented to  Time, Oriented to Situation ?Alcohol / Substance Use: Not Applicable ?Psych Involvement: No (comment) ? ?Admission diagnosis:  SOB (shortness of breath) [R06.02] ?Acute on chronic kidney failure ([place]) [N17.9, N18.9] ?Acute respiratory failure with hypoxia ([place]) [J96.01] ?Patient Active Problem List  ? Diagnosis Date Noted  ? Acute on chronic diastolic CHF (congestive heart failure) ([place])  01/19/2022  ? HTN (hypertension) 01/19/2022  ? Acute renal failure superimposed on stage 5 chronic kidney disease, not on chronic dialysis ([place]) 01/18/2022  ? Acute respiratory failure with hypoxia ([place]) 01/18/2022  ? Anemia of chronic disease 01/18/2022  ? Anemia due to stage 4 chronic kidney disease ([place]) 02/18/2020  ?  Anemia due to stage 3 chronic kidney disease ([place]) 05/31/2018  ? UTI (urinary tract infection) 11/25/2015  ? Thoracic compression fracture ([place]) 11/25/2015  ? ?PCP:  Dion Body, MD ?Pharmacy:   ?[facility] [address] ?Phone: [phone] Fax: [phone] ? ?[address] ?[address] ?[place] [address] ?Phone: [phone] Fax: [phone] ? ? ? ? ?Social Determinants of Health (SDOH) Interventions ?  ? ?Readmission Risk Interventions ?Readmission Risk Prevention Plan 01/22/2022  ?Transportation Screening Complete  ?PCP or Specialist Appt within 5-7 Days Complete  ?Home Care Screening Complete  ?Medication Review (RN CM) Complete  ?Some recent data might be hidden  ? ? ? ?

## 2022-01-23 LAB — PROTEIN ELECTRO, RANDOM URINE
Albumin ELP, Urine: 66.7 %
Alpha-1-Globulin, U: 5.7 %
Alpha-2-Globulin, U: 6.6 %
Beta Globulin, U: 8.8 %
Gamma Globulin, U: 12.2 %
Total Protein, Urine: 277.4 mg/dL

## 2022-01-23 LAB — CBC
HCT: 25.8 % — ABNORMAL LOW (ref 36.0–46.0)
Hemoglobin: 8.5 g/dL — ABNORMAL LOW (ref 12.0–15.0)
MCH: 29.5 pg (ref 26.0–34.0)
MCHC: 32.9 g/dL (ref 30.0–36.0)
MCV: 89.6 fL (ref 80.0–100.0)
Platelets: 289 10*3/uL (ref 150–400)
RBC: 2.88 MIL/uL — ABNORMAL LOW (ref 3.87–5.11)
RDW: 13.4 % (ref 11.5–15.5)
WBC: 7.8 10*3/uL (ref 4.0–10.5)
nRBC: 0 % (ref 0.0–0.2)

## 2022-01-23 LAB — BASIC METABOLIC PANEL
Anion gap: 12 (ref 5–15)
BUN: 63 mg/dL — ABNORMAL HIGH (ref 8–23)
CO2: 28 mmol/L (ref 22–32)
Calcium: 9.1 mg/dL (ref 8.9–10.3)
Chloride: 98 mmol/L (ref 98–111)
Creatinine, Ser: 4.11 mg/dL — ABNORMAL HIGH (ref 0.44–1.00)
GFR, Estimated: 10 mL/min — ABNORMAL LOW (ref >60)
Glucose, Bld: 100 mg/dL — ABNORMAL HIGH (ref 70–99)
Potassium: 3.9 mmol/L (ref 3.5–5.1)
Sodium: 138 mmol/L (ref 135–145)

## 2022-01-23 LAB — PROTEIN ELECTROPHORESIS, SERUM
A/G Ratio: 0.7 (ref 0.7–1.7)
Albumin ELP: 3.2 g/dL (ref 2.9–4.4)
Alpha-1-Globulin: 0.4 g/dL (ref 0.0–0.4)
Alpha-2-Globulin: 1.2 g/dL — ABNORMAL HIGH (ref 0.4–1.0)
Beta Globulin: 1 g/dL (ref 0.7–1.3)
Gamma Globulin: 2 g/dL — ABNORMAL HIGH (ref 0.4–1.8)
Globulin, Total: 4.6 g/dL — ABNORMAL HIGH (ref 2.2–3.9)
M-Spike, %: 0.7 g/dL — ABNORMAL HIGH
Total Protein ELP: 7.8 g/dL (ref 6.0–8.5)

## 2022-01-23 LAB — CULTURE, BLOOD (ROUTINE X 2)
Culture: NO GROWTH
Culture: NO GROWTH
Special Requests: ADEQUATE
Special Requests: ADEQUATE

## 2022-01-23 LAB — MAGNESIUM: Magnesium: 1.8 mg/dL (ref 1.7–2.4)

## 2022-01-23 MED ORDER — CYANOCOBALAMIN 1000 MCG PO TABS: 1000.0000 ug | ORAL_TABLET | Freq: Every day | ORAL | Status: AC

## 2022-01-23 MED ORDER — METOPROLOL SUCCINATE ER 25 MG PO TB24: 25.0000 mg | ORAL_TABLET | Freq: Every day | ORAL | 2 refills | Status: AC

## 2022-01-23 MED ORDER — FOLIC ACID 1 MG PO TABS
1.0000 mg | ORAL_TABLET | Freq: Every day | ORAL | Status: AC
Start: 2022-01-23 — End: ?

## 2022-01-23 MED ORDER — AMLODIPINE BESYLATE 10 MG PO TABS
10.0000 mg | ORAL_TABLET | Freq: Every day | ORAL | 2 refills | Status: AC
Start: 2022-01-23 — End: 2022-04-23

## 2022-01-23 MED ORDER — TORSEMIDE 20 MG PO TABS: 20.0000 mg | ORAL_TABLET | Freq: Every day | ORAL | 2 refills | Status: AC

## 2022-01-23 NOTE — NC FL2 (Signed)
?[place] MEDICAID FL2 LEVEL OF CARE SCREENING TOOL  ?  ? ?IDENTIFICATION  ?Patient Name: ?Carrie Craig Birthdate: [birth year] Sex: female Admission Date (Current Location): ?01/18/2022  ?[place] and [place] Number: ? [place] ?  Facility and Address:  ?  ?     Provider Number: ?[id number]  ?Attending Physician Name and Address:  ?Enzo Bi, MD ? Relative Name and Phone Number:  ?  ?   ?Current Level of Care: ?Hospital Recommended Level of Care: ?Family Care Home Prior Approval Number: ?  ? ?Date Approved/Denied: ?  PASRR Number: ?  ? ?Discharge Plan: ?Other (Comment) (Family care home) ?  ? ?Current Diagnoses: ?Patient Active Problem List  ? Diagnosis Date Noted  ? Acute on chronic diastolic CHF (congestive heart failure) ([place]) 01/19/2022  ? HTN (hypertension) 01/19/2022  ? Acute renal failure superimposed on stage 5 chronic kidney disease, not on chronic dialysis ([place]) 01/18/2022  ? Acute respiratory failure with hypoxia ([place]) 01/18/2022  ? Anemia of chronic disease 01/18/2022  ? Anemia due to stage 4 chronic kidney disease ([place]) 02/18/2020  ? Anemia due to stage 3 chronic kidney disease ([place]) 05/31/2018  ? UTI (urinary tract infection) 11/25/2015  ? Thoracic compression fracture ([place]) 11/25/2015  ? ? ?Orientation RESPIRATION BLADDER Height & Weight   ?  ?Self, Time, Situation, Place ? O2 (1L [place]) Incontinent Weight: 53.5 kg ?Height:  '4\' 9"'$  (144.8 cm)  ?BEHAVIORAL SYMPTOMS/MOOD NEUROLOGICAL BOWEL NUTRITION STATUS  ?    Incontinent Diet (2 g sodium)  ?AMBULATORY STATUS COMMUNICATION OF NEEDS Skin   ?Supervision Verbally Normal ?  ?  ?  ?    ?     ?     ? ? ?Personal Care Assistance Level of Assistance  ?    ?  ?  ?   ? ?Functional Limitations Info  ?    ?  ?   ? ? ?SPECIAL CARE FACTORS FREQUENCY  ?PT (By licensed PT)   ?  ?PT Frequency: Home health through [facility] ?  ?  ?  ?  ?   ? ? ?Contractures Contractures Info: Not present  ? ? ?Additional Factors Info  ?Code Status, Allergies Code Status Info: DNR ?Allergies  Info: NKDA ?  ?  ?  ?   ? ?TAKE these medications   ?  ?albuterol 108 (90 Base) MCG/ACT inhaler ?Commonly known as: VENTOLIN HFA ?Inhale 2 puffs into the lungs every 4 (four) hours as needed for wheezing or shortness of breath. ?   ?amLODipine 10 MG tablet ?Commonly known as: NORVASC ?Take 1 tablet (10 mg total) by mouth daily. ?   ?aspirin EC 81 MG tablet ?Take 81 mg by mouth daily. ?   ?atorvastatin 40 MG tablet ?Commonly known as: LIPITOR ?Take 40 mg by mouth at bedtime. ?   ?benzonatate 200 MG capsule ?Commonly known as: TESSALON ?Take 200 mg by mouth 3 (three) times daily as needed for cough. ?   ?Calcium Carbonate-Vitamin D3 600-400 MG-UNIT Tabs ?Take 1 tablet by mouth 2 (two) times daily with a meal. ?   ?clopidogrel 75 MG tablet ?Commonly known as: PLAVIX ?Take 75 mg by mouth daily. ?   ?cyanocobalamin 1000 MCG tablet ?Take 1 tablet (1,000 mcg total) by mouth daily. Can take any over-the-counter form. ?   ?dextromethorphan-guaiFENesin 30-600 MG 12hr tablet ?Commonly known as: [place] DM ?Take 1 tablet by mouth 2 (two) times daily as needed for cough. ?   ?ferrous sulfate 325 (65 FE) MG tablet ?Take  325 mg by mouth every other day. ?   ?folic acid 1 MG tablet ?Commonly known as: FOLVITE ?Take 1 tablet (1 mg total) by mouth daily. Can take any over-the-counter form. ?   ?loratadine 10 MG tablet ?Commonly known as: CLARITIN ?Take 10 mg by mouth daily. ?   ?metoprolol succinate 25 MG 24 hr tablet ?Commonly known as: TOPROL-XL ?Take 1 tablet (25 mg total) by mouth daily. ?   ?pantoprazole 40 MG tablet ?Commonly known as: PROTONIX ?Take 40 mg by mouth daily. ?   ?torsemide 20 MG tablet ?Commonly known as: DEMADEX ?Take 1 tablet (20 mg total) by mouth daily. ?What changed:  ?when to take this ?reasons to take this ?   ?traZODone 50 MG tablet ?Commonly known as: DESYREL ?Take 50 mg by mouth at bedtime.  ? ?Relevant Imaging Results: ? ?Relevant Lab Results: ? ? ?Additional Information ?  ? ?Beverly Sessions,  RN ? ? ? ? ?

## 2022-01-23 NOTE — Care Management Important Message (Signed)
Important Message ? ?Patient Details  ?Name: Carrie Craig ?MRN: [id number] ?Date of Birth: 08/31/1932 ? ? ?Medicare Important Message Given:  Yes ? ? ? ? ?[name] ?01/23/2022, 11:05 AM ?

## 2022-01-23 NOTE — Progress Notes (Signed)
Patient 94% oxygenation  room air at rest.  Patient 87% oxygenation with ambulation on room air.  Patient quickly returned to 95%on 1L oxygen at rest.  Assisted patient to bed and continued 1 L oxygen. Call bell within reach ? ?

## 2022-01-23 NOTE — Plan of Care (Signed)
Patient to be discharged home with brother transporting.  Patient verbalized understanding.  All belongings at bedside.  Discharge packet reviewed with patient and packet given. PIV removed prior to discharge. ?

## 2022-01-23 NOTE — Discharge Summary (Signed)
Physician Discharge Summary   Carrie Craig  female DOB: 07/22/1932  JIR:[id number]  PCP: Dion Body, MD  Admit date: 01/18/2022 Discharge date: 01/23/2022  Admitted From: family home Disposition:  family home Home Health: Yes CODE STATUS: DNR  Pt wants all medical treatment except for intubation and CPR.     Hospital Course:  For full details, please see H&P, progress notes, consult notes and ancillary notes.  Briefly,  [name] is an 88 year old female with PMH DM II, CVA, history of myeloma, progressive CKD who presented with worsening shortness of breath and hypoxia at home.  CXR was obtained which showed low lung volumes but concern for underlying pulmonary edema.  She was afebrile and initial procalcitonin also negative. She was placed on oxygen in the ER due to acute hypoxia.  Nephrology was consulted on admission as well.  Discussion also held bedside with patient and her brother; she was declining going on dialysis if needed and was able to explain consequences of not doing so to include imminent death if renal function continued to decline rapidly.    * Acute respiratory failure with hypoxia (HCC) 2/2 Pulm edema - Not on oxygen at home.  Etiology considered due to underlying pulmonary edema from renal failure.  Low suspicion for infection at this time (no fever, WBC, negative PCT) therefore abx not given. --Pt was diuresed with IV lasix 60 mg q12h during hospitalization with good urine output, improvement in her respiratory symptoms, and actually improved Cr.   --Pt was transitioned to torsemide 20 mg daily per nephro the day before discharge. --Prior to discharge, pt still needs 1L supplemental oxygen to maintain O2 sat >88% with walking.  Pt is discharged on 1L home O2.  Acute on chronic diastolic CHF (congestive heart failure) ([place]) # mod pulm HTN --current Echo showed LVEF 60-65% with grade II DD and mod pulm HTN.   --pt was diuresed and discharged with 1L  home O2 (see above).   Progression of chronic kidney disease stage V to end stage renal disease AKI ruled out --per nephrology, baseline creatinine of 4.27, GFR of 10 on 12/05/2021.  --Cr 4.45 on presentation, did not meet criteria for AKI. --multiple discussions between providers, pt and brother, all confirmed no dialysis. --follow up with outpatient nephro closely after discharge.  HTN (hypertension) --cont home amlodipine, Toprol, torsemide as 20 mg daily.   --home losartan d/c'ed.  Anemia of chronic disease history of MGUS.  Microcytic  Iron, vit B12 and folate def --Hgb 8.6 on presentation --s/p IV iron x1, started on folic acid and Vit B51 supplements. --cont iron, folic acid and vit W25 supplements   Discharge Diagnoses:  Principal Problem:   Acute respiratory failure with hypoxia (HCC) Active Problems:   Acute renal failure superimposed on stage 5 chronic kidney disease, not on chronic dialysis (HCC)   Anemia of chronic disease   Acute on chronic diastolic CHF (congestive heart failure) (HCC)   HTN (hypertension)   30 Day Unplanned Readmission Risk Score    Flowsheet Row ED to Hosp-Admission (Current) from 01/18/2022 in [facility]  30 Day Unplanned Readmission Risk Score (%) 19.83 Filed at 01/23/2022 0801       This score is the patient's risk of an unplanned readmission within 30 days of being discharged (0 -100%). The score is based on dignosis, age, lab data, medications, orders, and past utilization.   Low:  0-14.9   Medium: 15-21.9   High: 22-29.9  Extreme: 30 and above         Discharge Instructions:  Allergies as of 01/23/2022   No Known Allergies      Medication List     STOP taking these medications    losartan 25 MG tablet Commonly known as: COZAAR       TAKE these medications    albuterol 108 (90 Base) MCG/ACT inhaler Commonly known as: VENTOLIN HFA Inhale 2 puffs into the lungs every 4 (four)  hours as needed for wheezing or shortness of breath.   amLODipine 10 MG tablet Commonly known as: NORVASC Take 1 tablet (10 mg total) by mouth daily.   aspirin EC 81 MG tablet Take 81 mg by mouth daily.   atorvastatin 40 MG tablet Commonly known as: LIPITOR Take 40 mg by mouth at bedtime.   benzonatate 200 MG capsule Commonly known as: TESSALON Take 200 mg by mouth 3 (three) times daily as needed for cough.   Calcium Carbonate-Vitamin D3 600-400 MG-UNIT Tabs Take 1 tablet by mouth 2 (two) times daily with a meal.   clopidogrel 75 MG tablet Commonly known as: PLAVIX Take 75 mg by mouth daily.   cyanocobalamin 1000 MCG tablet Take 1 tablet (1,000 mcg total) by mouth daily. Can take any over-the-counter form.   dextromethorphan-guaiFENesin 30-600 MG 12hr tablet Commonly known as: MUCINEX DM Take 1 tablet by mouth 2 (two) times daily as needed for cough.   ferrous sulfate 325 (65 FE) MG tablet Take 325 mg by mouth every other day.   folic acid 1 MG tablet Commonly known as: FOLVITE Take 1 tablet (1 mg total) by mouth daily. Can take any over-the-counter form.   loratadine 10 MG tablet Commonly known as: CLARITIN Take 10 mg by mouth daily.   metoprolol succinate 25 MG 24 hr tablet Commonly known as: TOPROL-XL Take 1 tablet (25 mg total) by mouth daily.   pantoprazole 40 MG tablet Commonly known as: PROTONIX Take 40 mg by mouth daily.   torsemide 20 MG tablet Commonly known as: DEMADEX Take 1 tablet (20 mg total) by mouth daily. What changed:  when to take this reasons to take this   traZODone 50 MG tablet Commonly known as: DESYREL Take 50 mg by mouth at bedtime.               Durable Medical Equipment  (From admission, onward)           Start     Ordered   01/22/22 0944  For home use only DME oxygen  Once       Question Answer Comment  Length of Need 6 Months   Mode or (Route) Nasal cannula   Liters per Minute 1   Frequency Continuous  (stationary and portable oxygen unit needed)   Oxygen delivery system Gas      01/22/22 0944             Follow-up Information     Dion Body, MD Follow up in 1 week(s).   Specialty: Family Medicine Contact information: [place] [address] [phone]         Lavonia Dana, MD Follow up in 1 week(s).   Specialty: Nephrology Contact information: [address] [place] [address] [phone]                 No Known Allergies   The results of significant diagnostics from this hospitalization (including imaging, microbiology, ancillary and  laboratory) are listed below for reference.   Consultations:   Procedures/Studies: DG Chest Port 1 View  Result Date: 01/18/2022 CLINICAL DATA:  Worsening shortness of breath. EXAM: PORTABLE CHEST 1 VIEW COMPARISON:  September 14, 2021 FINDINGS: Enlarged cardiac silhouette. Calcific atherosclerotic disease and tortuosity of the aorta. Low lung volumes. Diffuse bilateral linear and ground-glass opacities throughout both lungs. Possible left pleural effusion and/or more confluent consolidation in the left lung base. IMPRESSION: 1. Diffuse bilateral linear and ground-glass opacities throughout both lungs may represent mixed pattern pulmonary edema or multifocal, possibly atypical pneumonia with area of confluent consolidation in the left lung base. Electronically Signed   By: Fidela Salisbury M.D.   On: 01/18/2022 08:48   ECHOCARDIOGRAM COMPLETE  Result Date: 01/19/2022    ECHOCARDIOGRAM REPORT   Patient Name:   CURRY DULSKI Date of Exam: 01/19/2022 Medical Rec #:  [id number]     Height:       57.0 in Accession #:    [id number]    Weight:       118.0 lb Date of Birth:  1932/03/30      BSA:          1.437 m Patient Age:    63 years      BP:           138/59 mmHg Patient Gender: F             HR:           69 bpm. Exam Location:  [facility] Procedure: 2D Echo,  Cardiac Doppler and Color Doppler Indications:     Pulmonary Embolus  History:         Patient has no prior history of Echocardiogram examinations.                  Stroke; Risk Factors:Diabetes.  Sonographer:     Sherrie Sport Referring Phys:  [place] Diagnosing Phys: Ida Rogue MD  Sonographer Comments: Suboptimal apical window. IMPRESSIONS  1. Left ventricular ejection fraction, by estimation, is 60 to 65%. The left ventricle has normal function. The left ventricle has no regional wall motion abnormalities. Left ventricular diastolic parameters are consistent with Grade II diastolic dysfunction (pseudonormalization).  2. Right ventricular systolic function is normal. The right ventricular size is normal. There is moderately elevated pulmonary artery systolic pressure. The estimated right ventricular systolic pressure is 13.2 mmHg.  3. Left atrial size was moderately dilated.  4. The mitral valve is normal in structure. Moderate mitral valve regurgitation. No evidence of mitral stenosis.  5. Tricuspid valve regurgitation is moderate.  6. The aortic valve is tricuspid. Aortic valve regurgitation is mild. No aortic stenosis is present. FINDINGS  Left Ventricle: Left ventricular ejection fraction, by estimation, is 60 to 65%. The left ventricle has normal function. The left ventricle has no regional wall motion abnormalities. The left ventricular internal cavity size was normal in size. There is  no left ventricular hypertrophy. Left ventricular diastolic parameters are consistent with Grade II diastolic dysfunction (pseudonormalization). Right Ventricle: The right ventricular size is normal. No increase in right ventricular wall thickness. Right ventricular systolic function is normal. There is moderately elevated pulmonary artery systolic pressure. The tricuspid regurgitant velocity is 3.19 m/s, and with an assumed right atrial pressure of 5 mmHg, the estimated right ventricular systolic pressure is  44.0 mmHg. Left Atrium: Left atrial size was moderately dilated. Right Atrium: Right atrial size was normal in size. Pericardium: There is no evidence of pericardial  effusion. Mitral Valve: The mitral valve is normal in structure. Moderate mitral valve regurgitation. No evidence of mitral valve stenosis. MV peak gradient, 5.7 mmHg. The mean mitral valve gradient is 2.0 mmHg. Tricuspid Valve: The tricuspid valve is normal in structure. Tricuspid valve regurgitation is moderate . No evidence of tricuspid stenosis. Aortic Valve: The aortic valve is tricuspid. Aortic valve regurgitation is mild. No aortic stenosis is present. Aortic valve mean gradient measures 3.0 mmHg. Aortic valve peak gradient measures 5.9 mmHg. Aortic valve area, by VTI measures 2.58 cm. Pulmonic Valve: The pulmonic valve was normal in structure. Pulmonic valve regurgitation is mild. No evidence of pulmonic stenosis. Aorta: The aortic root is normal in size and structure. Venous: The inferior vena cava was not well visualized. IAS/Shunts: No atrial level shunt detected by color flow Doppler.  LEFT VENTRICLE PLAX 2D LVIDd:         4.40 cm   Diastology LVIDs:         2.80 cm   LV e' medial:    3.59 cm/s LV PW:         0.70 cm   LV E/e' medial:  30.1 LV IVS:        1.00 cm   LV e' lateral:   7.29 cm/s LVOT diam:     2.00 cm   LV E/e' lateral: 14.8 LV SV:         69 LV SV Index:   48 LVOT Area:     3.14 cm  RIGHT VENTRICLE RV Basal diam:  3.00 cm RV S prime:     11.10 cm/s TAPSE (M-mode): 1.6 cm LEFT ATRIUM             Index        RIGHT ATRIUM           Index LA diam:        4.60 cm 3.20 cm/m   RA Area:     12.50 cm LA Vol (A2C):   51.3 ml 35.69 ml/m  RA Volume:   26.90 ml  18.72 ml/m LA Vol (A4C):   51.6 ml 35.90 ml/m LA Biplane Vol: 55.0 ml 38.27 ml/m  AORTIC VALVE                    PULMONIC VALVE AV Area (Vmax):    2.73 cm     PV Vmax:        1.06 m/s AV Area (Vmean):   2.79 cm     PV Vmean:       74.300 cm/s AV Area (VTI):     2.58 cm      PV VTI:         0.218 m AV Vmax:           121.00 cm/s  PV Peak grad:   4.5 mmHg AV Vmean:          80.800 cm/s  PV Mean grad:   3.0 mmHg AV VTI:            0.267 m      RVOT Peak grad: 2 mmHg AV Peak Grad:      5.9 mmHg AV Mean Grad:      3.0 mmHg LVOT Vmax:         105.00 cm/s LVOT Vmean:        71.700 cm/s LVOT VTI:          0.219 m LVOT/AV VTI ratio: 0.82  AORTA Ao Root diam:  3.10 cm MITRAL VALVE                TRICUSPID VALVE MV Area (PHT): 3.79 cm     TR Peak grad:   40.7 mmHg MV Area VTI:   2.50 cm     TR Vmax:        319.00 cm/s MV Peak grad:  5.7 mmHg MV Mean grad:  2.0 mmHg     SHUNTS MV Vmax:       1.19 m/s     Systemic VTI:  0.22 m MV Vmean:      58.4 cm/s    Systemic Diam: 2.00 cm MV Decel Time: 200 msec     Pulmonic VTI:  0.152 m MV E velocity: 108.00 cm/s MV A velocity: 83.60 cm/s MV E/A ratio:  1.29 Ida Rogue MD Electronically signed by Ida Rogue MD Signature Date/Time: 01/19/2022/12:56:11 PM    Final       Labs: BNP (last 3 results) Recent Labs    09/14/21 0904 01/18/22 0908  BNP 374.8* 469.6*   Basic Metabolic Panel: Recent Labs  Lab 01/18/22 0830 01/18/22 1350 01/21/22 0358 01/22/22 0506 01/23/22 0504  NA 138  --  140 139 138  K 3.9  --  4.2 4.1 3.9  CL 106  --  104 100 98  CO2 20*  --  '27 28 28  '$ GLUCOSE 100*  --  86 98 100*  BUN 41*  --  48* 58* 63*  CREATININE 4.45*  --  4.18* 3.89* 4.11*  CALCIUM 9.0  --  8.6* 9.5 9.1  MG  --   --  1.9 1.9 1.8  PHOS  --  3.7  --   --   --    Liver Function Tests: Recent Labs  Lab 01/18/22 1350  ALBUMIN 3.1*   No results for input(s): LIPASE, AMYLASE in the last 168 hours. No results for input(s): AMMONIA in the last 168 hours. CBC: Recent Labs  Lab 01/18/22 0830 01/21/22 0358 01/22/22 0506 01/23/22 0504  WBC 7.1 6.4 6.8 7.8  HGB 8.6* 7.7* 9.1* 8.5*  HCT 26.8* 24.3* 27.9* 25.8*  MCV 92.1 92.4 90.0 89.6  PLT 269 285 300 289   Cardiac Enzymes: No results for input(s): CKTOTAL, CKMB, CKMBINDEX,  TROPONINI in the last 168 hours. BNP: Invalid input(s): POCBNP CBG: No results for input(s): GLUCAP in the last 168 hours. D-Dimer No results for input(s): DDIMER in the last 72 hours. Hgb A1c No results for input(s): HGBA1C in the last 72 hours. Lipid Profile No results for input(s): CHOL, HDL, LDLCALC, TRIG, CHOLHDL, LDLDIRECT in the last 72 hours. Thyroid function studies No results for input(s): TSH, T4TOTAL, T3FREE, THYROIDAB in the last 72 hours.  Invalid input(s): FREET3 Anemia work up Recent Labs    01/20/22 1631  VITAMINB12 127*  FOLATE 4.2*   Urinalysis    Component Value Date/Time   COLORURINE YELLOW (A) 11/24/2015 0600   APPEARANCEUR CLOUDY (A) 11/24/2015 0600   APPEARANCEUR Clear 08/29/2014 1847   LABSPEC 1.020 11/24/2015 0600   LABSPEC 1.012 08/29/2014 1847   PHURINE 5.0 11/24/2015 0600   GLUCOSEU NEGATIVE 11/24/2015 0600   GLUCOSEU >=500 08/29/2014 1847   HGBUR NEGATIVE 11/24/2015 0600   BILIRUBINUR NEGATIVE 11/24/2015 0600   BILIRUBINUR Negative 08/29/2014 1847   KETONESUR 1+ (A) 11/24/2015 0600   PROTEINUR >500 (A) 11/24/2015 0600   NITRITE NEGATIVE 11/24/2015 0600   LEUKOCYTESUR NEGATIVE 11/24/2015 0600   LEUKOCYTESUR Negative 08/29/2014 1847   Sepsis Labs  Invalid input(s): PROCALCITONIN,  WBC,  LACTICIDVEN Microbiology Recent Results (from the past 240 hour(s))  Culture, blood (Routine X 2) w Reflex to ID Panel     Status: None   Collection Time: 01/18/22  9:17 AM   Specimen: BLOOD  Result Value Ref Range Status   Specimen Description BLOOD RIGHT FA  Final   Special Requests   Final    BOTTLES DRAWN AEROBIC AND ANAEROBIC Blood Culture adequate volume   Culture   Final    NO GROWTH 5 DAYS Performed at [facility], [address] [place], [place] [address]    Report Status 01/23/2022 FINAL  Final  Culture, blood (Routine X 2) w Reflex to ID Panel     Status: None   Collection Time: 01/18/22  9:17 AM   Specimen: BLOOD  Result  Value Ref Range Status   Specimen Description BLOOD LEFTH FA  Final   Special Requests   Final    BOTTLES DRAWN AEROBIC AND ANAEROBIC Blood Culture adequate volume   Culture   Final    NO GROWTH 5 DAYS Performed at [facility], [address], [place], [place] [address]    Report Status 01/23/2022 FINAL  Final     Total time spend on discharging this patient, including the last patient exam, discussing the hospital stay, instructions for ongoing care as it relates to all pertinent caregivers, as well as preparing the medical discharge records, prescriptions, and/or referrals as applicable, is 40 minutes.    Enzo Bi, MD  [facility] 01/23/2022, 8:36 AM

## 2022-01-23 NOTE — Progress Notes (Signed)
?[facility]  ?ROUNDING NOTE  ? ?Subjective:  ? ?[name] was admitted to [facility] on 01/18/2022 for SOB (shortness of breath) [R06.02] ?Acute on chronic kidney failure ([place]) [N17.9, N18.9] ?Acute respiratory failure with hypoxia ([place]) [J96.01] ? ?Patient was last seen by my partner, Dr. Murlean Iba, on 07/06/21 for chronic kidney disease. Patient at that time decided that she does not want dialysis.  ? ?Patient seen resting quietly, alert and oriented ?States she is tolerating small meals ?Denies shortness of breath, states respiratory status has improved ? ?Creatinine 4.1 ?Urine output 1.6 L in 24 hours ? ? ?Objective:  ?Vital signs in last 24 hours:  ?Temp:  [98.2 ?F (36.8 ?C)-99.3 ?F (37.4 ?C)] 98.2 ?F (36.8 ?C) (03/06 0756) ?Pulse Rate:  [62-73] 67 (03/06 0756) ?Resp:  [17-18] 18 (03/06 0756) ?BP: (146-169)/(43-59) 153/43 (03/06 0756) ?SpO2:  [94 %-99 %] 94 % (03/06 1000) ? ?Weight change:  ?Filed Weights  ? 01/18/22 1836  ?Weight: 53.5 kg  ? ? ?Intake/Output: ?I/O last 3 completed shifts: ?In: -  ?Out: 2750 [Urine:2750] ?  ?Intake/Output this shift: ? Total I/O ?In: 480 [P.O.:480] ?Out: -  ? ?Physical Exam: ?General: NAD  ?Head: Normocephalic, atraumatic. Moist oral mucosal membranes  ?Eyes: Anicteric  ?Lungs:  Diminished in bases, [place] O2  ?Heart: Regular rate and rhythm  ?Abdomen:  Soft, nontender  ?Extremities:  no peripheral edema.  ?Neurologic: Nonfocal, moving all four extremities  ?Skin: No lesions  ?Access: none  ? ? ?Basic Metabolic Panel: ?Recent Labs  ?Lab 01/18/22 ?0830 01/18/22 ?1350 01/21/22 ?0358 01/22/22 ?4315 01/23/22 ?4008  ?NA 138  --  140 139 138  ?K 3.9  --  4.2 4.1 3.9  ?CL 106  --  104 100 98  ?CO2 20*  --  '27 28 28  '$ ?GLUCOSE 100*  --  86 98 100*  ?BUN 41*  --  48* 58* 63*  ?CREATININE 4.45*  --  4.18* 3.89* 4.11*  ?CALCIUM 9.0  --  8.6* 9.5 9.1  ?MG  --   --  1.9 1.9 1.8  ?PHOS  --  3.7  --   --   --   ? ? ? ?Liver Function Tests: ?Recent Labs  ?Lab 01/18/22 ?1350   ?ALBUMIN 3.1*  ? ? ?No results for input(s): LIPASE, AMYLASE in the last 168 hours. ?No results for input(s): AMMONIA in the last 168 hours. ? ?CBC: ?Recent Labs  ?Lab 01/18/22 ?0830 01/21/22 ?0358 01/22/22 ?6761 01/23/22 ?9509  ?WBC 7.1 6.4 6.8 7.8  ?HGB 8.6* 7.7* 9.1* 8.5*  ?HCT 26.8* 24.3* 27.9* 25.8*  ?MCV 92.1 92.4 90.0 89.6  ?PLT 269 285 300 289  ? ? ? ?Cardiac Enzymes: ?No results for input(s): CKTOTAL, CKMB, CKMBINDEX, TROPONINI in the last 168 hours. ? ?BNP: ?Invalid input(s): POCBNP ? ?CBG: ?No results for input(s): GLUCAP in the last 168 hours. ? ?Microbiology: ?Results for orders placed or performed during the hospital encounter of 01/18/22  ?Culture, blood (Routine X 2) w Reflex to ID Panel     Status: None  ? Collection Time: 01/18/22  9:17 AM  ? Specimen: BLOOD  ?Result Value Ref Range Status  ? Specimen Description BLOOD RIGHT FA  Final  ? Special Requests   Final  ?  BOTTLES DRAWN AEROBIC AND ANAEROBIC Blood Culture adequate volume  ? Culture   Final  ?  NO GROWTH 5 DAYS ?Performed at [facility], [address], [place], [place] [address] ?  ? Report  Status 01/23/2022 FINAL  Final  ?Culture, blood (Routine X 2) w Reflex to ID Panel     Status: None  ? Collection Time: 01/18/22  9:17 AM  ? Specimen: BLOOD  ?Result Value Ref Range Status  ? Specimen Description BLOOD LEFTH FA  Final  ? Special Requests   Final  ?  BOTTLES DRAWN AEROBIC AND ANAEROBIC Blood Culture adequate volume  ? Culture   Final  ?  NO GROWTH 5 DAYS ?Performed at [facility], [address], [place], [place] [address] ?  ? Report Status 01/23/2022 FINAL  Final  ? ? ?Coagulation Studies: ?No results for input(s): LABPROT, INR in the last 72 hours. ? ?Urinalysis: ?No results for input(s): COLORURINE, LABSPEC, [place], GLUCOSEU, HGBUR, BILIRUBINUR, KETONESUR, PROTEINUR, UROBILINOGEN, NITRITE, LEUKOCYTESUR in the last 72 hours. ? ?Invalid input(s): APPERANCEUR  ? ? ?Imaging: ?No results found. ? ? ?Medications:   ? ? ? amLODipine  10 mg Oral Daily  ? folic acid  1 mg Oral Daily  ? metoprolol succinate  25 mg Oral Daily  ? sodium chloride flush  3 mL Intravenous Q12H  ? torsemide  20 mg Oral Daily  ? traZODone  50 mg Oral QHS  ? vitamin B-12  1,000 mcg Oral Daily  ? ? ? ?Assessment/ Plan:  ?[name] is a 88 y.o. white female with MGUS, diabetes mellitus type II diet controlled, GERD, CVA, hypertension, insomnia who is admitted to [facility] on 01/18/2022 ?for SOB (shortness of breath) [R06.02] ?Acute on chronic kidney failure ([place]) [N17.9, N18.9] ?Acute respiratory failure with hypoxia ([place]) [J96.01]  ? ?Acute kidney injury on chronic kidney disease stage V verses progression of chronic kidney disease to end stage renal disease: baseline creatinine of 4.27, GFR of 10 on 12/05/2021. Patient does not want dialysis.  ?-Initially received IV furosemide on admission.  Transitioned to oral torsemide 20 mg yesterday.  Recommend discharging on torsemide 20 mg 3 times weekly. ?- Will require close office follow-up at discharge ? ?Anemia with chronic kidney disease: with history of MGUS. History of requiring IV iron. Microcytic ?-Iron studies  indicate Iron deficiency anemia ? ? ? Latest Reference Range & Units 01/18/22 13:50 01/20/22 16:31  ?Iron 28 - 170 ug/dL 15 (L)   ?UIBC ug/dL 212   ?TIBC 250 - 450 ug/dL 227 (L)   ?Saturation Ratios 10.4 - 31.8 % 7 (L)   ?Ferritin 11 - 307 ng/mL 143   ?Folate >5.9 ng/mL  4.2 (L)  ?(L): Data is abnormally low ? ?Folate deficiency as well  ?-Hemoglobin 8.5 today ? ?Hypertension with chronic kidney disease: with echocardiogram from 12/17/2019 with preserved ejection fraction. Elevated 166/98 when examined. Volume driven. Home regimen of amlodipine, losartan, metoprolol and torsemide.  ?-Amlodipine, metoprolol, and torsemide currently prescribed. ? ? ? ? ? LOS: 5 ?[place] ?3/6/20232:51 PM ?  ?

## 2022-01-23 NOTE — TOC Transition Note (Signed)
Transition of Care (TOC) - CM/SW Discharge Note ? ? ?Patient Details  ?Name: Carrie Craig ?MRN: [id number] ?Date of Birth: 1932/02/05 ? ?Transition of Care (TOC) CM/SW Contact:  ?Beverly Sessions, RN ?Phone Number: ?01/23/2022, 11:49 AM ? ? ?Clinical Narrative:    ? ?Patient to discharge today.  ?Brother to arrive around 1 pm for transport ? ?Clarified patient is from [facility].  Spoke with Junious Dresser.  She is aware that patient will have o2 at discharge ? ?[employer] has delivered O2 to room ?Merleen Nicely with [facility] notified of discharge ? ?Fl2 sent for signature ? ?Will place dc summary and signed fl2 in discharge packet ? ? ?  ?Barriers to Discharge: Continued Medical Work up ? ? ?Patient Goals and CMS Choice ?Patient states their goals for this hospitalization and ongoing recovery are:: back to [facility] ?CMS Medicare.gov Compare Post Acute Care list provided to:: Patient ?Choice offered to / list presented to : Patient ? ?Discharge Placement ?  ?           ?  ?  ?  ?  ? ?Discharge Plan and Services ?  ?  ?           ?DME Arranged: Oxygen ?DME Agency: [employer] ?Date DME Agency Contacted: 01/22/22 ?  ?Representative spoke with at DME Agency: Thedore Mins - sent email for follow up tomorrow ?  ?[place] Agency: [facility] ?Date HH Agency Contacted: 01/22/22 ?  ?Representative spoke with at [place]: Merleen Nicely ? ?Social Determinants of Health (SDOH) Interventions ?  ? ? ?Readmission Risk Interventions ?Readmission Risk Prevention Plan 01/22/2022  ?Transportation Screening Complete  ?PCP or Specialist Appt within 5-7 Days Complete  ?Home Care Screening Complete  ?Medication Review (RN CM) Complete  ?Some recent data might be hidden  ? ? ? ? ? ?

## 2022-01-31 DIAGNOSIS — J9601 Acute respiratory failure with hypoxia: Secondary | ICD-10-CM | POA: Diagnosis not present

## 2022-01-31 DIAGNOSIS — I272 Pulmonary hypertension, unspecified: Secondary | ICD-10-CM | POA: Diagnosis not present

## 2022-01-31 DIAGNOSIS — D631 Anemia in chronic kidney disease: Secondary | ICD-10-CM | POA: Diagnosis not present

## 2022-01-31 DIAGNOSIS — E1122 Type 2 diabetes mellitus with diabetic chronic kidney disease: Secondary | ICD-10-CM | POA: Diagnosis not present

## 2022-01-31 DIAGNOSIS — I132 Hypertensive heart and chronic kidney disease with heart failure and with stage 5 chronic kidney disease, or end stage renal disease: Secondary | ICD-10-CM | POA: Diagnosis not present

## 2022-01-31 DIAGNOSIS — I5032 Chronic diastolic (congestive) heart failure: Secondary | ICD-10-CM | POA: Diagnosis not present

## 2022-01-31 DIAGNOSIS — N185 Chronic kidney disease, stage 5: Secondary | ICD-10-CM | POA: Diagnosis not present

## 2022-02-14 ENCOUNTER — Telehealth: Payer: Self-pay | Admitting: Student

## 2022-02-14 NOTE — Telephone Encounter (Signed)
Attempted to contact patient's brother Polly Cobia [facility]) on his cell # to let him know about the Palliative referral, no answer - left VM requesting a return call. ? ?I then contacted [facility] to let them know about the referral and that I needed to schedule a visit and lady that answered the phone said to call back in about 45 minutes that her boss, Mr. Hinda Kehr would be back at that time ?

## 2022-02-14 NOTE — Telephone Encounter (Signed)
Rec'd return call from patient's brother Polly Cobia and we discussed the Palliative referral/services and he was in agreement with beginning services with [place].  I have scheduled a Palliative Consult on 02/17/22 @ 9:30 AM at [facility]. ?

## 2022-02-15 DIAGNOSIS — J9601 Acute respiratory failure with hypoxia: Secondary | ICD-10-CM | POA: Diagnosis not present

## 2022-02-15 DIAGNOSIS — I132 Hypertensive heart and chronic kidney disease with heart failure and with stage 5 chronic kidney disease, or end stage renal disease: Secondary | ICD-10-CM | POA: Diagnosis not present

## 2022-02-15 DIAGNOSIS — I272 Pulmonary hypertension, unspecified: Secondary | ICD-10-CM | POA: Diagnosis not present

## 2022-02-15 DIAGNOSIS — D631 Anemia in chronic kidney disease: Secondary | ICD-10-CM | POA: Diagnosis not present

## 2022-02-15 DIAGNOSIS — N185 Chronic kidney disease, stage 5: Secondary | ICD-10-CM | POA: Diagnosis not present

## 2022-02-15 DIAGNOSIS — E1122 Type 2 diabetes mellitus with diabetic chronic kidney disease: Secondary | ICD-10-CM | POA: Diagnosis not present

## 2022-02-15 DIAGNOSIS — I5032 Chronic diastolic (congestive) heart failure: Secondary | ICD-10-CM | POA: Diagnosis not present

## 2022-02-17 ENCOUNTER — Other Ambulatory Visit: Payer: Self-pay | Admitting: Student

## 2022-02-17 DIAGNOSIS — R531 Weakness: Secondary | ICD-10-CM | POA: Diagnosis not present

## 2022-02-17 DIAGNOSIS — I5033 Acute on chronic diastolic (congestive) heart failure: Secondary | ICD-10-CM

## 2022-02-17 DIAGNOSIS — R0602 Shortness of breath: Secondary | ICD-10-CM

## 2022-02-17 DIAGNOSIS — N185 Chronic kidney disease, stage 5: Secondary | ICD-10-CM

## 2022-02-17 DIAGNOSIS — Z515 Encounter for palliative care: Secondary | ICD-10-CM | POA: Diagnosis not present

## 2022-02-17 NOTE — Progress Notes (Signed)
? ? ?Manufacturing engineer ?Community Palliative Care Consult Note ?Telephone: [phone]  ?Fax: [phone]  ? ?Date of encounter: 02/17/22 ?9:54 AM ?PATIENT NAME: Carrie Craig ?[place] [address]   ?[phone] (home)  ?DOB: Apr 25, 1932 ?MRN: [id number] ?PRIMARY CARE PROVIDER:    ?Carrie Body, MD,  ?[place] ?[place] [address] ?[phone] ? ?REFERRING PROVIDER:   ?Carrie Body, MD ?[place] ?[facility] ?[place],  [place] [address] ?[phone] ? ?RESPONSIBLE PARTY:    ?Contact Information   ? ? Name Relation Home Work Mobile  ? Carrie Craig Brother [phone]  [phone]  ? ?  ? ? ? ?I met face to face with patient and family in the home. Palliative Care was asked to follow this patient by consultation request of  Carrie Body, MD to address advance care planning and complex medical decision making. This is the initial visit.  ? ? ?                                 ASSESSMENT AND PLAN / RECOMMENDATIONS:  ? ?Advance Care Planning/Goals of Care: Goals include to maximize quality of life and symptom management. Patient/health care surrogate gave his/her permission to discuss.Our advance care planning conversation included a discussion about:    ?The value and importance of advance care planning  ?Experiences with loved ones who have been seriously ill or have died  ?Exploration of personal, cultural or spiritual beliefs that might influence medical decisions  ?Exploration of goals of care in the event of a sudden injury or illness  ?MOST form reviewed; no changes. ?CODE STATUS: DNR ? ?Education provided on palliative medicine vs. Hospice services. Patient would like to improve, continue therapy. We discussed transitioning to hospice should she continue to decline.  ? ?I spent 20 minutes providing this consultation. More than 50% of the time in this consultation was spent in counseling and care  coordination. ?------------------------------------------------------------------------------------------------------------- ? ?Symptom Management/Plan: ? ?CKD 5-patient's GFR 10. She states she would not want to have dialysis. She has missed appointments with PCP and nephrology d/t weakness. Will see if [facility] SN or palliative nurse can obtain needed lab work. ? ?Generalized weakness-continue PT/OT as directed. Use walker for ambulation. Monitor for falls/safety.  ? ?Shortness of breath-secondary to acute respiratory failure, acute on chronic diastolic HF. Continue oxygen at 1 lpm via nasal canula.  ? ?Diastolic HF-continue torsemide, amlodipine as directed. Caregivers state her metoprolol was recently stopped d/t soft bp when having therapy. Her blood pressures have been 110-315 systolic past 2 days. 158/68 during visit. Caregiver instructed to administer metoprolol. Caregiver to contact PCP regarding blood pressures. Continue daily weights.  ? ?Follow up Palliative Care Visit: Palliative care will continue to follow for complex medical decision making, advance care planning, and clarification of goals. Return 4 weeks or prn. ? ?This visit was coded based on medical decision making (MDM). ? ?PPS: 40% ? ?HOSPICE ELIGIBILITY/DIAGNOSIS: TBD ? ?Chief Complaint: Palliative Medicine initial consult.  ? ?HISTORY OF PRESENT ILLNESS:  Carrie Craig is a 88 y.o. year old female  with diastolic heart failure, CKD 5, history of CVA, hypertension, mixed hyperlipidemia, type 2 diabetes, vitamin D deficiency, osteoporosis, anemia of chronic disease, multiple myeloma, chronic back pain. Patient was hospitalized 3/1 through 01/23/2022 due to acute respiratory failure with hypoxia, pulmonary edema, diastolic heart failure EF 60 to 65%. ? ?Patient resides at family care  home.  She is currently receiving home health occupational and physical therapy as well as skilled nurse services.  She denies pain; endorses shortness of breath with  exertion, sometimes at rest. She does endorse worsening weakness.  She uses walker for ambulation. She endorses good appetite. She is sleeping well at night. Brother states that she missed recent appointments with Carrie Craig and PCP due to weakness. A 10-point ROS is negative, except for the pertinent positives and negatives detailed per the HPI.  ? ?History obtained from review of EMR, discussion with primary team, and interview with family, facility staff/caregiver and/or Carrie Craig.  ?I reviewed available labs, medications, imaging, studies and related documents from the EMR.  Records reviewed and summarized above.  ? ?Physical Exam: ?Weight: 109 pounds ?Pulse 62, resp 18, b/p 158/68, sats 97% on 1 lpm ?Constitutional: NAD ?General: frail appearing, thin ?EYES: anicteric sclera, lids intact, no discharge  ?ENMT: intact hearing, oral mucous membranes moist, dentition intact ?CV: S1S2, RRR, no LE edema ?Pulmonary: LCTA, no increased work of breathing, no cough ?Abdomen: normo-active BS + 4 quadrants, soft and non tender, no ascites ?GU: deferred ?MSK: moves all extremities, ambulatory ?Skin: cool and dry, no rashes or wounds on visible skin ?Neuro:  + generalized weakness,  no cognitive impairment ?Psych: non-anxious affect, A and O x 3 ?Hem/lymph/immuno: no widespread bruising ?CURRENT PROBLEM LIST:  ?Patient Active Problem List  ? Diagnosis Date Noted  ? Acute on chronic diastolic CHF (congestive heart failure) ([place]) 01/19/2022  ? HTN (hypertension) 01/19/2022  ? Acute renal failure superimposed on stage 5 chronic kidney disease, not on chronic dialysis ([place]) 01/18/2022  ? Acute respiratory failure with hypoxia ([place]) 01/18/2022  ? Anemia of chronic disease 01/18/2022  ? Anemia due to stage 4 chronic kidney disease ([place]) 02/18/2020  ? Anemia due to stage 3 chronic kidney disease ([place]) 05/31/2018  ? UTI (urinary tract infection) 11/25/2015  ? Thoracic compression fracture ([place]) 11/25/2015  ? ?PAST MEDICAL HISTORY:   ?Active Ambulatory Problems  ?  Diagnosis Date Noted  ? UTI (urinary tract infection) 11/25/2015  ? Thoracic compression fracture ([place]) 11/25/2015  ? Anemia due to stage 3 chronic kidney disease ([place]) 05/31/2018  ? Anemia due to stage 4 chronic kidney disease ([place]) 02/18/2020  ? Acute renal failure superimposed on stage 5 chronic kidney disease, not on chronic dialysis ([place]) 01/18/2022  ? Acute respiratory failure with hypoxia ([place]) 01/18/2022  ? Anemia of chronic disease 01/18/2022  ? Acute on chronic diastolic CHF (congestive heart failure) ([place]) 01/19/2022  ? HTN (hypertension) 01/19/2022  ? ?Resolved Ambulatory Problems  ?  Diagnosis Date Noted  ? Intractable pain 11/24/2015  ? ?Past Medical History:  ?Diagnosis Date  ? Cancer [facility])   ? Diabetes mellitus without complication ([place])   ? GERD (gastroesophageal reflux disease)   ? Stroke [facility])   ? Wears dentures   ? ?SOCIAL HX:  ?Social History  ? ?Tobacco Use  ? Smoking status: Never  ? Smokeless tobacco: Never  ?Substance Use Topics  ? Alcohol use: No  ? ?FAMILY HX: No family history on file.   ? ?ALLERGIES: No Known Allergies   ?PERTINENT MEDICATIONS:  ?Outpatient Encounter Medications as of 02/17/2022  ?Medication Sig  ? albuterol (VENTOLIN HFA) 108 (90 Base) MCG/ACT inhaler Inhale 2 puffs into the lungs every 4 (four) hours as needed for wheezing or shortness of breath.  ? amLODipine (NORVASC) 10 MG tablet Take 1 tablet (10 mg total) by mouth daily.  ? aspirin  EC 81 MG tablet Take 81 mg by mouth daily.  ? atorvastatin (LIPITOR) 40 MG tablet Take 40 mg by mouth at bedtime.  ? benzonatate (TESSALON) 200 MG capsule Take 200 mg by mouth 3 (three) times daily as needed for cough.  ? Calcium Carbonate-Vitamin D3 600-400 MG-UNIT TABS Take 1 tablet by mouth 2 (two) times daily with a meal.  ? clopidogrel (PLAVIX) 75 MG tablet Take 75 mg by mouth daily.  ? dextromethorphan-guaiFENesin (MUCINEX DM) 30-600 MG 12hr tablet Take 1 tablet by mouth 2 (two) times daily as  needed for cough.  ? ferrous sulfate 325 (65 FE) MG tablet Take 325 mg by mouth every other day.  ? folic acid (FOLVITE) 1 MG tablet Take 1 tablet (1 mg total) by mouth daily. Can take any over-the-counter form.  ? loratadine (CLARI

## 2022-03-01 ENCOUNTER — Other Ambulatory Visit: Payer: Self-pay | Admitting: Student

## 2022-03-01 DIAGNOSIS — N185 Chronic kidney disease, stage 5: Secondary | ICD-10-CM

## 2022-03-01 DIAGNOSIS — R0602 Shortness of breath: Secondary | ICD-10-CM

## 2022-03-01 DIAGNOSIS — I5032 Chronic diastolic (congestive) heart failure: Secondary | ICD-10-CM

## 2022-03-01 DIAGNOSIS — Z515 Encounter for palliative care: Secondary | ICD-10-CM

## 2022-03-01 DIAGNOSIS — R531 Weakness: Secondary | ICD-10-CM

## 2022-03-01 NOTE — Progress Notes (Signed)
? ? ?Manufacturing engineer ?Community Palliative Care Consult Note ?Telephone: [phone]  ?Fax: [phone]  ? ? ?Date of encounter: 03/01/22 ?10:14 AM ?PATIENT NAME: Carrie Craig ?[place] [address]   ?[phone] (home)  ?DOB: 1932/05/24 ?MRN: [id number] ?PRIMARY CARE PROVIDER:    ?Carrie Body, MD,  ?[place] ?[place] [address] ?[phone] ? ?REFERRING PROVIDER:   ?Carrie Body, MD ?[place] ?[facility] ?[place],  [place] [address] ?[phone] ? ?RESPONSIBLE PARTY:    ?Contact Information   ? ? Name Relation Home Work Mobile  ? Craig,Carrie Brother [phone]  [phone]  ? ?  ? ? ? ?I met face to face with patient in the home. Palliative Care was asked to follow this patient by consultation request of  Carrie Body, MD to address advance care planning and complex medical decision making. This is a follow up visit. ? ?Discussed with brother Carrie Craig via telephone. ? ?                                 ASSESSMENT AND PLAN / RECOMMENDATIONS:  ? ?Advance Care Planning/Goals of Care: Goals include to maximize quality of life and symptom management. Patient/health care surrogate gave his/her permission to discuss. ?CODE STATUS: DNR ? ?Education on Palliative Medicine. Will continue to provide ongoing support, symptom management. Will monitor for changes and declines.  ? ?Symptom Management/Plan: ? ?Shortness of breath-secondary to acute respiratory failure, acute on chronic diastolic HF. Continue oxygen at 1 lpm PRN via nasal canula to keep her sats 90% or greater. Education provided to patient, family and care home staff. Patient is encouraged to take portable tanks when she goes out of the house.  ? ?Generalized weakness-continue PT/OT as directed. Use walker for ambulation. Monitor for falls/safety. ? ?CKD 5-patient's GFR 10. Follow up with nephrology as scheduled. Patient has declined dialysis.  ? ?Diastolic  HF-continue torsemide, amlodipine, metoprolol as directed. Continue daily weights. No lower extremity edema, no worsening of shortness of breath. Education on worsening symptoms.  ? ?Follow up Palliative Care Visit: Palliative care will continue to follow for complex medical decision making, advance care planning, and clarification of goals. Return in 4 weeks or prn. ? ? ?This visit was coded based on medical decision making (MDM). ? ?PPS: 40% ? ?HOSPICE ELIGIBILITY/DIAGNOSIS: TBD ? ?Chief Complaint: Palliative Medicine follow up visit; cough. ? ?HISTORY OF PRESENT ILLNESS:  Carrie Craig is a 88 y.o. year old female  with diastolic heart failure, CKD 5, history of CVA, hypertension, mixed hyperlipidemia, type 2 diabetes, vitamin D deficiency, osteoporosis, anemia of chronic disease, multiple myeloma, chronic back pain.  ? ?Patient resides at family care home. Patient's brother states she had a worsening cough and felt she was getting "too much oxygen."  Patient states her cough has improved. She is weighing daily; weight varies from 108-111 pounds. No worsening of shortness of breath. Denies LE edema. Endorses good appetite. She is sleeping well at night. Denies having any pain. Endorses weakness; still receiving therapy. A 10-point ROS is negative, except for the pertinent positives and negatives detailed per the HPI.  ? ?History obtained from review of EMR, discussion with primary team, and interview with family, facility staff/caregiver and/or Ms. Phillips.  ?I reviewed available labs, medications, imaging, studies and related documents from the EMR.  Records reviewed and summarized above.  ? ? ?Physical Exam: ?Pulse 58, resp  16,  B/P 131/69, sats 92% on room air, up to 97% at 1 lpm ?Constitutional: NAD ?General: frail appearing  ?EYES: anicteric sclera, lids intact, no discharge  ?ENMT: intact hearing, oral mucous membranes moist, dentition intact ?CV: S1S2, RRR, no LE edema ?Pulmonary: LCTA, no increased work of  breathing, non productive cough ?Abdomen:,normo-active BS + 4 quadrants, soft and non tender, no ascites ?GU: deferred ?MSK: moves all extremities, ambulatory ?Skin: warm and dry, no rashes or wounds on visible skin ?Neuro: + generalized weakness,  no cognitive impairment ?Psych: non-anxious affect, A and O x 3 ?Hem/lymph/immuno: no widespread bruising ? ? ?Thank you for the opportunity to participate in the care of [name].  The palliative care team will continue to follow. Please call our office at [phone] if we can be of additional assistance.  ? ?Carrie Slocumb, NP  ? ?COVID-19 PATIENT SCREENING TOOL ?Asked and negative response unless otherwise noted:  ? ?Have you had symptoms of covid, tested positive or been in contact with someone with symptoms/positive test in the past 5-10 days? No ? ?

## 2022-03-16 ENCOUNTER — Telehealth: Payer: Self-pay

## 2022-03-16 NOTE — Telephone Encounter (Signed)
830 am.  Message received from [facility], NP regarding blood work.  She has requested follow up with PCP.  Patient recently missed a visit due to weakness and will likely need blood work obtained.  ? ?922 am.  Spoke with patient's brother [name].  He advised patient did miss a visit for blood work to be obtained.  He advises follow up visit on Monday with PCP.  He believes this is a virtual visit and blood work will likely be needed. Advised that I would follow up with PCP office.   ? ?927 am.  Phone call made to PCP office with above message. ? ?936 am.  Incoming call from Amy, [place] for PCP.  New orders for fasting lipid panel, hepatic function panel, Hgb A1C and Vitamin D labs to be drawn prior to visit on Monday. ? ?Phone call made to [place].  Advised of above and visit is scheduled for tomorrow at 8 am.  Questioned if visit is virtual or in-person and brother advised virtual.  In speaking further with brother, he advised visit would be in-person and explained he did not clearly understand what a virtual visit meant.  ?

## 2022-03-17 ENCOUNTER — Other Ambulatory Visit: Payer: Self-pay | Admitting: Student

## 2022-03-17 ENCOUNTER — Other Ambulatory Visit: Payer: Self-pay

## 2022-03-17 DIAGNOSIS — G4700 Insomnia, unspecified: Secondary | ICD-10-CM

## 2022-03-17 DIAGNOSIS — R531 Weakness: Secondary | ICD-10-CM

## 2022-03-17 DIAGNOSIS — I5032 Chronic diastolic (congestive) heart failure: Secondary | ICD-10-CM

## 2022-03-17 DIAGNOSIS — N184 Chronic kidney disease, stage 4 (severe): Secondary | ICD-10-CM | POA: Diagnosis not present

## 2022-03-17 DIAGNOSIS — R0602 Shortness of breath: Secondary | ICD-10-CM

## 2022-03-17 DIAGNOSIS — D638 Anemia in other chronic diseases classified elsewhere: Secondary | ICD-10-CM | POA: Diagnosis not present

## 2022-03-17 DIAGNOSIS — I5030 Unspecified diastolic (congestive) heart failure: Secondary | ICD-10-CM | POA: Diagnosis not present

## 2022-03-17 DIAGNOSIS — R634 Abnormal weight loss: Secondary | ICD-10-CM

## 2022-03-17 DIAGNOSIS — Z515 Encounter for palliative care: Secondary | ICD-10-CM

## 2022-03-17 DIAGNOSIS — N185 Chronic kidney disease, stage 5: Secondary | ICD-10-CM

## 2022-03-17 NOTE — Progress Notes (Signed)
PATIENT NAME: Carrie Craig ?DOB: 08/27/1932 ?MRN: [id number] ? ?PRIMARY CARE PROVIDER: Dion Body, MD ? ?RESPONSIBLE PARTY:  ?Acct ID - Guarantor Home Phone Work Phone Relationship Acct Type  ?[id number] - Dingee,Jin [phone]  Self P/F  ?   [address] [place], [place] [address]  ? ? ?Visit made to obtain blood work on patient as ordered by Dr. Netty Starring.  Patient found at the breakfast table but had not eaten yet.  Blood work taken from right [facility] without difficulty.  Blood taken to [facility] on [address] for processing.  Results to be faxed to Dr. Raylene Miyamoto office for review.  Patient will see provider in-person on Monday.   ? ?Patient in need of either a wheelchair or transport chair due to weakness.  Charlann Boxer, NP to see patient this am and is updated on need.  ? ? ? ?Lorenza Burton, RN ? ?

## 2022-03-17 NOTE — Progress Notes (Signed)
? ? ?Manufacturing engineer ?Community Palliative Care Consult Note ?Telephone: [phone]  ?Fax: [phone]  ? ? ?Date of encounter: 03/17/22 ?9:56 AM ?PATIENT NAME: Carrie Craig ?[place] [address]   ?[phone] (home)  ?DOB: Dec 12, 1931 ?MRN: [id number] ?PRIMARY CARE PROVIDER:    ?Carrie Body, MD,  ?[place] ?[place] [address] ?[phone] ? ?REFERRING PROVIDER:   ?Carrie Body, MD ?[place] ?[facility] ?[place],  [place] [address] ?[phone] ? ?RESPONSIBLE PARTY:    ?Contact Information   ? ? Name Relation Home Work Mobile  ? Carrie Craig,Carrie Brother [phone]  [phone]  ? ?  ? ? ? ?I met face to face with patient  in the home. Palliative Care was asked to follow this patient by consultation request of  Carrie Body, MD to address advance care planning and complex medical decision making. This is a follow up visit. ? ?                                 ASSESSMENT AND PLAN / RECOMMENDATIONS:  ? ?Advance Care Planning/Goals of Care: Goals include to maximize quality of life and symptom management. Patient/health care surrogate gave his/her permission to discuss. ? ?CODE STATUS: DNR ? ?Symptom Management/Plan: ? ?Shortness of breath-endorses shortness of breath with exertion; currently wearing oxygen PRN at 1 lpm to keep sats 90% or greater.  ? ?Diastolic HF-no edema, endorses shortness of breath with exertion and fatigue. Continue daily weights. Continue torsemide, amlodipine, metoprolol as directed. ? ?CKD 5-patient's GFR 10. Follow up with nephrology as scheduled on 03/20/22. Patient has declined dialysis.  ? ?Weight loss-patient reports good appetite although she is losing weight. Weight 105.6 pounds; daily weights. Continue eating foods she enjoys; monitor sodium intake.  ? ?Generalized weakness-continue PT/OT as directed. Patient has difficulty ambulating distances long distances and fatigues easily. Recommend  transport w/c for locomotion.  ? ?DME Recommendation: ? ?Transport wheel chair -Patient requires a transport chair due to diagnoses of diastolic congestive heart failure.  Patient has a mobility limitation that prevents her from completing ADLs.  Patient is unable to safely ambulate 100 feet with a rolling walker.  Patient is unable to self prepare a wheelchair and requires a transport chair.  Patient has a caregiver who is willing and able and available to provide assistance with the transport chair. ? ?Insomnia-increase trazodone to 75 mg QHS; script sent to pharmacy.  ? ?Follow up Palliative Care Visit: Palliative care will continue to follow for complex medical decision making, advance care planning, and clarification of goals. Return in 4-6 weeks or prn. ? ? ?This visit was coded based on medical decision making (MDM). ? ?PPS: 40% ? ?HOSPICE ELIGIBILITY/DIAGNOSIS: TBD ? ?Chief Complaint: Palliative Medicine follow up visit.  ? ?HISTORY OF PRESENT ILLNESS:  Carrie Craig is a 88 y.o. year old female  with diastolic heart failure, CKD 5, history of CVA, hypertension, mixed hyperlipidemia, type 2 diabetes, vitamin D deficiency, osteoporosis, anemia of chronic disease, multiple myeloma, chronic back pain.  ? ?Patient feeling better overall. She is still receiving therapy. She has shortness of breath with exertion, fatigues easily and is unable to ambulate long distances. She continues on daily weights; she states her appetite has been good, but endorses some weight loss. Reports having increased difficulty sleeping; currently taking trazodone 50 mg QHS. A 10-point ROS is negative, except for the pertinent positives and  negatives detailed per the HPI. ? ?History obtained from review of EMR, discussion with primary team, and interview with family, facility staff/caregiver and/or Carrie Craig.  ?I reviewed available labs, medications, imaging, studies and related documents from the EMR.  Records reviewed and summarized  above.  ? ?Physical Exam: ?Pulse 60, resp 16, b/p 145/65, sats 96% on room air ?Constitutional: NAD ?General: frail appearing, thin ?EYES: anicteric sclera, lids intact, no discharge  ?ENMT: intact hearing, oral mucous membranes moist, dentition intact ?CV: S1S2, RRR, no LE edema ?Pulmonary: LCTA, bases slightly diminished, no increased work of breathing, no cough ?Abdomen: normo-active BS + 4 quadrants, soft and non tender, no ascites ?GU: deferred ?MSK: moves all extremities, ambulatory ?Skin: warm and dry, no rashes or wounds on visible skin ?Neuro: + generalized weakness,  no cognitive impairment ?Psych: non-anxious affect, A and O x 3 ?Hem/lymph/immuno: no widespread bruising ? ? ?Thank you for the opportunity to participate in the care of Carrie Craig.  The palliative care team will continue to follow. Please call our office at [phone] if we can be of additional assistance.  ? ?Carrie Slocumb, NP  ? ?COVID-19 PATIENT SCREENING TOOL ?Asked and negative response unless otherwise noted:  ? ?Have you had symptoms of covid, tested positive or been in contact with someone with symptoms/positive test in the past 5-10 days? No ? ?

## 2022-03-20 DIAGNOSIS — N184 Chronic kidney disease, stage 4 (severe): Secondary | ICD-10-CM | POA: Diagnosis not present

## 2022-03-20 DIAGNOSIS — I130 Hypertensive heart and chronic kidney disease with heart failure and stage 1 through stage 4 chronic kidney disease, or unspecified chronic kidney disease: Secondary | ICD-10-CM | POA: Diagnosis not present

## 2022-03-20 DIAGNOSIS — I5032 Chronic diastolic (congestive) heart failure: Secondary | ICD-10-CM | POA: Diagnosis not present

## 2022-03-20 DIAGNOSIS — C9000 Multiple myeloma not having achieved remission: Secondary | ICD-10-CM | POA: Diagnosis not present

## 2022-03-20 DIAGNOSIS — E782 Mixed hyperlipidemia: Secondary | ICD-10-CM | POA: Diagnosis not present

## 2022-03-20 DIAGNOSIS — Z515 Encounter for palliative care: Secondary | ICD-10-CM | POA: Diagnosis not present

## 2022-03-20 DIAGNOSIS — Z8673 Personal history of transient ischemic attack (TIA), and cerebral infarction without residual deficits: Secondary | ICD-10-CM | POA: Diagnosis not present

## 2022-03-31 ENCOUNTER — Other Ambulatory Visit: Payer: Self-pay | Admitting: Primary Care

## 2022-03-31 VITALS — BP 140/60 | HR 57 | Temp 98.6°F | Wt 104.0 lb

## 2022-03-31 DIAGNOSIS — Z515 Encounter for palliative care: Secondary | ICD-10-CM

## 2022-03-31 DIAGNOSIS — R0602 Shortness of breath: Secondary | ICD-10-CM

## 2022-03-31 DIAGNOSIS — I5032 Chronic diastolic (congestive) heart failure: Secondary | ICD-10-CM

## 2022-03-31 DIAGNOSIS — N185 Chronic kidney disease, stage 5: Secondary | ICD-10-CM

## 2022-03-31 NOTE — Progress Notes (Signed)
? ? ?Manufacturing engineer ?Community Palliative Care Consult Note ?Telephone: [phone]  ?Fax: [phone]  ? ? ?Date of encounter: 03/31/22 ?9:10 AM ?PATIENT NAME: Carrie Craig ?[place] [address]   ?[phone] (home)  ?DOB: 06/11/1932 ?MRN: [id number] ?PRIMARY CARE PROVIDER:    ?Carrie Body, MD,  ?[place] ?[place] [address] ?[phone] ? ?REFERRING PROVIDER:   ?Carrie Body, MD ?Carrie Craig ?[facility] ?[place],  [place] [address] ?[phone] ? ?RESPONSIBLE PARTY:    ?Contact Information   ? ? Name Relation Home Work Mobile  ? Carrie Craig,Carrie Craig Brother [phone]  [phone]  ? ?  ? ?I connected with  Carrie Craig on 03/31/22 by a video enabled telemedicine application and verified that I am speaking with the correct person using two identifiers. ?  ?I discussed the limitations of evaluation and management by telemedicine. The patient expressed understanding and agreed to proceed.  ? ?I met face to face with patient and family in home connecting virtually with Carrie Gather, RN.  Palliative Care was asked to follow this patient by consultation request of  Carrie Body, MD to address advance care planning and complex medical decision making. This is a follow up visit. ? ?                                 ASSESSMENT AND PLAN / RECOMMENDATIONS:  ? ?Advance Care Planning/Goals of Care: Goals include to maximize quality of life and symptom management. Patient/health care surrogate gave his/her permission to discuss. ?Our advance care planning conversation included a discussion about:    ?The value and importance of advance care planning  ?Experiences with loved ones who have been seriously ill or have died  ?Exploration of personal, cultural or spiritual beliefs that might influence medical decisions  ?Exploration of goals of care in the event of a sudden injury or illness  ?Identification of a healthcare agent  ?Review  of an  advance directive document . ?Decision not to resuscitate-already in place.  ?Brother states to RN that hospice is an option, and we will assess after current regimen is followed. ?CODE STATUS: DNR-Uploaded to Vynca ? ?Symptom Management/Plan: ? ?Abnormal weight loss:  Trending downward since hospitalization on 01/18/22.  Patient is being weighed daily by facility staff.  Appetite is good per patient.  She endorses eating 3 meals a day and also has snacks at her bedside. 100% of breakfast consumed on today's visit. 12% loss but edema should be considered. ? ?01/18/22 weight 118 lbs-per hospital records ?02/01/22 weight 109.8 lbs ?03/20/22 weight 107 lbs ?03/30/22 weight 104.0 lbs ?Albumin 3.4 on 09/14/21 ?BMI: 22.5 ? ?Functional Decline:  Patient is requiring more assistance with sit to stand.  Has a rolling walker in her room.  Only able to walk a few steps to her chair early this morning.   Able to ambulate to the bathroom across the hall with 1 person assistance. Gait is unsteady and patient dyspneic with exertion.   Patient is at high risk for falls.  We discussed the use of a transport chair for safety and energy conservation.  Patient is in agreeable.  She has a willing and able caregiver. Chair  was ordered last month but patient's brother declined.  Spoke with brother who advised patient has declined in function since last week.  Last week patient was able to ambulate with her walker and navigate  stairs with her brother. Education provided on purpose of the transport chair.  Carrie Craig has agreed to have this ordered. ? ?Goals of Care:  Lengthy conversation with brother Carrie Craig.  He confirmed patient's MOST form and DNR status decisions were made by patient.  Goal is for patient to remain comfortable avoid hospitalizations if possible.  Carrie Craig is understanding of patient's dx of CKD and HF.  We briefly discussed hospice and Carrie Craig would like to see if today's interventions will help.  He acknowledges patient is having  good/bad days now and again confirms desire is for patient to remain comfortable.  He is open to hospice support when patient is deemed appropriate.  ? ?Shortness of Breath:  Worsening shortness of breath at rest and with exertion.  Currently on O2 @ 1 L via [place] continuous.  Rhonchi present to bilateral upper lobes.  Productive cough with white sputum production noted.  Will start Azithromycin 250 mg day #1 and 125 mg day for 4 more days and will give 1 extra dose of torsemide 20 mg.    This is in line with renal dosing, spoke with sam at [facility]. Reviewed MAR and patient is continuing with Torsemide 20 mg daily. Education provided on use of albuterol inhaler for shortness of breath or wheezing, currently on MAR. ? ?Production manager member Carrie Craig updated. ? ?Follow up Palliative Care Visit: Palliative care will continue to follow for complex medical decision making, advance care planning, and clarification of goals. Return 1-2 weeks or prn. ? ?This visit was coded based on medical decision making (MDM). ? ?PPS: 40% ? ?HOSPICE ELIGIBILITY/DIAGNOSIS: TBD ? ?Chief Complaint: SOB. DOE ?  ?  ?HISTORY OF PRESENT ILLNESS:  Carrie Craig is a 88 y.o. year old female  with diastolic heart failure, CKD 5, history of CVA, hypertension, mixed hyperlipidemia, type 2 diabetes, vitamin D deficiency, osteoporosis, anemia of chronic disease, multiple myeloma, chronic back pain. ? ?History obtained from review of EMR, discussion with primary team, and interview with family, facility staff/caregiver and/or [name].  ?I reviewed available labs, medications, imaging, studies and related documents from the EMR.  Records reviewed and summarized above.  ? ?ROS ? ?General: NAD ?EYES: denies vision changes ?ENMT: denies dysphagia ?Cardiovascular: denies chest pain, endorsed DOE ?Pulmonary: + cough, + increased SOB ?Abdomen: endorses good appetite, denies constipation, endorses continence of bowel ?GU: denies dysuria, endorses continence  of urine ?MSK:  + increased weakness,  no falls reported, at risk due to debility ?Skin: denies rashes or wounds ?Neurological: denies pain, denies insomnia ?Psych: Endorses positive mood ?Heme/lymph/immuno: denies bruises, abnormal bleeding ? ?Physical Exam: ?Current and past weights:  see above ?Constitutional: NAD ?General: frail appearing, thin pleasant female ?EYES: anicteric sclera, lids intact, no discharge  ?ENMT: intact hearing, oral mucous membranes moist, dentition intact ?CV: RRR, no LE edema ?Pulmonary: Rhonchi present to bilateral upper lobes, no increased work of breathing, + cough, O2 @ 1 L via [place]. ?Abdomen: intake 75-100%, normo-active BS + 4 quadrants, soft and non tender, no ascites ?GU: deferred ?MSK: + sarcopenia, moves all extremities, ambulatory with walker and 1 person assistance ?Skin: warm and dry, no rashes or wounds on visible skin ?Neuro:  +  generalized weakness,  no cognitive impairment ?Psych: non-anxious affect, A and O x 3 ?Hem/lymph/immuno: no widespread bruising ? ? ?Thank you for the opportunity to participate in the care of Ms. Cullins.  The palliative care team will continue to follow. Please call our office at [phone] if we  can be of additional assistance.  ? ?Lorenza Burton, RN  ? ?COVID-19 PATIENT SCREENING TOOL ?Asked and negative response unless otherwise noted:  ? ?Have you had symptoms of covid, tested positive or been in contact with someone with symptoms/positive test in the past 5-10 days?   ?

## 2022-04-06 ENCOUNTER — Telehealth: Payer: Self-pay

## 2022-04-06 ENCOUNTER — Other Ambulatory Visit: Payer: Self-pay | Admitting: Student

## 2022-04-06 DIAGNOSIS — Z515 Encounter for palliative care: Secondary | ICD-10-CM

## 2022-04-06 DIAGNOSIS — R0602 Shortness of breath: Secondary | ICD-10-CM

## 2022-04-06 DIAGNOSIS — N185 Chronic kidney disease, stage 5: Secondary | ICD-10-CM

## 2022-04-06 DIAGNOSIS — E46 Unspecified protein-calorie malnutrition: Secondary | ICD-10-CM

## 2022-04-06 DIAGNOSIS — R051 Acute cough: Secondary | ICD-10-CM

## 2022-04-06 DIAGNOSIS — R634 Abnormal weight loss: Secondary | ICD-10-CM

## 2022-04-06 DIAGNOSIS — J988 Other specified respiratory disorders: Secondary | ICD-10-CM

## 2022-04-06 NOTE — Telephone Encounter (Signed)
305 pm.  Incoming call from Amy with Dr. Raylene Miyamoto office.  Yes for hospice referral, yes PCP will serve as AOR for patient and yes PCP agrees patient is terminally ill with a 6 month or less prognosis.   Charlann Boxer, NP updated.  Referral Intake notified of hospice referral.

## 2022-04-06 NOTE — Progress Notes (Signed)
Designer, [facility]ellery Palliative Care Consult Note Telephone: [phone]  Fax: [phone]    Date of encounter: 04/06/22 9:19 AM PATIENT NAME: Carrie Craig [address] [place] [address]   [phone] (home)  DOB: 10/14/1932 MRN: [id number] PRIMARY CARE PROVIDER:    Dion Body, MD,  Carrie Craig [place] [address] [phone]  REFERRING PROVIDER:   Dion Body, MD [facility] [place],  [place] [address] [phone]  RESPONSIBLE PARTY:    Contact Information     Name Relation Home Work Mobile   Carrie Craig,Van Brother [phone]  [phone]        I met face to face with patient and caregiver in the home. Palliative Care was asked to follow this patient by consultation request of  Carrie Body, MD to address advance care planning and complex medical decision making. This is a follow up visit.  Discussed with patient's brother Carrie Craig via telephone.                                    ASSESSMENT AND PLAN / RECOMMENDATIONS:   Advance Care Planning/Goals of Care: Goals include to maximize quality of life and symptom management. Patient/health care surrogate gave his/her permission to discuss. Our advance care planning conversation included a discussion about:    The value and importance of advance care planning  Experiences with loved ones who have been seriously ill or have died  Exploration of personal, cultural or spiritual beliefs that might influence medical decisions  Exploration of goals of care in the event of a sudden injury or illness  CODE STATUS: DNR  Education provided on Palliative Medicine vs. Hospice services. Patient continues to lose weight despite of eating well. She has increased weakness, still with respiratory infection. Patient is adamant that she will not receive dialysis. She wants to limit hospitalization if possible and manage in the home.  Given her overall condition, will discuss with medical director  regarding referral for hospice evaluation. Patient and her brother are in agreement.   Symptom Management/Plan:  Respiratory infection-patient continues to have rhonchi bilaterally, worsening cough; cough is productive with white phlegm. Shortness of breath with exertion. She endorses still not feeling well. She was prescribed Azithromycin last week. Will start doxycycline 100 mg BID x 7 days, Mucinex DM BID for cough.    Shortness of breath-secondary to HF. Endorses shortness of breath with exertion, ambulating from bed to chair. Continue wearing oxygen at 1 lpm PRN.   CKD 5-patient's GFR is 10; she continues to decline dialysis. Will refer for hospice evaluation.   Abnormal weight loss, protein calorie malnutrition-patient continues to lose weight in spite of eating well. Weight is down to 100.8 pounds; she was 104 pounds 1 week ago. 118 pounds 2 months ago. Albumin 3.1. Encourage nutritional supplement if not eating well.   Follow up Palliative Care Visit: Palliative care will continue to follow for complex medical decision making, advance care planning, and clarification of goals. Return 1 weeks or prn.   This visit was coded based on medical decision making (MDM).  PPS: 40%  HOSPICE ELIGIBILITY/DIAGNOSIS: CKD 5, Heart failure.  Chief Complaint: Palliative Medicine follow up visit.   HISTORY OF PRESENT ILLNESS:  Carrie Craig is a 88 y.o. year old female  with diastolic heart failure, CKD 5, history of CVA, respiratory failure, hypertension, mixed hyperlipidemia, type  2 diabetes, vitamin D deficiency, osteoporosis, anemia of chronic disease, multiple myeloma, chronic back pain.   Patient continues with worsening cough; she reports still not feeling well. She endorses congestion; cough productive with white sputum. No fever or chills; she does endorse shortness of breath with exertion. Increased weakness, more tired and  fatigued. She is still losing weight; although she endorses a good appetite. A 10-point ROS is negative, except for the pertinent positives and negatives detailed per the HPI.  History obtained from review of EMR, discussion with primary team, and interview with family, facility staff/caregiver and/or Carrie Craig.  I reviewed available labs, medications, imaging, studies and related documents from the EMR.  Records reviewed and summarized above.    Physical Exam: Pulse 64, resp 20, b/p 148/61, sats 96% on room air Constitutional: NAD General: frail appearing, thin, ill appearing EYES: anicteric sclera, lids intact, no discharge  ENMT: intact hearing, oral mucous membranes moist, dentition intact CV: S1S2, RRR, no LE edema Pulmonary: Rhonchi bilaterally, increased work of breathing with exertion, + cough, room air Abdomen: normo-active BS + 4 quadrants, soft and non tender, no ascites GU: deferred MSK: + sarcopenia, moves all extremities, ambulatory Skin: cool and dry, pale, no rashes or wounds on visible skin Neuro: + generalized weakness,  no cognitive impairment Psych: non-anxious affect, A and O x 3 Hem/lymph/immuno: no widespread bruising   Thank you for the opportunity to participate in the care of Carrie Craig.  The palliative care team will continue to follow. Please call our office at [phone] if we can be of additional assistance.   Carrie Slocumb, NP   COVID-19 PATIENT SCREENING TOOL Asked and negative response unless otherwise noted:   Have you had symptoms of covid, tested positive or been in contact with someone with symptoms/positive test in the past 5-10 days? No

## 2022-04-06 NOTE — Telephone Encounter (Signed)
247 pm.  Incoming call from [facility], NP.   Patient continues to loose weight despite having a good appetite.  Weight is down to 100 lbs.  Continues with respiratory infection.  Patient and brother [name] are agreeable to a hospice assessment.  NP requested PCP office be contacted for hospice referral.  Phone call made to PCP office request for hospice referral, if PCP will continue to serve as attending of record while patient is under hospice and if PCP agrees patient has a 6 month or less life expectancy.  Call back number and fax number provided to [facility].

## 2022-04-14 ENCOUNTER — Other Ambulatory Visit: Payer: Self-pay | Admitting: Student

## 2022-04-18 DIAGNOSIS — D631 Anemia in chronic kidney disease: Secondary | ICD-10-CM | POA: Diagnosis not present

## 2022-04-18 DIAGNOSIS — K219 Gastro-esophageal reflux disease without esophagitis: Secondary | ICD-10-CM | POA: Diagnosis not present

## 2022-04-18 DIAGNOSIS — J9611 Chronic respiratory failure with hypoxia: Secondary | ICD-10-CM | POA: Diagnosis not present

## 2022-04-18 DIAGNOSIS — I132 Hypertensive heart and chronic kidney disease with heart failure and with stage 5 chronic kidney disease, or end stage renal disease: Secondary | ICD-10-CM | POA: Diagnosis not present

## 2022-04-18 DIAGNOSIS — N185 Chronic kidney disease, stage 5: Secondary | ICD-10-CM | POA: Diagnosis not present

## 2022-04-18 DIAGNOSIS — E785 Hyperlipidemia, unspecified: Secondary | ICD-10-CM | POA: Diagnosis not present

## 2022-04-18 DIAGNOSIS — I5032 Chronic diastolic (congestive) heart failure: Secondary | ICD-10-CM | POA: Diagnosis not present

## 2022-07-11 DIAGNOSIS — D631 Anemia in chronic kidney disease: Secondary | ICD-10-CM | POA: Diagnosis not present

## 2022-07-11 DIAGNOSIS — K219 Gastro-esophageal reflux disease without esophagitis: Secondary | ICD-10-CM | POA: Diagnosis not present

## 2022-07-11 DIAGNOSIS — Z9981 Dependence on supplemental oxygen: Secondary | ICD-10-CM | POA: Diagnosis not present

## 2022-07-11 DIAGNOSIS — E785 Hyperlipidemia, unspecified: Secondary | ICD-10-CM | POA: Diagnosis not present

## 2022-07-11 DIAGNOSIS — I69318 Other symptoms and signs involving cognitive functions following cerebral infarction: Secondary | ICD-10-CM | POA: Diagnosis not present

## 2022-07-11 DIAGNOSIS — I132 Hypertensive heart and chronic kidney disease with heart failure and with stage 5 chronic kidney disease, or end stage renal disease: Secondary | ICD-10-CM | POA: Diagnosis not present

## 2022-07-11 DIAGNOSIS — I5032 Chronic diastolic (congestive) heart failure: Secondary | ICD-10-CM | POA: Diagnosis not present

## 2022-07-11 DIAGNOSIS — J9611 Chronic respiratory failure with hypoxia: Secondary | ICD-10-CM | POA: Diagnosis not present

## 2022-07-11 DIAGNOSIS — N185 Chronic kidney disease, stage 5: Secondary | ICD-10-CM | POA: Diagnosis not present

## 2022-08-27 ENCOUNTER — Emergency Department
Admission: EM | Admit: 2022-08-27 | Discharge: 2022-08-27 | Disposition: A | Discharge status: Home / Self Care | Attending: Emergency Medicine | Admitting: Emergency Medicine

## 2022-08-27 ENCOUNTER — Emergency Department

## 2022-08-27 ENCOUNTER — Other Ambulatory Visit: Payer: Self-pay

## 2022-08-27 ENCOUNTER — Encounter: Payer: Self-pay | Admitting: Emergency Medicine

## 2022-08-27 DIAGNOSIS — M4312 Spondylolisthesis, cervical region: Secondary | ICD-10-CM | POA: Diagnosis not present

## 2022-08-27 DIAGNOSIS — S42295A Other nondisplaced fracture of upper end of left humerus, initial encounter for closed fracture: Secondary | ICD-10-CM | POA: Insufficient documentation

## 2022-08-27 DIAGNOSIS — R519 Headache, unspecified: Secondary | ICD-10-CM | POA: Insufficient documentation

## 2022-08-27 DIAGNOSIS — S199XXA Unspecified injury of neck, initial encounter: Secondary | ICD-10-CM | POA: Diagnosis not present

## 2022-08-27 DIAGNOSIS — E049 Nontoxic goiter, unspecified: Secondary | ICD-10-CM | POA: Diagnosis not present

## 2022-08-27 DIAGNOSIS — S4992XA Unspecified injury of left shoulder and upper arm, initial encounter: Secondary | ICD-10-CM | POA: Diagnosis present

## 2022-08-27 DIAGNOSIS — M25511 Pain in right shoulder: Secondary | ICD-10-CM | POA: Diagnosis not present

## 2022-08-27 DIAGNOSIS — S59911A Unspecified injury of right forearm, initial encounter: Secondary | ICD-10-CM | POA: Diagnosis not present

## 2022-08-27 DIAGNOSIS — W19XXXA Unspecified fall, initial encounter: Secondary | ICD-10-CM | POA: Diagnosis not present

## 2022-08-27 DIAGNOSIS — S2231XA Fracture of one rib, right side, initial encounter for closed fracture: Secondary | ICD-10-CM | POA: Diagnosis not present

## 2022-08-27 DIAGNOSIS — N189 Chronic kidney disease, unspecified: Secondary | ICD-10-CM | POA: Diagnosis not present

## 2022-08-27 DIAGNOSIS — S0990XA Unspecified injury of head, initial encounter: Secondary | ICD-10-CM | POA: Diagnosis not present

## 2022-08-27 DIAGNOSIS — I6529 Occlusion and stenosis of unspecified carotid artery: Secondary | ICD-10-CM | POA: Diagnosis not present

## 2022-08-27 DIAGNOSIS — N185 Chronic kidney disease, stage 5: Secondary | ICD-10-CM | POA: Diagnosis not present

## 2022-08-27 DIAGNOSIS — I129 Hypertensive chronic kidney disease with stage 1 through stage 4 chronic kidney disease, or unspecified chronic kidney disease: Secondary | ICD-10-CM | POA: Diagnosis not present

## 2022-08-27 DIAGNOSIS — I672 Cerebral atherosclerosis: Secondary | ICD-10-CM | POA: Diagnosis not present

## 2022-08-27 LAB — CBC WITH DIFFERENTIAL/PLATELET
Abs Immature Granulocytes: 0.06 10*3/uL (ref 0.00–0.07)
Basophils Absolute: 0.1 10*3/uL (ref 0.0–0.1)
Basophils Relative: 1 %
Eosinophils Absolute: 0.3 10*3/uL (ref 0.0–0.5)
Eosinophils Relative: 3 %
HCT: 30 % — ABNORMAL LOW (ref 36.0–46.0)
Hemoglobin: 9.8 g/dL — ABNORMAL LOW (ref 12.0–15.0)
Immature Granulocytes: 1 %
Lymphocytes Relative: 11 %
Lymphs Abs: 1.2 10*3/uL (ref 0.7–4.0)
MCH: 31.2 pg (ref 26.0–34.0)
MCHC: 32.7 g/dL (ref 30.0–36.0)
MCV: 95.5 fL (ref 80.0–100.0)
Monocytes Absolute: 0.5 10*3/uL (ref 0.1–1.0)
Monocytes Relative: 4 %
Neutro Abs: 8.8 10*3/uL — ABNORMAL HIGH (ref 1.7–7.7)
Neutrophils Relative %: 80 %
Platelets: 248 10*3/uL (ref 150–400)
RBC: 3.14 MIL/uL — ABNORMAL LOW (ref 3.87–5.11)
RDW: 13.2 % (ref 11.5–15.5)
WBC: 11 10*3/uL — ABNORMAL HIGH (ref 4.0–10.5)
nRBC: 0 % (ref 0.0–0.2)

## 2022-08-27 LAB — COMPREHENSIVE METABOLIC PANEL
ALT: 12 U/L (ref 0–44)
AST: 12 U/L — ABNORMAL LOW (ref 15–41)
Albumin: 3.9 g/dL (ref 3.5–5.0)
Alkaline Phosphatase: 77 U/L (ref 38–126)
Anion gap: 10 (ref 5–15)
BUN: 79 mg/dL — ABNORMAL HIGH (ref 8–23)
CO2: 20 mmol/L — ABNORMAL LOW (ref 22–32)
Calcium: 9.7 mg/dL (ref 8.9–10.3)
Chloride: 106 mmol/L (ref 98–111)
Creatinine, Ser: 6.68 mg/dL — ABNORMAL HIGH (ref 0.44–1.00)
GFR, Estimated: 6 mL/min — ABNORMAL LOW (ref >60)
Glucose, Bld: 151 mg/dL — ABNORMAL HIGH (ref 70–99)
Potassium: 4.7 mmol/L (ref 3.5–5.1)
Sodium: 136 mmol/L (ref 135–145)
Total Bilirubin: 0.5 mg/dL (ref 0.3–1.2)
Total Protein: 8.1 g/dL (ref 6.5–8.1)

## 2022-08-27 MED ORDER — OXYCODONE HCL 5 MG PO TABS
2.5000 mg | ORAL_TABLET | Freq: Once | ORAL | Status: AC
  Administered 2022-08-27: 2.5 mg via ORAL
  Filled 2022-08-27: qty 1

## 2022-08-27 MED ORDER — OXYCODONE HCL 5 MG PO TABS: 2.5000 mg | ORAL_TABLET | Freq: Four times a day (QID) | ORAL | 0 refills | Status: AC | PRN

## 2022-08-27 MED ORDER — ACETAMINOPHEN 500 MG PO TABS
1000.0000 mg | ORAL_TABLET | Freq: Once | ORAL | Status: AC
  Administered 2022-08-27: 1000 mg via ORAL
  Filled 2022-08-27: qty 2

## 2022-08-27 NOTE — ED Triage Notes (Signed)
Pt in via EMS from [facility] care with c/o fall. Pt with unwitnessed fall, pt hit had on night stand, no LOC, takes plavix. Pt c/o pain to right arm, no deformities.   Mr. Carrie Craig [phone], call for transport back to facility/home

## 2022-08-27 NOTE — Discharge Instructions (Addendum)
Her kidney function is worse but pt prefers to continue hospice care and declined IV fluids given this is temporary or ever wanting dialysis.  Continue to try to eat plenty of food and drink water or Pedialyte or Gatorade to help with any dehydration.  She does have a humerus fracture.  She should take Tylenol 1 g every 8 hours and oxycodone for breakthrough pain. Call ortho to make follow up. She should be in a wheelchair since she will not be able to use walker.  IMPRESSION: 1. No evidence of acute intracranial abnormality. Atrophy and chronic small-vessel white matter ischemic changes. 2. 6 mm high LEFT frontal extra-axial mass, likely a meningioma. No adjacent parenchymal edema. 3. SUPERIOR endplate compression fractures of T1 (25%) and T2 (50%), age indeterminate but more likely subacute to remote. Correlate with pain. No paraspinal inflammation or bony retropulsion. 4. No evidence of acute cervical spine fracture. Multilevel degenerative changes as described.

## 2022-08-27 NOTE — ED Triage Notes (Signed)
Pt states she was trying to get out of bed and fell. Pt reports only pain is to her right forearm. Pt denies LOC, denies pain else where.

## 2022-08-27 NOTE — ED Provider Notes (Signed)
[facility] Provider Note    Event Date/Time   First MD Initiated Contact with Patient 08/27/22 [phone]     (approximate)   History   Fall and Arm Injury   HPI  Carrie Craig is a 88 y.o. female  with history of CKD who comes in with fall and arm injury.  I called the facility who states that they were making breakfast and they heard a thud and then when they came over they found patient laying on the ground.  Patient is not sure why she fell.  She does not really remember but denies any LOC.  She does think she might of hit her head and has pain on her right shoulder.  Denies any chest pain, abdominal pain or any other concerns  Physical Exam   Triage Vital Signs: ED Triage Vitals  Enc Vitals Group     BP 08/27/22 0808 (!) 155/67     Pulse Rate 08/27/22 0808 (!) 59     Resp 08/27/22 0808 17     Temp 08/27/22 0808 97.6 F (36.4 C)     Temp Source 08/27/22 0808 Oral     SpO2 08/27/22 0808 100 %     Weight --      Height --      Head Circumference --      Peak Flow --      Pain Score 08/27/22 0810 10     Pain Loc --      Pain Edu? --      Excl. in [place]? --     Most recent vital signs: Vitals:   08/27/22 0808  BP: (!) 155/67  Pulse: (!) 59  Resp: 17  Temp: 97.6 F (36.4 C)  SpO2: 100%     General: Awake, no distress.  CV:  Good peripheral perfusion.  Resp:  Normal effort.  Abd:  No distention.  Other:  No chest wall tenderness.  No abdominal tenderness.  Able to lift both legs up off the bed.  She is got some right shoulder tenderness distal pulse intact.  Radial, ulnar, median nerve intact.  Sensation intact.   ED Results / Procedures / Treatments   Labs (all labs ordered are listed, but only abnormal results are displayed) Labs Reviewed  CBC WITH DIFFERENTIAL/PLATELET - Abnormal; Notable for the following components:      Result Value   WBC 11.0 (*)    RBC 3.14 (*)    Hemoglobin 9.8 (*)    HCT 30.0 (*)    Neutro Abs 8.8 (*)     All other components within normal limits  COMPREHENSIVE METABOLIC PANEL - Abnormal; Notable for the following components:   CO2 20 (*)    Glucose, Bld 151 (*)    BUN 79 (*)    Creatinine, Ser 6.68 (*)    AST 12 (*)    GFR, Estimated 6 (*)    All other components within normal limits     EKG  My interpretation of EKG:  Normal sinus  brady rate of 59 without any ST elevation or T wave inversions, normal intervals  RADIOLOGY I have reviewed the xray personally and interpreted and she has a right humeral head fracture  PROCEDURES:  Critical Care performed: No  Procedures   MEDICATIONS ORDERED IN ED: Medications  acetaminophen (TYLENOL) tablet 1,000 mg (1,000 mg Oral Given 08/27/22 1012)     IMPRESSION / MDM / ASSESSMENT AND PLAN / ED COURSE  I reviewed  the triage vital signs and the nursing notes.   Patient's presentation is most consistent with acute presentation with potential threat to life or bodily function.   Patient comes in with a fall possible hit her head is on Plavix.  CT head ordered evaluate for any intracranial hemorrhage, cervical fracture as well as an x-ray ordered from triage of the forearm however when I evaluated patient it was her right shoulder that was in pain.  Patient is able to lift up both legs up off the bed so doubt hip fracture.  She is only reporting pain in the right shoulder  Patient has a right humeral neck fracture.  CT head shows some compression fractures but patient has no tenderness on this area so unlikely to be acute.  There is also noted to be a meningioma which I did discuss with family about.  Patient was placed in a sling.  I was able to discuss with the facility and they will be able to take care of patient and know that she needs to be in a wheelchair given she would not be able to ambulate with a sling on.  Patient was given orthopedic number for follow-up.  Patient CMP is slightly elevated at 6.68 and her CBC shows a stable  hemoglobin.  I reviewed patient's note from 8/22 where patient has known stage V kidney disease  Discussed with patient and her niece who is at bedside.  Patient typically makes all of her own decisions at this time.  She states that she would never want dialysis and we discussed IV fluid but she understands that this could just be temporary and is declining.  Patient is on hospice and would prefer to focus on comfort and going back to her facility.  At this time she declines any other interventions.  We discussed pain control and doing some oxycodone and there is increased risk for falls but given she will need to be in the wheelchair they would like to proceed with this.  They prefer to go back to facility.  Attempted to call patient's POA just to update him as well to make sure everyone is in agreement but he did not pick up but the family member said that that she would update the POA.       FINAL CLINICAL IMPRESSION(S) / ED DIAGNOSES   Final diagnoses:  Stage 5 chronic kidney disease not on chronic dialysis ([place])  Other closed nondisplaced fracture of proximal end of left humerus, initial encounter     Rx / DC Orders   ED Discharge Orders          Ordered    oxyCODONE (ROXICODONE) 5 MG immediate release tablet  Every 6 hours PRN        08/27/22 1134             Note:  This document was prepared using Dragon voice recognition software and may include unintentional dictation errors.   Vanessa , MD 08/27/22 1134

## 2022-08-27 NOTE — ED Notes (Signed)
Provider at bedside with pt

## 2022-09-04 ENCOUNTER — Telehealth: Payer: Self-pay

## 2022-09-04 NOTE — Telephone Encounter (Signed)
     Patient  visit on 10/8  at [facility] you been able to follow up with your primary care physician? yes  The patient was or was not abl e to obtain any needed medicine or equipment. yes  Are there diet recommendations that you are having difficulty following? na  Patient expresses understanding of discharge instructions and education provided has no other needs at this time.  [place], [facility], Care Management  [phone] [address], [place], [place] [address] Phone: [phone] Email: [email or web address]

## 2022-09-07 ENCOUNTER — Encounter: Payer: Self-pay | Admitting: Internal Medicine

## 2022-09-20 DEATH — deceased

## 2023-02-26 IMAGING — CR DG CHEST 2V
1 series · 2 of 2 positions shown · non-contrast
Comparison: Chest x-ray 09/14/2021.

CLINICAL DATA: 89-year-old female with history of shortness of
breath, cough and congestion.

EXAM:
CHEST - 2 VIEW

[Series 1: dg chest 2 view · 0.14mm/px · 2 of 2 slices shown]
[im 1/2]
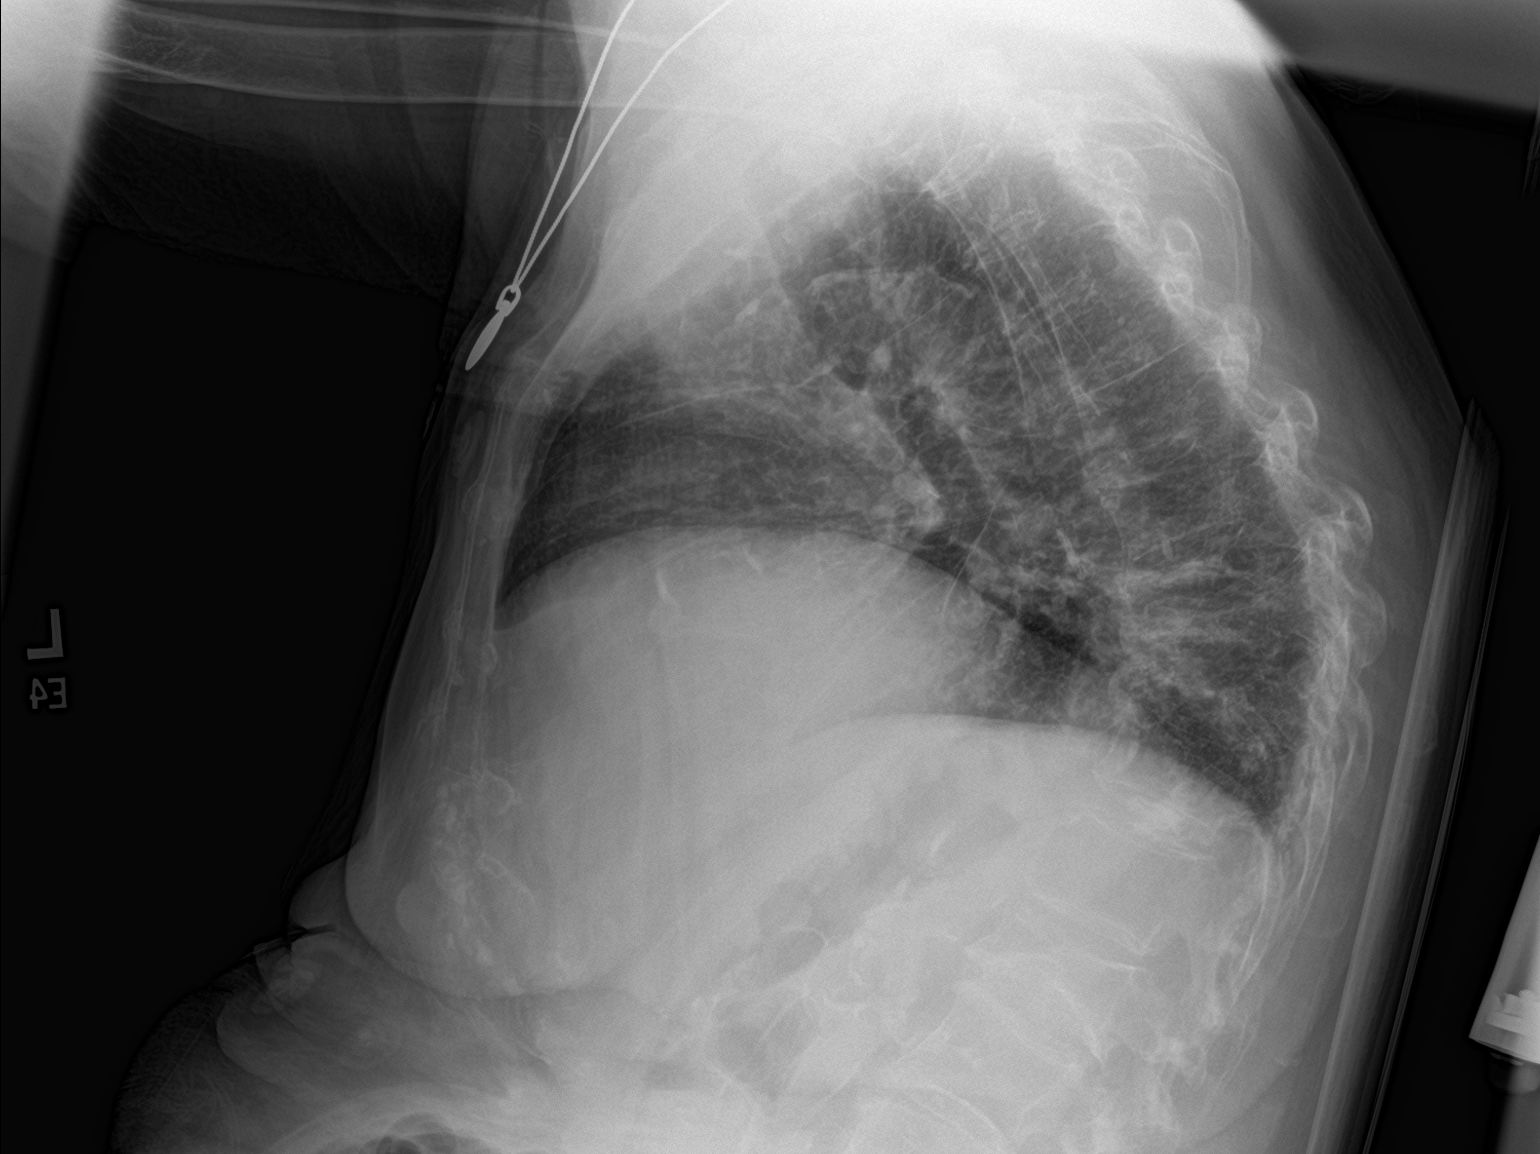
[im 2/2]
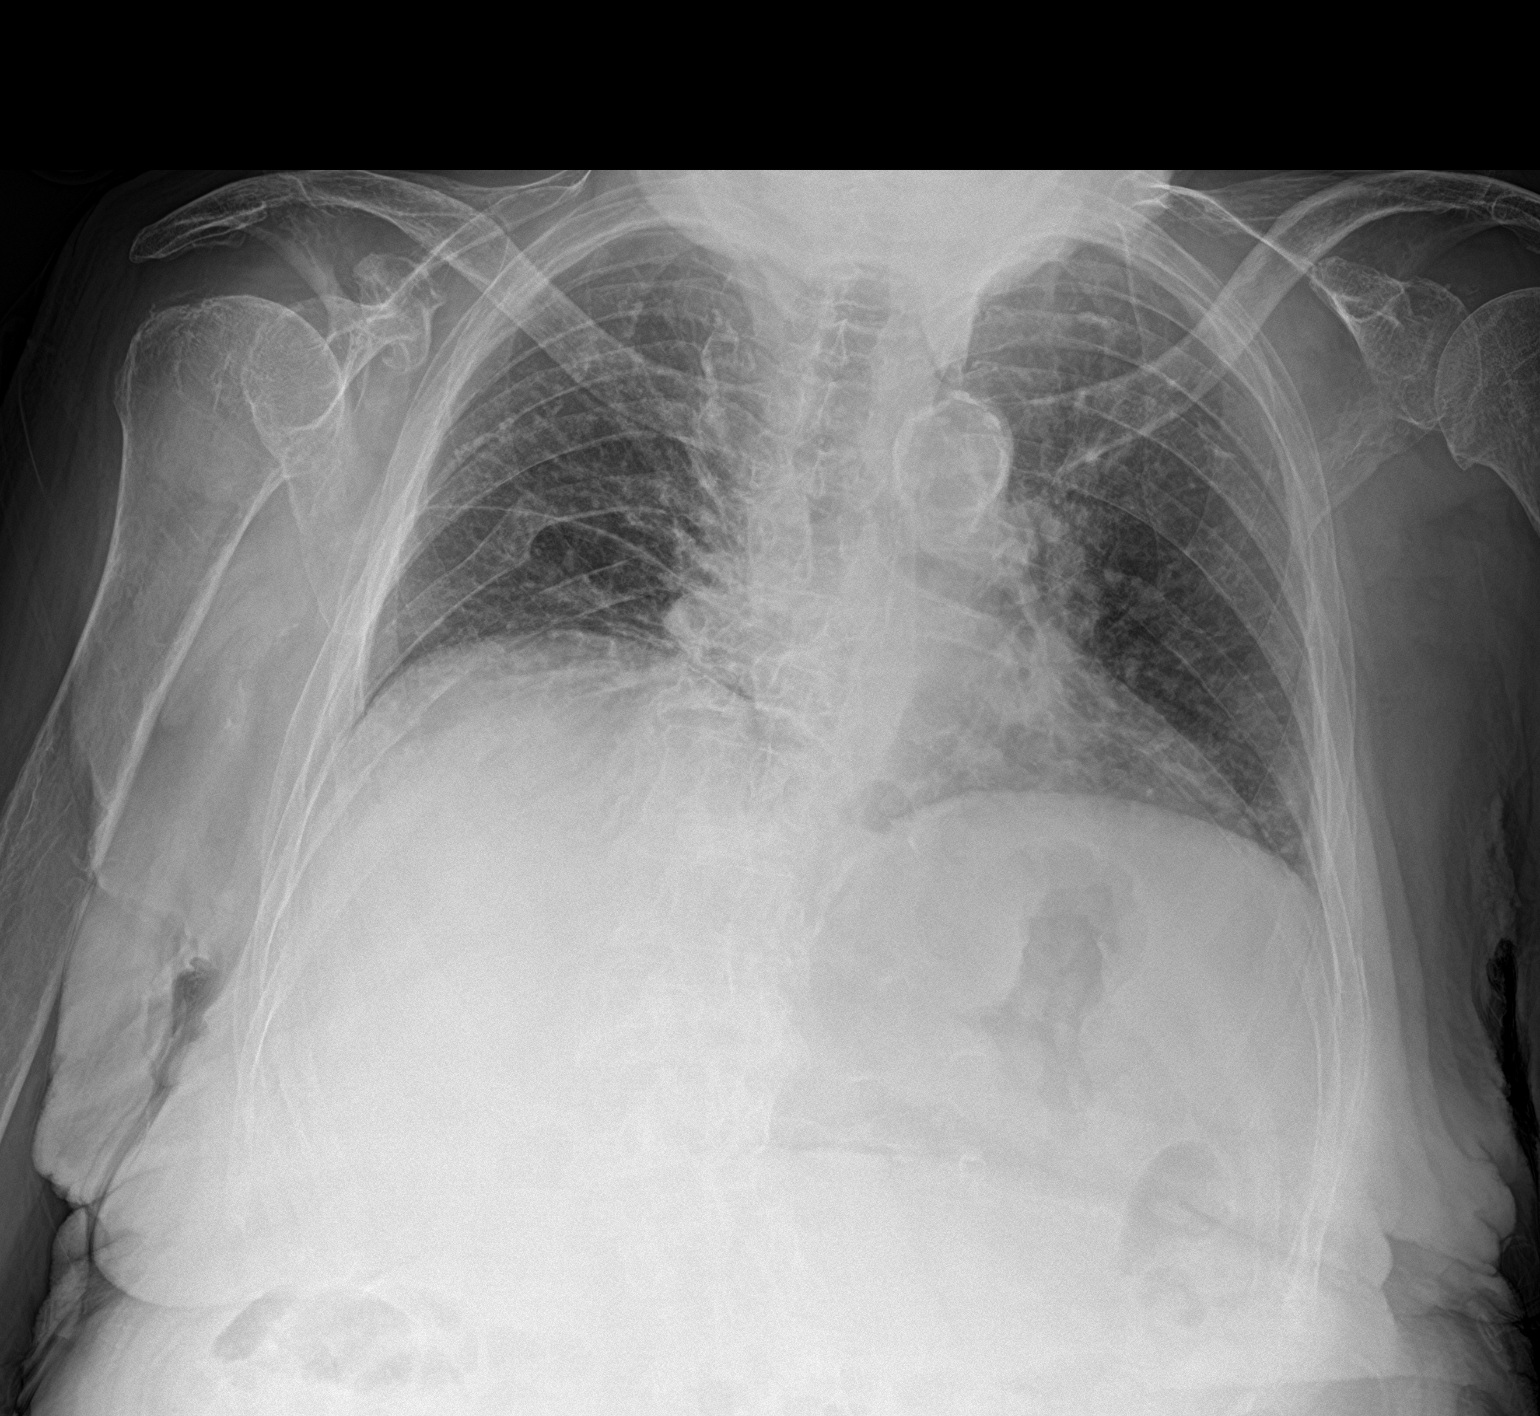

[2 of 2 positions shown; findings below may reference images not displayed]

FINDINGS: Lung volumes are very low. No consolidative airspace disease. No
pleural effusions. No pneumothorax. No pulmonary nodule or mass
noted. Pulmonary vasculature and the cardiomediastinal silhouette
are within normal limits. Atherosclerosis in the thoracic aorta. Old
healed right-sided rib fracture incidentally noted.
IMPRESSION: 1. Low lung volumes without radiographic evidence of acute
cardiopulmonary disease.
2. Aortic atherosclerosis.
